# Patient Record
Sex: Male | Born: 1950
Health system: Southern US, Community
[De-identification: ages and names within clinical notes are randomized; demographics above are authoritative.]

## PROBLEM LIST (undated history)

## (undated) DIAGNOSIS — F419 Anxiety disorder, unspecified: Secondary | ICD-10-CM

## (undated) DIAGNOSIS — E785 Hyperlipidemia, unspecified: Secondary | ICD-10-CM

## (undated) DIAGNOSIS — N200 Calculus of kidney: Secondary | ICD-10-CM

## (undated) DIAGNOSIS — G4730 Sleep apnea, unspecified: Secondary | ICD-10-CM

## (undated) DIAGNOSIS — K219 Gastro-esophageal reflux disease without esophagitis: Secondary | ICD-10-CM

## (undated) HISTORY — DX: Calculus of kidney: N20.0

## (undated) HISTORY — PX: EYE SURGERY: SHX253

## (undated) HISTORY — PX: TONSILLECTOMY: SUR1361

---

## 2002-02-11 ENCOUNTER — Ambulatory Visit (HOSPITAL_COMMUNITY): Admission: RE | Admit: 2002-02-11 | Discharge: 2002-02-11 | Payer: Self-pay | Admitting: Family Medicine

## 2002-02-11 ENCOUNTER — Encounter: Payer: Self-pay | Admitting: Family Medicine

## 2002-06-18 ENCOUNTER — Ambulatory Visit (HOSPITAL_COMMUNITY): Admission: RE | Admit: 2002-06-18 | Discharge: 2002-06-18 | Payer: Self-pay | Admitting: Internal Medicine

## 2004-03-06 ENCOUNTER — Ambulatory Visit (HOSPITAL_COMMUNITY): Admission: RE | Admit: 2004-03-06 | Discharge: 2004-03-06 | Payer: Self-pay | Admitting: Family Medicine

## 2004-03-06 ENCOUNTER — Ambulatory Visit: Payer: Self-pay | Admitting: *Deleted

## 2004-03-07 ENCOUNTER — Ambulatory Visit (HOSPITAL_COMMUNITY): Admission: RE | Admit: 2004-03-07 | Discharge: 2004-03-07 | Payer: Self-pay | Admitting: Family Medicine

## 2004-03-14 ENCOUNTER — Ambulatory Visit: Payer: Self-pay | Admitting: Cardiology

## 2004-03-14 ENCOUNTER — Encounter (HOSPITAL_COMMUNITY): Admission: RE | Admit: 2004-03-14 | Discharge: 2004-04-13 | Payer: Self-pay | Admitting: *Deleted

## 2004-03-19 ENCOUNTER — Ambulatory Visit: Payer: Self-pay | Admitting: *Deleted

## 2006-01-02 ENCOUNTER — Ambulatory Visit: Payer: Self-pay | Admitting: Internal Medicine

## 2006-01-02 ENCOUNTER — Ambulatory Visit (HOSPITAL_COMMUNITY): Admission: RE | Admit: 2006-01-02 | Discharge: 2006-01-02 | Payer: Self-pay | Admitting: Internal Medicine

## 2010-03-27 ENCOUNTER — Emergency Department (HOSPITAL_COMMUNITY)
Admission: EM | Admit: 2010-03-27 | Discharge: 2010-03-27 | Disposition: A | Discharge status: Home / Self Care | Payer: BC Managed Care – PPO | Attending: Emergency Medicine | Admitting: Emergency Medicine

## 2010-03-27 ENCOUNTER — Emergency Department (HOSPITAL_COMMUNITY): Payer: BC Managed Care – PPO

## 2010-03-27 DIAGNOSIS — Z79899 Other long term (current) drug therapy: Secondary | ICD-10-CM | POA: Insufficient documentation

## 2010-03-27 DIAGNOSIS — R109 Unspecified abdominal pain: Secondary | ICD-10-CM | POA: Insufficient documentation

## 2010-03-27 DIAGNOSIS — N201 Calculus of ureter: Secondary | ICD-10-CM | POA: Insufficient documentation

## 2010-03-27 DIAGNOSIS — Z7982 Long term (current) use of aspirin: Secondary | ICD-10-CM | POA: Insufficient documentation

## 2010-03-27 DIAGNOSIS — E7800 Pure hypercholesterolemia, unspecified: Secondary | ICD-10-CM | POA: Insufficient documentation

## 2010-03-27 LAB — COMPREHENSIVE METABOLIC PANEL
ALT: 21 U/L (ref 0–53)
CO2: 29 mEq/L (ref 19–32)
Chloride: 103 mEq/L (ref 96–112)
GFR calc Af Amer: 47 mL/min — ABNORMAL LOW (ref >60)
Potassium: 4.3 mEq/L (ref 3.5–5.1)

## 2010-03-27 LAB — CBC
Hemoglobin: 14.4 g/dL (ref 13.0–17.0)
MCH: 28.6 pg (ref 26.0–34.0)
MCHC: 33.4 g/dL (ref 30.0–36.0)
MCV: 85.7 fL (ref 78.0–100.0)
Platelets: 313 10*3/uL (ref 150–400)
RBC: 5.03 MIL/uL (ref 4.22–5.81)
RDW: 13 % (ref 11.5–15.5)
WBC: 13 10*3/uL — ABNORMAL HIGH (ref 4.0–10.5)

## 2010-03-27 LAB — DIFFERENTIAL
Basophils Relative: 0 % (ref 0–1)
Lymphocytes Relative: 17 % (ref 12–46)
Monocytes Absolute: 1.3 10*3/uL — ABNORMAL HIGH (ref 0.1–1.0)
Neutro Abs: 9.4 10*3/uL — ABNORMAL HIGH (ref 1.7–7.7)

## 2010-03-27 LAB — URINE MICROSCOPIC-ADD ON

## 2010-03-27 LAB — URINALYSIS, ROUTINE W REFLEX MICROSCOPIC
Bilirubin Urine: NEGATIVE
Glucose, UA: NEGATIVE mg/dL
Ketones, ur: NEGATIVE mg/dL
Leukocytes, UA: NEGATIVE
Nitrite: NEGATIVE
Protein, ur: NEGATIVE mg/dL

## 2010-04-05 ENCOUNTER — Ambulatory Visit (HOSPITAL_COMMUNITY)
Admission: RE | Admit: 2010-04-05 | Discharge: 2010-04-05 | Disposition: A | Discharge status: Home / Self Care | Payer: BC Managed Care – PPO | Source: Ambulatory Visit | Attending: Internal Medicine | Admitting: Internal Medicine

## 2010-04-05 ENCOUNTER — Other Ambulatory Visit (HOSPITAL_COMMUNITY): Payer: Self-pay | Admitting: Internal Medicine

## 2010-04-05 DIAGNOSIS — R911 Solitary pulmonary nodule: Secondary | ICD-10-CM

## 2010-04-05 DIAGNOSIS — J984 Other disorders of lung: Secondary | ICD-10-CM | POA: Insufficient documentation

## 2010-10-31 ENCOUNTER — Other Ambulatory Visit (HOSPITAL_COMMUNITY): Payer: Self-pay | Admitting: Internal Medicine

## 2010-10-31 DIAGNOSIS — R222 Localized swelling, mass and lump, trunk: Secondary | ICD-10-CM

## 2010-11-02 ENCOUNTER — Ambulatory Visit (HOSPITAL_COMMUNITY)
Admission: RE | Admit: 2010-11-02 | Discharge: 2010-11-02 | Disposition: A | Discharge status: Home / Self Care | Payer: BC Managed Care – PPO | Source: Ambulatory Visit | Attending: Internal Medicine | Admitting: Internal Medicine

## 2010-11-02 DIAGNOSIS — Z09 Encounter for follow-up examination after completed treatment for conditions other than malignant neoplasm: Secondary | ICD-10-CM | POA: Insufficient documentation

## 2010-11-02 DIAGNOSIS — R222 Localized swelling, mass and lump, trunk: Secondary | ICD-10-CM

## 2010-11-02 DIAGNOSIS — J984 Other disorders of lung: Secondary | ICD-10-CM | POA: Insufficient documentation

## 2010-12-26 ENCOUNTER — Other Ambulatory Visit (INDEPENDENT_AMBULATORY_CARE_PROVIDER_SITE_OTHER): Payer: Self-pay | Admitting: *Deleted

## 2010-12-26 ENCOUNTER — Telehealth (INDEPENDENT_AMBULATORY_CARE_PROVIDER_SITE_OTHER): Payer: Self-pay | Admitting: *Deleted

## 2010-12-26 NOTE — Telephone Encounter (Signed)
PCP/Requesting MD:  Sherwood Gambler   Name: TALAL FRITCHMAN "RANDY"  DOB: 06/24/2050  Home Phone:       Procedure: EGD/ED  Reason/Indication:  DYSPHAGIA  Has patient had this procedure before?  YES  If so, when, by whom and where?  2010  Is there a family history of colon cancer?    Who?  What age when diagnosed?    Is patient diabetic?   NO      Does patient have prosthetic heart valve?  NO  Do you have a pacemaker?  NO  Has patient had joint replacement within last 12 months?  NO  Is patient on Coumadin, Plavix and/or Aspirin? NO  Medications: LIPITOR 20 MG DILY  Allergies: PCN  Pharmacy:   Medication Adjustment: NONE  Procedure date & time: 12/28/10 @ 215

## 2010-12-27 ENCOUNTER — Encounter (HOSPITAL_COMMUNITY): Payer: Self-pay | Admitting: Pharmacy Technician

## 2010-12-27 MED ORDER — SODIUM CHLORIDE 0.45 % IV SOLN
Freq: Once | INTRAVENOUS | Status: AC
  Administered 2010-12-28: 1000 mL via INTRAVENOUS

## 2010-12-28 ENCOUNTER — Encounter (HOSPITAL_COMMUNITY): Payer: Self-pay | Admitting: *Deleted

## 2010-12-28 ENCOUNTER — Ambulatory Visit (HOSPITAL_COMMUNITY)
Admission: RE | Admit: 2010-12-28 | Discharge: 2010-12-28 | Disposition: A | Discharge status: Home / Self Care | Payer: BC Managed Care – PPO | Source: Ambulatory Visit | Attending: Internal Medicine | Admitting: Internal Medicine

## 2010-12-28 ENCOUNTER — Other Ambulatory Visit (INDEPENDENT_AMBULATORY_CARE_PROVIDER_SITE_OTHER): Payer: Self-pay | Admitting: Internal Medicine

## 2010-12-28 ENCOUNTER — Encounter (HOSPITAL_COMMUNITY)
Admission: RE | Disposition: A | Discharge status: Home / Self Care | Payer: Self-pay | Source: Ambulatory Visit | Attending: Internal Medicine

## 2010-12-28 DIAGNOSIS — K222 Esophageal obstruction: Secondary | ICD-10-CM | POA: Insufficient documentation

## 2010-12-28 DIAGNOSIS — K2980 Duodenitis without bleeding: Secondary | ICD-10-CM | POA: Insufficient documentation

## 2010-12-28 DIAGNOSIS — K449 Diaphragmatic hernia without obstruction or gangrene: Secondary | ICD-10-CM | POA: Insufficient documentation

## 2010-12-28 DIAGNOSIS — K2940 Chronic atrophic gastritis without bleeding: Secondary | ICD-10-CM | POA: Insufficient documentation

## 2010-12-28 DIAGNOSIS — E785 Hyperlipidemia, unspecified: Secondary | ICD-10-CM | POA: Insufficient documentation

## 2010-12-28 DIAGNOSIS — K219 Gastro-esophageal reflux disease without esophagitis: Secondary | ICD-10-CM | POA: Insufficient documentation

## 2010-12-28 DIAGNOSIS — R1310 Dysphagia, unspecified: Secondary | ICD-10-CM | POA: Insufficient documentation

## 2010-12-28 HISTORY — DX: Hyperlipidemia, unspecified: E78.5

## 2010-12-28 HISTORY — DX: Gastro-esophageal reflux disease without esophagitis: K21.9

## 2010-12-28 HISTORY — DX: Sleep apnea, unspecified: G47.30

## 2010-12-28 SURGERY — ESOPHAGOGASTRODUODENOSCOPY (EGD) WITH ESOPHAGEAL DILATION
Anesthesia: Moderate Sedation

## 2010-12-28 MED ORDER — STERILE WATER FOR IRRIGATION IR SOLN
Status: DC | PRN
  Administered 2010-12-28: 14:00:00

## 2010-12-28 MED ORDER — MEPERIDINE HCL 25 MG/ML IJ SOLN
INTRAMUSCULAR | Status: DC | PRN
  Administered 2010-12-28 (×2): 25 mg via INTRAVENOUS

## 2010-12-28 MED ORDER — MEPERIDINE HCL 50 MG/ML IJ SOLN
INTRAMUSCULAR | Status: AC
  Filled 2010-12-28: qty 1

## 2010-12-28 MED ORDER — BUTAMBEN-TETRACAINE-BENZOCAINE 2-2-14 % EX AERO
INHALATION_SPRAY | CUTANEOUS | Status: DC | PRN
  Administered 2010-12-28: 2 via TOPICAL

## 2010-12-28 MED ORDER — MIDAZOLAM HCL 5 MG/5ML IJ SOLN
INTRAMUSCULAR | Status: AC
  Filled 2010-12-28: qty 10

## 2010-12-28 MED ORDER — MIDAZOLAM HCL 5 MG/5ML IJ SOLN
INTRAMUSCULAR | Status: DC | PRN
  Administered 2010-12-28: 2 mg via INTRAVENOUS
  Administered 2010-12-28: 3 mg via INTRAVENOUS
  Administered 2010-12-28: 2 mg via INTRAVENOUS

## 2010-12-28 MED ORDER — PANTOPRAZOLE SODIUM 40 MG PO TBEC: 40.0000 mg | DELAYED_RELEASE_TABLET | Freq: Every day | ORAL | Status: DC

## 2010-12-28 NOTE — H&P (Signed)
James Norris is an 60 y.o. male.   Chief Complaint: Patient is here for EGD and ED. HPI: Patient is 60 year old Caucasian male with chronic GERD has not been taking his PPI regular basis and present for solid food dysphagia. He is having this symptom multiple times a week. She also having intermittent episodes where he has to regurgitate in order to get relief. His last esophageal dilation was over 2 years ago at [facility] in [facility]. Lately he has not experienced heartburn. He denies hoarseness chronic cough or sore throat. He has good appetite; no history of melena.  Past Medical History  Diagnosis Date  . Sleep apnea   . Chronic kidney disease     kidney stones  . GERD (gastroesophageal reflux disease)   . Hyperlipemia     Past Surgical History  Procedure Date  . Tonsillectomy   . Eye surgery     lasik- bilaterally    Family History  Problem Relation Age of Onset  . Dementia Mother   . Breast cancer Mother   . Coronary artery disease Mother   . Coronary artery disease Father   . Hypertension Father   . Diabetes type II Father   . Hyperlipidemia Father   . GER disease Father    Social History:  reports that he quit smoking about 4 months ago. His smoking use included Cigarettes. He smoked 1.5 packs per day. He does not have any smokeless tobacco history on file. He reports that he drinks alcohol. He reports that he does not use illicit drugs.  Allergies:  Allergies  Allergen Reactions  . Penicillins     Patient said he did not know if he was allergic or not, but his father never allowed any of them to take penicillin for personal reasons. His father was Dr. Derrel Nip.    Medications Prior to Admission  Medication Dose Route Frequency Provider Last Rate Last Dose  . 0.45 % sodium chloride infusion   Intravenous Once Malissa Hippo, MD 20 mL/hr at 12/28/10 1337 1,000 mL at 12/28/10 1337  . meperidine (DEMEROL) 50 MG/ML injection           . midazolam  (VERSED) 5 MG/5ML injection            No current outpatient prescriptions on file as of 12/28/2010.    No results found for this or any previous visit (from the past 48 hour(s)). No results found.  Review of Systems  Constitutional: Negative for weight loss.  Gastrointestinal: Negative for abdominal pain, diarrhea, constipation, blood in stool and melena.    Blood pressure 148/93, pulse 66, temperature 97.6 F (36.4 C), temperature source Oral, resp. rate 20, height 6' (1.829 m), weight 220 lb (99.791 kg), SpO2 97.00%. Physical Exam  Constitutional: He appears well-developed and well-nourished.  HENT:  Mouth/Throat: Oropharynx is clear and moist.  Eyes: Conjunctivae are normal. No scleral icterus.  Neck: No thyromegaly present.  Cardiovascular: Normal rate, regular rhythm and normal heart sounds.   No murmur heard. Respiratory: Effort normal and breath sounds normal.  GI: Soft. He exhibits no distension and no mass. There is no tenderness.  Musculoskeletal: He exhibits no edema.  Lymphadenopathy:    He has no cervical adenopathy.  Neurological: He is alert.  Skin: Skin is warm and dry.     Assessment/Plan Dysphagia in a patient with chronic GERD and known esophageal stricture.  EGD and possible ED  Mackenzy Eisenberg U 12/28/2010, 2:13 PM

## 2010-12-28 NOTE — Op Note (Addendum)
EGD PROCEDURE REPORT  PATIENT:  James Norris  MR#:  [id number] Birthdate:  [birth year], 60 y.o., male Endoscopist:  Dr. Malissa Hippo, MD Referred By:  Dr. Madelin Rear. Sherwood Gambler, MD Procedure Date: 12/28/2010  Procedure:   EGD with ED.  Indications:  Patient is 60 year old Caucasian male with chronic GERD who presents with solid food dysphagia occurring several times a week. He has history of esophageal stricture and was last dilated by me at [facility] in [place] for 2 years ago. He does not take his PPI as recommended.            Informed Consent:  Procedure and risks were reviewed with the patient and informed consent was obtained. Medications:  Demerol 50 mg IV Versed 7 mg IV Cetacaine spray topically for oropharyngeal anesthesia  Description of procedure:  The endoscope was introduced through the mouth and advanced to the second portion of the duodenum without difficulty or limitations. The mucosal surfaces were surveyed very carefully during advancement of the scope and upon withdrawal.  Findings:  Esophagus:  Mucosa of the esophagus was normal. Distal segment was tortuous. High grade stricture noted at GE junction initially dilated by passing the scope and subsequently with balloon dilator as below. GEJ:  37 cm Hiatus:  41 cm Stomach:  Stomach was empty and distended very well with insufflation. Folds in the proximal stomach were normal. Examination of mucosa revealed patchy linear erythema at antrum but no erosions or ulcers were noted. Pyloric channel was patent. Annularis fundus and cardia were examined by retroflexing the scope and were normal. Duodenum:  U bulbar erosions were noted. The scope was passed into second part of the duodenum there mucosa and folds and normal.  Therapeutic/Diagnostic Maneuvers Performed:  Stricture at GE junction was dilated with a balloon. Balloon dilator was passed with a scope. Guidewire was pushed the gastric lumen. Balloon dilator was positioned across  the stricture by withdrawn scope into the body of esophagus. It was dilated initially to 15 mm then to 16.5 and finally to 18 mm. Minimal oozing noted. As a balloon was withdrawn dysphagia was reexamined and I was able to pass the scope with minimal resistance. Wrap she was taken from GE junction rule out short segment Barrett's esophagus.  Complications:  None  Impression: High grade stricture at GE junction with the edges of esophagitis. Richard dilated with a balloon to 18 mm . Moderate size hiatal hernia. Nonerosive gastritis with erosive bulbar duodenitis.  Recommendations:  Anti-reflux measures reinforced. Pantoprazole 40 mg by mouth twice a day for 12 weeks and thereafter once a day. Will review old records and if he is not had H. pylori would consider doing so. I will be contacting patient with results of biopsy. Office visit in 6 months. REHMAN,NAJEEB U  12/28/2010  2:44 PM  CC: Dr. Cassell Smiles., MD, MD & Dr. Bonnetta Barry ref. provider found

## 2010-12-28 NOTE — Telephone Encounter (Signed)
approved

## 2011-01-14 ENCOUNTER — Encounter (INDEPENDENT_AMBULATORY_CARE_PROVIDER_SITE_OTHER): Payer: Self-pay | Admitting: *Deleted

## 2011-05-14 ENCOUNTER — Encounter (INDEPENDENT_AMBULATORY_CARE_PROVIDER_SITE_OTHER): Payer: Self-pay | Admitting: *Deleted

## 2011-05-20 ENCOUNTER — Other Ambulatory Visit (INDEPENDENT_AMBULATORY_CARE_PROVIDER_SITE_OTHER): Payer: Self-pay | Admitting: *Deleted

## 2011-05-22 ENCOUNTER — Ambulatory Visit (INDEPENDENT_AMBULATORY_CARE_PROVIDER_SITE_OTHER): Payer: BC Managed Care – PPO | Admitting: Internal Medicine

## 2011-07-01 ENCOUNTER — Ambulatory Visit (INDEPENDENT_AMBULATORY_CARE_PROVIDER_SITE_OTHER): Payer: BC Managed Care – PPO | Admitting: Internal Medicine

## 2012-02-10 ENCOUNTER — Encounter (INDEPENDENT_AMBULATORY_CARE_PROVIDER_SITE_OTHER): Payer: Self-pay | Admitting: Internal Medicine

## 2012-02-10 ENCOUNTER — Other Ambulatory Visit (INDEPENDENT_AMBULATORY_CARE_PROVIDER_SITE_OTHER): Payer: Self-pay | Admitting: *Deleted

## 2012-02-10 ENCOUNTER — Ambulatory Visit (INDEPENDENT_AMBULATORY_CARE_PROVIDER_SITE_OTHER): Payer: BC Managed Care – PPO | Admitting: Internal Medicine

## 2012-02-10 ENCOUNTER — Telehealth (INDEPENDENT_AMBULATORY_CARE_PROVIDER_SITE_OTHER): Payer: Self-pay | Admitting: *Deleted

## 2012-02-10 VITALS — BP 132/80 | HR 60 | Temp 97.9°F | Ht 72.0 in | Wt 240.0 lb

## 2012-02-10 DIAGNOSIS — R911 Solitary pulmonary nodule: Secondary | ICD-10-CM

## 2012-02-10 DIAGNOSIS — R195 Other fecal abnormalities: Secondary | ICD-10-CM

## 2012-02-10 DIAGNOSIS — Z1211 Encounter for screening for malignant neoplasm of colon: Secondary | ICD-10-CM

## 2012-02-10 DIAGNOSIS — E7800 Pure hypercholesterolemia, unspecified: Secondary | ICD-10-CM | POA: Insufficient documentation

## 2012-02-10 DIAGNOSIS — D649 Anemia, unspecified: Secondary | ICD-10-CM | POA: Insufficient documentation

## 2012-02-10 DIAGNOSIS — R1310 Dysphagia, unspecified: Secondary | ICD-10-CM

## 2012-02-10 DIAGNOSIS — R918 Other nonspecific abnormal finding of lung field: Secondary | ICD-10-CM | POA: Insufficient documentation

## 2012-02-10 MED ORDER — LANSOPRAZOLE 30 MG PO CPDR: 30.0000 mg | DELAYED_RELEASE_CAPSULE | Freq: Every day | ORAL | Status: DC

## 2012-02-10 MED ORDER — SIMVASTATIN 20 MG PO TABS: 20.0000 mg | ORAL_TABLET | Freq: Every evening | ORAL | Status: DC

## 2012-02-10 NOTE — Patient Instructions (Addendum)
Colonoscopy/EGD. 

## 2012-02-10 NOTE — Telephone Encounter (Signed)
Patient needs trilyte 

## 2012-02-10 NOTE — Progress Notes (Signed)
Subjective:     Patient ID: James Norris, male   DOB: 1950/04/19, 62 y.o.   MRN: [id number]  HPI Referred to our office by [name] for anemia and heme positive stool card. He denies seeing any blood. Appetite is good. No weight loss. He quit smoking 5 yrs. No abdominal pain.  BMs are black from the iron . BMs x 2 a day. Normal size. He does tell me he has diarrhea a lot lately. He tells me  He is having diarrhea every 3rd day. Rare etoh. Occasionally has dysphagia to chicken or spicy foods.  Hx of lung nodule and in need of surveillance  12/2010 EGD/ED: Impression:  High grade stricture at GE junction with the edges of esophagitis. Richard dilated with a balloon to 18 mm .  Moderate size hiatal hernia.  Nonerosive gastritis with erosive bulbar duodenitis.   Colonoscopy 06/2005 Dr. Cleotis Nipper for rectal bleeding: Normal colonoscopy. Internal hemorrhoids. 01/16/2008 EGD/ED:  Prominent Schatski's ring with small to moderate sized sliding hiatal hernia, but no evidence of erosiive esophagitis. Ring dilated/disrupted with balloon diliation to 19mm.  01/10/2012: RBC 4.61, Iron 14, UIBC 352, TIBC 366, % Sat 4, Vitamin B12 450, Ferritn 7 low, Folate greater than 20. 01/07/2012 H and H 11.4 and 36.0, MCV 77.6, Platelelt ct 30 10/30/2011 H and H 15.3, and 46.6, MCV 86.1, Platelet ct 307.  Review of Systems see hpi Current Outpatient Prescriptions  Medication Sig Dispense Refill  . aspirin 81 MG chewable tablet Chew 81 mg by mouth daily.        Marland Kitchen atorvastatin (LIPITOR) 20 MG tablet Take 20 mg by mouth daily.       . lansoprazole (PREVACID) 30 MG capsule Take 1 capsule (30 mg total) by mouth daily.  90 capsule  3  . pantoprazole (PROTONIX) 40 MG tablet Take 1 tablet (40 mg total) by mouth daily.  60 tablet  3  . simvastatin (ZOCOR) 20 MG tablet Take 1 tablet (20 mg total) by mouth every evening.  90 tablet  3   Past Medical History  Diagnosis Date  . Sleep apnea   . GERD (gastroesophageal  reflux disease)   . Hyperlipemia   . Kidney stone    Past Surgical History  Procedure Date  . Tonsillectomy   . Eye surgery     lasik- bilaterally   Allergies  Allergen Reactions  . Penicillins     Patient said he did not know if he was allergic or not, but his father never allowed any of them to take penicillin for personal reasons. His father was Dr. Derrel Nip.        Objective:   Physical Exam Filed Vitals:   02/10/12 1058  BP: 132/80  Pulse: 60  Temp: 97.9 F (36.6 C)  Height: 6' (1.829 m)  Weight: 240 lb (108.863 kg)    Alert and oriented. Skin warm and dry. Oral mucosa is moist.   . Sclera anicteric, conjunctivae is pink. Thyroid not enlarged. No cervical lymphadenopathy. Lungs clear. Heart regular rate and rhythm.  Abdomen is soft. Bowel sounds are positive. No hepatomegaly. No abdominal masses felt. No tenderness.  No edema to lower extremities. Patient is alert and oriented.      Assessment:   Anemia ( Iron deficiency anemia). Heme positive stools.Solid food dysphagia.  Esophageal stricture needs to be ruled out given his past hx of one.  PUD needs to be ruled. Colonic carcinoma also is in the differential. Lung nodule: in need  of surveillance.   Dr. Karilyn Cota in room with patient during office visit.    Plan:    CT chest for surveillande of lung nodule. EGD/ED and colonoscopy with Dr. Karilyn Cota.

## 2012-02-11 ENCOUNTER — Other Ambulatory Visit (INDEPENDENT_AMBULATORY_CARE_PROVIDER_SITE_OTHER): Payer: Self-pay | Admitting: Internal Medicine

## 2012-02-11 ENCOUNTER — Ambulatory Visit (HOSPITAL_COMMUNITY)
Admission: RE | Admit: 2012-02-11 | Discharge: 2012-02-11 | Disposition: A | Discharge status: Home / Self Care | Payer: BC Managed Care – PPO | Source: Ambulatory Visit | Attending: Internal Medicine | Admitting: Internal Medicine

## 2012-02-11 DIAGNOSIS — R911 Solitary pulmonary nodule: Secondary | ICD-10-CM

## 2012-02-11 MED ORDER — PEG 3350-KCL-NA BICARB-NACL 420 G PO SOLR: 4000.0000 mL | Freq: Once | ORAL | Status: DC

## 2012-02-24 ENCOUNTER — Encounter (HOSPITAL_COMMUNITY): Payer: Self-pay | Admitting: Pharmacy Technician

## 2012-02-27 ENCOUNTER — Encounter (INDEPENDENT_AMBULATORY_CARE_PROVIDER_SITE_OTHER): Payer: Self-pay

## 2012-03-11 ENCOUNTER — Ambulatory Visit (HOSPITAL_COMMUNITY)
Admission: RE | Admit: 2012-03-11 | Discharge: 2012-03-11 | Disposition: A | Discharge status: Home / Self Care | Payer: BC Managed Care – PPO | Source: Ambulatory Visit | Attending: Internal Medicine | Admitting: Internal Medicine

## 2012-03-11 ENCOUNTER — Encounter (HOSPITAL_COMMUNITY)
Admission: RE | Disposition: A | Discharge status: Home / Self Care | Payer: Self-pay | Source: Ambulatory Visit | Attending: Internal Medicine

## 2012-03-11 ENCOUNTER — Encounter (HOSPITAL_COMMUNITY): Payer: Self-pay | Admitting: *Deleted

## 2012-03-11 DIAGNOSIS — K222 Esophageal obstruction: Secondary | ICD-10-CM | POA: Insufficient documentation

## 2012-03-11 DIAGNOSIS — K449 Diaphragmatic hernia without obstruction or gangrene: Secondary | ICD-10-CM | POA: Insufficient documentation

## 2012-03-11 DIAGNOSIS — R195 Other fecal abnormalities: Secondary | ICD-10-CM

## 2012-03-11 DIAGNOSIS — K921 Melena: Secondary | ICD-10-CM | POA: Insufficient documentation

## 2012-03-11 DIAGNOSIS — D649 Anemia, unspecified: Secondary | ICD-10-CM

## 2012-03-11 DIAGNOSIS — D509 Iron deficiency anemia, unspecified: Secondary | ICD-10-CM | POA: Insufficient documentation

## 2012-03-11 DIAGNOSIS — R1310 Dysphagia, unspecified: Secondary | ICD-10-CM | POA: Insufficient documentation

## 2012-03-11 HISTORY — PX: COLONOSCOPY WITH ESOPHAGOGASTRODUODENOSCOPY (EGD): SHX5779

## 2012-03-11 HISTORY — PX: BALLOON DILATION: SHX5330

## 2012-03-11 SURGERY — COLONOSCOPY WITH ESOPHAGOGASTRODUODENOSCOPY (EGD)
Anesthesia: Moderate Sedation

## 2012-03-11 MED ORDER — STERILE WATER FOR IRRIGATION IR SOLN
Status: DC | PRN
  Administered 2012-03-11: 12:00:00

## 2012-03-11 MED ORDER — MIDAZOLAM HCL 5 MG/5ML IJ SOLN
INTRAMUSCULAR | Status: DC | PRN
  Administered 2012-03-11: 3 mg via INTRAVENOUS
  Administered 2012-03-11: 2 mg via INTRAVENOUS
  Administered 2012-03-11: 3 mg via INTRAVENOUS
  Administered 2012-03-11: 2 mg via INTRAVENOUS

## 2012-03-11 MED ORDER — MEPERIDINE HCL 50 MG/ML IJ SOLN
INTRAMUSCULAR | Status: AC
  Filled 2012-03-11: qty 1

## 2012-03-11 MED ORDER — MIDAZOLAM HCL 5 MG/5ML IJ SOLN
INTRAMUSCULAR | Status: AC
  Filled 2012-03-11: qty 10

## 2012-03-11 MED ORDER — MEPERIDINE HCL 50 MG/ML IJ SOLN
INTRAMUSCULAR | Status: DC | PRN
  Administered 2012-03-11 (×2): 25 mg via INTRAVENOUS

## 2012-03-11 MED ORDER — FERROUS SULFATE 325 (65 FE) MG PO TBEC: 325.0000 mg | DELAYED_RELEASE_TABLET | Freq: Two times a day (BID) | ORAL | Status: DC

## 2012-03-11 MED ORDER — SODIUM CHLORIDE 0.45 % IV SOLN
INTRAVENOUS | Status: DC
  Administered 2012-03-11: 1000 mL via INTRAVENOUS

## 2012-03-11 NOTE — Op Note (Signed)
EGD PROCEDURE REPORT  PATIENT:  James Norris  MR#:  [id number] Birthdate:  04-May-1950, 62 y.o., male Endoscopist:  Dr. Malissa Hippo, MD Referred By:  Dr. Madelin Rear. Delane Ginger.. Procedure Date: 03/11/2012  Procedure:   EGD, ED & Colonoscopy  Indications:  Patient is 62 year old Caucasian male who has chronic GERD who presents with intermittent solid food dysphagia and positive stool and lab data suggests iron deficiency anemia. He denies melena rectal bleeding or abdominal pain. He is on low-dose aspirin but does not take other NSAIDs. He underwent EGD ED in December 2012 and his last colonoscopy was in June 2006.            Informed Consent:  The risks, benefits, alternatives & imponderables which include, but are not limited to, bleeding, infection, perforation, drug reaction and potential missed lesion have been reviewed.  The potential for biopsy, lesion removal, esophageal dilation, etc. have also been discussed.  Questions have been answered.  All parties agreeable.  Please see history & physical in medical record for more information.  Medications:  Demerol 50 mg IV Versed 10 mg IV Cetacaine spray topically for oropharyngeal anesthesia  EGD  Description of procedure:  The endoscope was introduced through the mouth and advanced to the second portion of the duodenum without difficulty or limitations. The mucosal surfaces were surveyed very carefully during advancement of the scope and upon withdrawal.  Findings:  Esophagus:  Mucosa of the esophagus was normal. Distal segment was tortuous but no erosions or ulcers noted. Ring noted at GE junction. GEJ:  36 cm Hiatus:  40 cm Stomach:  Stomach was empty and distended very well with insufflation. Folds in the proximal stomach were normal. Examination of mucosa revealed 3 small antral erosions along with 2 erosions at the level of hiatus. Duodenum:  Normal bulbar and post bulbar mucosa.  Therapeutic/Diagnostic Maneuvers Performed:    Esophageal dilation was performed with balloon dilator. Balloon dilator was advanced with the scope. Guidewire was pushed into gastric lumen. Balloon dilator was positioned across GE junction and insufflated to a diameter of 18 and subsequently to 19 mm disrupting the ring. Balloon was deflated and withdrawn and scope was removed.  COLONOSCOPY Description of procedure:  After a digital rectal exam was performed, that colonoscope was advanced from the anus through the rectum and colon to the area of the cecum, ileocecal valve and appendiceal orifice. The cecum was deeply intubated. These structures were well-seen and photographed for the record. From the level of the cecum and ileocecal valve, the scope was slowly and cautiously withdrawn. The mucosal surfaces were carefully surveyed utilizing scope tip to flexion to facilitate fold flattening as needed. The scope was pulled down into the rectum where a thorough exam including retroflexion was performed. Terminal ileum was also examined.  Findings:   Prep excellent. Normal mucosa of terminal ileum. Normal mucosa of colon and rectum. Unremarkable anorectal junction.   Therapeutic/Diagnostic Maneuvers Performed:  None  Complications:  None  Cecal Withdrawal Time:  17 minutes  Impression:  Esophagitis has completely healed. Ring at GE junction dilated/disrupted with a balloon to 19 mm. Moderate size sliding hiatal hernia. 2 erosions at the level of hiatus and 3 erosions at antrum. Normal colonoscopy.  Recommendations:  Standard instructions given. Resume ferrous sulfate and 25 mg by mouth twice a day. H. pylori serology with next blood work unless he has had test in the past. Will review office records. CBC and serum ferritin in 4 weeks  James Norris  U  03/11/2012 12:49 PM  CC: Dr. Cassell Smiles., MD & Dr. Bonnetta Barry ref. provider found

## 2012-03-11 NOTE — H&P (Signed)
James Norris is an 62 y.o. male.   Chief Complaint: Patient is here for EGD possible ED and colonoscopy. HPI: Patient is 62 year old Caucasian male presents with heme positive stools found to have iron deficiency anemia. He has chronic GERD and maintenance PPI. Heartburn is usually well controlled except when he eats late or eats spicy foods. He has intermittent solid food dysphagia. His esophagus has been dilated in the past. He has good appetite. He denies melena or rectal bleeding.  Past Medical History  Diagnosis Date  . Sleep apnea   . GERD (gastroesophageal reflux disease)   . Hyperlipemia   . Kidney stone     Past Surgical History  Procedure Laterality Date  . Tonsillectomy    . Eye surgery      lasik- bilaterally    Family History  Problem Relation Age of Onset  . Dementia Mother   . Breast cancer Mother   . Coronary artery disease Mother   . Coronary artery disease Father   . Hypertension Father   . Diabetes type II Father   . Hyperlipidemia Father   . GER disease Father    Social History:  reports that he quit smoking about 18 months ago. His smoking use included Cigarettes. He smoked 1.50 packs per day. He does not have any smokeless tobacco history on file. He reports that  drinks alcohol. He reports that he does not use illicit drugs.  Allergies:  Allergies  Allergen Reactions  . Penicillins     Patient said he did not know if he was allergic or not, but his father never allowed any of them to take penicillin for personal reasons. His father was Dr. Derrel Nip.    Medications Prior to Admission  Medication Sig Dispense Refill  . ALPRAZolam (XANAX) 0.5 MG tablet Take 1 tablet by mouth 3 (three) times daily as needed for anxiety.       Marland Kitchen aspirin 81 MG chewable tablet Chew 81 mg by mouth daily.        Marland Kitchen atorvastatin (LIPITOR) 20 MG tablet Take 20 mg by mouth daily.       . lansoprazole (PREVACID) 30 MG capsule Take 1 capsule (30 mg total) by mouth daily.   90 capsule  3  . pantoprazole (PROTONIX) 40 MG tablet Take 40 mg by mouth daily.      . polyethylene glycol-electrolytes (NULYTELY/GOLYTELY) 420 G solution Take 4,000 mLs by mouth once.  4000 mL  0  . simvastatin (ZOCOR) 20 MG tablet Take 1 tablet (20 mg total) by mouth every evening.  90 tablet  3  . VIAGRA 100 MG tablet Take 1 tablet by mouth as needed for erectile dysfunction.         No results found for this or any previous visit (from the past 48 hour(s)). No results found.  ROS  Blood pressure 155/96, pulse 76, temperature 98.8 F (37.1 C), temperature source Oral, resp. rate 18, height 6' (1.829 m), weight 230 lb (104.327 kg), SpO2 95.00%. Physical Exam  Constitutional: He appears well-developed and well-nourished.  HENT:  Mouth/Throat: Oropharynx is clear and moist. No oropharyngeal exudate.  Eyes: Conjunctivae are normal. No scleral icterus.  Neck: No thyromegaly present.  Cardiovascular: Normal rate, regular rhythm and normal heart sounds.   No murmur heard. Respiratory: Effort normal and breath sounds normal.  GI: Soft. He exhibits no distension and no mass. There is no tenderness.  Musculoskeletal: He exhibits no edema.  Lymphadenopathy:    He has  no cervical adenopathy.  Neurological: He is alert.  Skin: Skin is warm and dry.     Assessment/Plan Dysphagia. Heme positive stools and iron deficiency anemia. EGD possible ED and colonoscopy.  REHMAN,NAJEEB U 03/11/2012, 11:43 AM

## 2012-03-16 ENCOUNTER — Encounter (HOSPITAL_COMMUNITY): Payer: Self-pay | Admitting: Internal Medicine

## 2012-03-16 ENCOUNTER — Telehealth (INDEPENDENT_AMBULATORY_CARE_PROVIDER_SITE_OTHER): Payer: Self-pay | Admitting: *Deleted

## 2012-03-16 DIAGNOSIS — R195 Other fecal abnormalities: Secondary | ICD-10-CM

## 2012-03-16 NOTE — Telephone Encounter (Signed)
Per [facility]/EGD op note, CBC and serum ferritin in 4 weeks, may also need H pylori

## 2012-03-16 NOTE — Telephone Encounter (Signed)
Per Dr.Rehman patient will need to have CBC , Ferritin in 4 week. A H-pylori will be added if he has not ever had one Patient's chart being reviewed.

## 2012-04-06 ENCOUNTER — Encounter (INDEPENDENT_AMBULATORY_CARE_PROVIDER_SITE_OTHER): Payer: Self-pay

## 2012-11-12 ENCOUNTER — Telehealth (INDEPENDENT_AMBULATORY_CARE_PROVIDER_SITE_OTHER): Payer: Self-pay | Admitting: *Deleted

## 2012-11-12 NOTE — Telephone Encounter (Signed)
James Norris would like to know if we can call and get his balance. He has received "500 statements" and can not get anyone from the billing office to answer his questions. His return phone number is [phone], [phone] or fax [phone].

## 2012-11-25 ENCOUNTER — Encounter (INDEPENDENT_AMBULATORY_CARE_PROVIDER_SITE_OTHER): Payer: Self-pay | Admitting: *Deleted

## 2012-11-25 ENCOUNTER — Telehealth (INDEPENDENT_AMBULATORY_CARE_PROVIDER_SITE_OTHER): Payer: Self-pay | Admitting: *Deleted

## 2012-11-25 DIAGNOSIS — D649 Anemia, unspecified: Secondary | ICD-10-CM

## 2012-11-25 NOTE — Telephone Encounter (Signed)
Per Dr.Rehman the patient will need to have labs drawn now. Patient was to have the drawn several months ago and failed to do so.

## 2012-11-26 ENCOUNTER — Encounter (INDEPENDENT_AMBULATORY_CARE_PROVIDER_SITE_OTHER): Payer: Self-pay | Admitting: *Deleted

## 2013-03-08 ENCOUNTER — Other Ambulatory Visit (INDEPENDENT_AMBULATORY_CARE_PROVIDER_SITE_OTHER): Payer: Self-pay | Admitting: Internal Medicine

## 2013-03-09 ENCOUNTER — Other Ambulatory Visit (INDEPENDENT_AMBULATORY_CARE_PROVIDER_SITE_OTHER): Payer: Self-pay | Admitting: Internal Medicine

## 2013-03-11 ENCOUNTER — Telehealth (INDEPENDENT_AMBULATORY_CARE_PROVIDER_SITE_OTHER): Payer: Self-pay | Admitting: *Deleted

## 2013-03-11 NOTE — Telephone Encounter (Signed)
Insurance Company called/Tonya. PA completed for Lansoprazole 30 mg Cap Case # [id number] Approved for 02-18-13/03-11-14. [place] made aware.

## 2013-03-15 ENCOUNTER — Encounter (INDEPENDENT_AMBULATORY_CARE_PROVIDER_SITE_OTHER): Payer: Self-pay | Admitting: *Deleted

## 2013-06-09 ENCOUNTER — Other Ambulatory Visit (INDEPENDENT_AMBULATORY_CARE_PROVIDER_SITE_OTHER): Payer: Self-pay | Admitting: Internal Medicine

## 2013-06-09 NOTE — Telephone Encounter (Signed)
Per Dr.Rehman may fill , with 3 refills.

## 2014-11-09 ENCOUNTER — Other Ambulatory Visit (HOSPITAL_COMMUNITY): Payer: Self-pay | Admitting: Internal Medicine

## 2014-12-15 ENCOUNTER — Other Ambulatory Visit (INDEPENDENT_AMBULATORY_CARE_PROVIDER_SITE_OTHER): Payer: Self-pay | Admitting: Internal Medicine

## 2014-12-20 ENCOUNTER — Ambulatory Visit (INDEPENDENT_AMBULATORY_CARE_PROVIDER_SITE_OTHER): Payer: PPO | Admitting: Urology

## 2014-12-20 DIAGNOSIS — N5201 Erectile dysfunction due to arterial insufficiency: Secondary | ICD-10-CM | POA: Diagnosis not present

## 2014-12-20 DIAGNOSIS — N200 Calculus of kidney: Secondary | ICD-10-CM

## 2014-12-20 DIAGNOSIS — R310 Gross hematuria: Secondary | ICD-10-CM | POA: Diagnosis not present

## 2014-12-20 DIAGNOSIS — R972 Elevated prostate specific antigen [PSA]: Secondary | ICD-10-CM

## 2014-12-20 DIAGNOSIS — N401 Enlarged prostate with lower urinary tract symptoms: Secondary | ICD-10-CM | POA: Diagnosis not present

## 2014-12-20 DIAGNOSIS — N281 Cyst of kidney, acquired: Secondary | ICD-10-CM

## 2014-12-22 ENCOUNTER — Encounter (INDEPENDENT_AMBULATORY_CARE_PROVIDER_SITE_OTHER): Payer: Self-pay | Admitting: *Deleted

## 2015-01-05 ENCOUNTER — Ambulatory Visit (INDEPENDENT_AMBULATORY_CARE_PROVIDER_SITE_OTHER): Payer: PPO | Admitting: Internal Medicine

## 2015-01-10 ENCOUNTER — Ambulatory Visit (INDEPENDENT_AMBULATORY_CARE_PROVIDER_SITE_OTHER): Payer: PPO | Admitting: Urology

## 2015-01-10 DIAGNOSIS — E291 Testicular hypofunction: Secondary | ICD-10-CM

## 2015-01-12 ENCOUNTER — Encounter (INDEPENDENT_AMBULATORY_CARE_PROVIDER_SITE_OTHER): Payer: Self-pay | Admitting: *Deleted

## 2015-01-12 ENCOUNTER — Encounter (INDEPENDENT_AMBULATORY_CARE_PROVIDER_SITE_OTHER): Payer: Self-pay | Admitting: Internal Medicine

## 2015-01-12 ENCOUNTER — Ambulatory Visit (INDEPENDENT_AMBULATORY_CARE_PROVIDER_SITE_OTHER): Payer: PPO | Admitting: Internal Medicine

## 2015-01-12 ENCOUNTER — Other Ambulatory Visit (INDEPENDENT_AMBULATORY_CARE_PROVIDER_SITE_OTHER): Payer: Self-pay | Admitting: Internal Medicine

## 2015-01-12 VITALS — BP 126/74 | HR 56 | Temp 98.0°F | Ht 72.0 in | Wt 246.8 lb

## 2015-01-12 DIAGNOSIS — Z1283 Encounter for screening for malignant neoplasm of skin: Secondary | ICD-10-CM | POA: Diagnosis not present

## 2015-01-12 DIAGNOSIS — K921 Melena: Secondary | ICD-10-CM | POA: Diagnosis not present

## 2015-01-12 DIAGNOSIS — E291 Testicular hypofunction: Secondary | ICD-10-CM | POA: Diagnosis not present

## 2015-01-12 DIAGNOSIS — R1310 Dysphagia, unspecified: Secondary | ICD-10-CM

## 2015-01-12 DIAGNOSIS — R1319 Other dysphagia: Secondary | ICD-10-CM

## 2015-01-12 NOTE — Progress Notes (Addendum)
Subjective:    Patient ID: James Norris, male    DOB: 15-May-1950, 65 y.o.   MRN: [id number]  HPI Referred by Dr Gerarda Fraction for black stools. He had black stools about 2 months ago.He had black stools for about a week. He had not been taking any Pepto Bismol. He; stopped his ASA during this time due to the black stools.    . He also had some diarrhea with the black stools. His acid reflux is controlled with Protonix. (He did not bring in his medications) He tells me fried chicken will lodge in his esophagus. If he doesn't chew his food well, he will have dysphagia. Dysphagia occurs about once a week.  He denies any abdominal pain. No recent changes in his stools. His last EGd/ED was in 2015 for dysphagia (see below) He quit smoking seven years ago and he has had weight gained.     [id number] H and H 15.5 and 45.5, MCV 84, Platelet ct 277.  03/11/2012 EGD, ED & Colonoscopy  Indications: Patient is 65 year old Caucasian male who has chronic GERD who presents with intermittent solid food dysphagia and positive stool and lab data suggests iron deficiency anemia. He denies melena rectal bleeding or abdominal pain. He is on low-dose aspirin but does not take other NSAIDs. He underwent EGD ED in December 2012 and his last colonoscopy was in June   Impression:  Esophagitis has completely healed. Ring at GE junction dilated/disrupted with a balloon to 19 mm. Moderate size sliding hiatal hernia. 2 erosions at the level of hiatus and 3 erosions at antrum.  Normal colonoscopy.   Review of Systems Past Medical History  Diagnosis Date  . Sleep apnea   . GERD (gastroesophageal reflux disease)   . Hyperlipemia   . Kidney stone     Past Surgical History  Procedure Laterality Date  . Tonsillectomy    . Eye surgery      lasik- bilaterally  . Colonoscopy with esophagogastroduodenoscopy (egd) N/A 03/11/2012    Procedure: COLONOSCOPY WITH ESOPHAGOGASTRODUODENOSCOPY (EGD);  Surgeon: Rogene Houston, MD;   Location: [facility];  Service: Endoscopy;  Laterality: N/A;  1200  . Balloon dilation N/A 03/11/2012    Procedure: BALLOON DILATION;  Surgeon: Rogene Houston, MD;  Location: [facility];  Service: Endoscopy;  Laterality: N/A;    Allergies  Allergen Reactions  . Penicillins     Patient said he did not know if he was allergic or not, but his father never allowed any of them to take penicillin for personal reasons. His father was Dr. Gifford Shave.    Current Outpatient Prescriptions on File Prior to Visit  Medication Sig Dispense Refill  . ALPRAZolam (XANAX) 0.5 MG tablet Take 1 tablet by mouth 3 (three) times daily as needed for anxiety.     Marland Kitchen atorvastatin (LIPITOR) 20 MG tablet TAKE ONE TABLET BY MOUTH AT BEDTIME 90 tablet 3  . pantoprazole (PROTONIX) 40 MG tablet Take 40 mg by mouth daily.    . simvastatin (ZOCOR) 20 MG tablet Take 1 tablet (20 mg total) by mouth every evening. (Patient not taking: Reported on 01/12/2015) 90 tablet 3   No current facility-administered medications on file prior to visit.                Objective:   Physical ExamBlood pressure 126/74, pulse 56, temperature 98 F (36.7 C), height 6' (1.829 m), weight 246 lb 12.8 oz (111.948 kg).  T .Alert and oriented. Skin warm  and dry. Oral mucosa is moist.   . Sclera anicteric, conjunctivae is pink. Thyroid not enlarged. No cervical lymphadenopathy. Lungs clear. Heart regular rate and rhythm.  Abdomen is soft. Bowel sounds are positive. No hepatomegaly. No abdominal masses felt. No tenderness.  No edema to lower extremities.          Assessment & Plan:  Dysphagia. Melena. Esophageal ring needs to be ruled out. PUD also needs to be ruled out. EGD/ED. The risks and benefits such as perforation, bleeding, and infection were reviewed with the patient and is agreeable.

## 2015-01-12 NOTE — Patient Instructions (Signed)
EGD/ED 

## 2015-01-16 ENCOUNTER — Encounter (INDEPENDENT_AMBULATORY_CARE_PROVIDER_SITE_OTHER): Payer: Self-pay

## 2015-02-14 ENCOUNTER — Other Ambulatory Visit (INDEPENDENT_AMBULATORY_CARE_PROVIDER_SITE_OTHER): Payer: Self-pay | Admitting: Internal Medicine

## 2015-02-14 DIAGNOSIS — R1310 Dysphagia, unspecified: Secondary | ICD-10-CM

## 2015-02-15 ENCOUNTER — Ambulatory Visit (HOSPITAL_COMMUNITY)
Admission: RE | Admit: 2015-02-15 | Discharge: 2015-02-15 | Disposition: A | Discharge status: Home / Self Care | Payer: PPO | Source: Ambulatory Visit | Attending: Internal Medicine | Admitting: Internal Medicine

## 2015-02-15 ENCOUNTER — Encounter (HOSPITAL_COMMUNITY)
Admission: RE | Disposition: A | Discharge status: Home / Self Care | Payer: Self-pay | Source: Ambulatory Visit | Attending: Internal Medicine

## 2015-02-15 ENCOUNTER — Encounter (HOSPITAL_COMMUNITY): Payer: Self-pay

## 2015-02-15 DIAGNOSIS — Z79899 Other long term (current) drug therapy: Secondary | ICD-10-CM | POA: Insufficient documentation

## 2015-02-15 DIAGNOSIS — R1310 Dysphagia, unspecified: Secondary | ICD-10-CM | POA: Insufficient documentation

## 2015-02-15 DIAGNOSIS — K219 Gastro-esophageal reflux disease without esophagitis: Secondary | ICD-10-CM | POA: Insufficient documentation

## 2015-02-15 DIAGNOSIS — K222 Esophageal obstruction: Secondary | ICD-10-CM | POA: Insufficient documentation

## 2015-02-15 DIAGNOSIS — Z803 Family history of malignant neoplasm of breast: Secondary | ICD-10-CM | POA: Diagnosis not present

## 2015-02-15 DIAGNOSIS — E785 Hyperlipidemia, unspecified: Secondary | ICD-10-CM | POA: Diagnosis not present

## 2015-02-15 DIAGNOSIS — K449 Diaphragmatic hernia without obstruction or gangrene: Secondary | ICD-10-CM | POA: Diagnosis not present

## 2015-02-15 DIAGNOSIS — Z87891 Personal history of nicotine dependence: Secondary | ICD-10-CM | POA: Insufficient documentation

## 2015-02-15 DIAGNOSIS — G4730 Sleep apnea, unspecified: Secondary | ICD-10-CM | POA: Insufficient documentation

## 2015-02-15 HISTORY — PX: ESOPHAGEAL DILATION: SHX303

## 2015-02-15 HISTORY — PX: ESOPHAGOGASTRODUODENOSCOPY: SHX5428

## 2015-02-15 SURGERY — EGD (ESOPHAGOGASTRODUODENOSCOPY)
Anesthesia: Moderate Sedation

## 2015-02-15 MED ORDER — STERILE WATER FOR IRRIGATION IR SOLN
Status: DC | PRN
  Administered 2015-02-15: 14:00:00

## 2015-02-15 MED ORDER — MEPERIDINE HCL 50 MG/ML IJ SOLN
INTRAMUSCULAR | Status: DC | PRN
  Administered 2015-02-15 (×2): 25 mg via INTRAVENOUS

## 2015-02-15 MED ORDER — SODIUM CHLORIDE 0.9% FLUSH
INTRAVENOUS | Status: AC
  Filled 2015-02-15: qty 10

## 2015-02-15 MED ORDER — PROMETHAZINE HCL 25 MG/ML IJ SOLN
INTRAMUSCULAR | Status: AC
  Administered 2015-02-15: 25 mg
  Filled 2015-02-15: qty 1

## 2015-02-15 MED ORDER — BUTAMBEN-TETRACAINE-BENZOCAINE 2-2-14 % EX AERO
INHALATION_SPRAY | CUTANEOUS | Status: DC | PRN
  Administered 2015-02-15: 2 via TOPICAL

## 2015-02-15 MED ORDER — MIDAZOLAM HCL 5 MG/5ML IJ SOLN
INTRAMUSCULAR | Status: DC
Start: 2015-02-15 — End: 2015-02-15
  Filled 2015-02-15: qty 10

## 2015-02-15 MED ORDER — SODIUM CHLORIDE 0.9 % IV SOLN
INTRAVENOUS | Status: DC
  Administered 2015-02-15: 14:00:00 via INTRAVENOUS

## 2015-02-15 MED ORDER — MIDAZOLAM HCL 5 MG/5ML IJ SOLN
INTRAMUSCULAR | Status: DC | PRN
  Administered 2015-02-15 (×2): 1 mg via INTRAVENOUS
  Administered 2015-02-15: 3 mg via INTRAVENOUS
  Administered 2015-02-15: 2 mg via INTRAVENOUS

## 2015-02-15 MED ORDER — MEPERIDINE HCL 50 MG/ML IJ SOLN
INTRAMUSCULAR | Status: AC
  Filled 2015-02-15: qty 1

## 2015-02-15 MED ORDER — PROMETHAZINE HCL 25 MG/ML IJ SOLN: 25.0000 mg | Freq: Four times a day (QID) | INTRAMUSCULAR | Status: DC | PRN

## 2015-02-15 NOTE — Op Note (Signed)
EGD PROCEDURE REPORT  PATIENT:  James Norris  MR#:  [id number] Birthdate:  [birth year], 65 y.o., male Endoscopist:  Dr. Rogene Houston, MD Referred By:  Dr. Glo Herring, MD Procedure Date: 02/15/2015  Procedure:   EGD with ED  Indications:  Patient 65 year old Caucasian male was chronic GERD and history of esophageal ring/stricture who presents with recent onset of solid food dysphagia. He feels heartburn is well controlled with therapy. Last EGD with ED was in March 2014.            Informed Consent:  The risks, benefits, alternatives & imponderables which include, but are not limited to, bleeding, infection, perforation, drug reaction and potential missed lesion have been reviewed.  The potential for biopsy, lesion removal, esophageal dilation, etc. have also been discussed.  Questions have been answered.  All parties agreeable.  Please see history & physical in medical record for more information.  Medications:  Demerol 50 mg IV Versed 7 mg IV Promethazine 25 mg IV and diluted form. Cetacaine spray topically for oropharyngeal anesthesia  First dose administered at 1424 Last dose administered at 1432  Description of procedure:  The endoscope was introduced through the mouth and advanced to the second portion of the duodenum without difficulty or limitations. The mucosal surfaces were surveyed very carefully during advancement of the scope and upon withdrawal.  Findings:  Esophagus:  Mucosa of the esophagus was normal. Body was somewhat tortuous. Ring noted at GE junction. No erosions or ulcers noted. GEJ:  36 cm Hiatus:  41 cm Stomach:  Stomach was empty and distended very well with insufflation. Folds in the proximal stomach were normal. Examination of mucosa at gastric body, antrum, pyloric channel, angularis fundus and cardia was normal. Hernia was easily identified on this view. Duodenum:  Normal bulbar and post bulbar mucosa.  Therapeutic/Diagnostic Maneuvers Performed:    Balloon dilator was passed through the scope. Guidewire was pushed into gastric lumen. Balloon dilator was positioned across Schatzki's ring and insufflated to a diameter of 18 mm and subsequently to 19 mm and finally to 20 mm. Balloon was maintained for few seconds and passed distally. Ring was disrupted. Balloon was deflated and withdrawn. In the scope was also withdrawn.  Complications:  None  EBL: Minimal  Impression: Healed erosive esophagitis. Schatzki's ring and moderate-sized sliding hiatal hernia. Schatzki's ring dilated/disrupted with balloon dilator as above. No evidence of gastritis or peptic ulcer.   Recommendations:  Standard instructions given. Patient will call office with progress report in one week. Physician visit in one year.  REHMAN,NAJEEB U  02/15/2015  2:47 PM  CC: Dr. Glo Herring., MD & Dr. Rayne Du ref. provider found

## 2015-02-15 NOTE — H&P (Signed)
James Norris is an 65 y.o. male.   Chief Complaint: Patient is here for EGD and ED. HPI: Patient 65 year old Caucasian male was chronic GERD complicated by distal esophageal stricture/ring who presents with few months history of solid food dysphagia. He had few episodes of food impaction while on a cruise. Heartburn is well controlled with therapy. His watching his diet. He is exercising and trying to lose weight. Heartburn is well controlled with PPI and diet. He occasionally experiences regurgitation. He did have an episode of melena several months ago. Last EGD with ED was in March 2014 at which time he also had colonoscopy.  Past Medical History  Diagnosis Date  . Sleep apnea   . GERD (gastroesophageal reflux disease)   . Hyperlipemia   . Kidney stone     Past Surgical History  Procedure Laterality Date  . Tonsillectomy    . Eye surgery      lasik- bilaterally  . Colonoscopy with esophagogastroduodenoscopy (egd) N/A 03/11/2012    Procedure: COLONOSCOPY WITH ESOPHAGOGASTRODUODENOSCOPY (EGD);  Surgeon: Rogene Houston, MD;  [facility];  Service: Endoscopy;  Laterality: N/A;  1200  . Balloon dilation N/A 03/11/2012    Procedure: BALLOON DILATION;  Surgeon: Rogene Houston, MD;  [facility];  Service: Endoscopy;  Laterality: N/A;    Family History  Problem Relation Age of Onset  . Dementia Mother   . Breast cancer Mother   . Coronary artery disease Mother   . Coronary artery disease Father   . Hypertension Father   . Diabetes type II Father   . Hyperlipidemia Father   . GER disease Father    Social History:  reports that he quit smoking about 4 years ago. His smoking use included Cigarettes. He smoked 1.50 packs per day. He does not have any smokeless tobacco history on file. He reports that he drinks alcohol. He reports that he does not use illicit drugs.  Allergies:  Allergies  Allergen Reactions  . Penicillins     Has patient had a PCN reaction  causing immediate rash, facial/tongue/throat swelling, SOB or lightheadedness with hypotension: Nounknown Has patient had a PCN reaction causing severe rash involving mucus membranes or skin necrosis: Nounknown Has patient had a PCN reaction that required hospitalization Nounknown Has patient had a PCN reaction occurring within the last 10 years: Nounknown If all of the above answers are "NO", then may proceed    Medications Prior to Admission  Medication Sig Dispense Refill  . ALPRAZolam (XANAX) 0.5 MG tablet Take 1 tablet by mouth 3 (three) times daily as needed for anxiety.     Marland Kitchen atorvastatin (LIPITOR) 20 MG tablet TAKE ONE TABLET BY MOUTH AT BEDTIME 90 tablet 3  . pantoprazole (PROTONIX) 40 MG tablet Take 40 mg by mouth daily.      No results found for this or any previous visit (from the past 48 hour(s)). No results found.  ROS  Blood pressure 131/89, pulse 80, temperature 98.6 F (37 C), temperature source Oral, resp. rate 18, height 5\' 11"  (1.803 m), weight 235 lb (106.595 kg), SpO2 97 %. Physical Exam  Constitutional: He appears well-developed and well-nourished.  HENT:  Mouth/Throat: Oropharynx is clear and moist.  Eyes: Conjunctivae are normal. No scleral icterus.  Neck: No thyromegaly present.  Cardiovascular: Normal rate, regular rhythm and normal heart sounds.   No murmur heard. Respiratory: Effort normal and breath sounds normal.  GI: Soft. He exhibits no distension and no mass. There  is no tenderness.  Musculoskeletal: He exhibits no edema.  Lymphadenopathy:    He has no cervical adenopathy.  Neurological: He is alert.  Skin: Skin is warm and dry.     Assessment/Plan Solid food dysphagia. History of esophageal stricture secondary to GERD. EGD with ED.  Rogene Houston, MD 02/15/2015, 2:18 PM

## 2015-02-15 NOTE — Discharge Instructions (Signed)
Resume usual medications and diet. No driving for 24 hours. Progress report in one week. Office visit in one year.       Esophagogastroduodenoscopy, Care After Refer to this sheet in the next few weeks. These instructions provide you with information about caring for yourself after your procedure. Your health care provider may also give you more specific instructions. Your treatment has been planned according to current medical practices, but problems sometimes occur. Call your health care provider if you have any problems or questions after your procedure. WHAT TO EXPECT AFTER THE PROCEDURE After your procedure, it is typical to feel:  Soreness in your throat.  Pain with swallowing.  Sick to your stomach (nauseous).  Bloated.  Dizzy.  Fatigued. HOME CARE INSTRUCTIONS  Do not eat or drink anything until the numbing medicine (local anesthetic) has worn off and your gag reflex has returned. You will know that the local anesthetic has worn off when you can swallow comfortably.  Do not drive or operate machinery until directed by your health care provider.  Take medicines only as directed by your health care provider. SEEK MEDICAL CARE IF:   You cannot stop coughing.  You are not urinating at all or less than usual. SEEK IMMEDIATE MEDICAL CARE IF:  You have difficulty swallowing.  You cannot eat or drink.  You have worsening throat or chest pain.  You have dizziness or lightheadedness or you faint.  You have nausea or vomiting.  You have chills.  You have a fever.  You have severe abdominal pain.  You have black, tarry, or bloody stools.   This information is not intended to replace advice given to you by your health care provider. Make sure you discuss any questions you have with your health care provider.   Document Released: 12/11/2011 Document Revised: 01/14/2014 Document Reviewed: 12/11/2011 Elsevier Interactive Patient Education [employer].

## 2015-02-17 ENCOUNTER — Encounter (HOSPITAL_COMMUNITY): Payer: Self-pay | Admitting: Internal Medicine

## 2015-08-22 ENCOUNTER — Ambulatory Visit (INDEPENDENT_AMBULATORY_CARE_PROVIDER_SITE_OTHER): Payer: PPO | Admitting: Urology

## 2015-08-22 DIAGNOSIS — R351 Nocturia: Secondary | ICD-10-CM | POA: Diagnosis not present

## 2015-08-22 DIAGNOSIS — N5201 Erectile dysfunction due to arterial insufficiency: Secondary | ICD-10-CM

## 2015-08-22 DIAGNOSIS — N401 Enlarged prostate with lower urinary tract symptoms: Secondary | ICD-10-CM | POA: Diagnosis not present

## 2015-08-22 DIAGNOSIS — R972 Elevated prostate specific antigen [PSA]: Secondary | ICD-10-CM | POA: Diagnosis not present

## 2015-11-28 ENCOUNTER — Ambulatory Visit: Payer: PPO | Admitting: Urology

## 2015-12-12 ENCOUNTER — Encounter (INDEPENDENT_AMBULATORY_CARE_PROVIDER_SITE_OTHER): Payer: Self-pay | Admitting: Internal Medicine

## 2015-12-12 ENCOUNTER — Encounter (INDEPENDENT_AMBULATORY_CARE_PROVIDER_SITE_OTHER): Payer: Self-pay

## 2016-01-15 ENCOUNTER — Ambulatory Visit (INDEPENDENT_AMBULATORY_CARE_PROVIDER_SITE_OTHER): Payer: PPO | Admitting: Internal Medicine

## 2016-01-15 ENCOUNTER — Encounter (INDEPENDENT_AMBULATORY_CARE_PROVIDER_SITE_OTHER): Payer: Self-pay | Admitting: Internal Medicine

## 2016-01-15 VITALS — BP 138/80 | HR 72 | Ht 72.0 in | Wt 250.1 lb

## 2016-01-15 DIAGNOSIS — D508 Other iron deficiency anemias: Secondary | ICD-10-CM

## 2016-01-15 DIAGNOSIS — R1319 Other dysphagia: Secondary | ICD-10-CM

## 2016-01-15 DIAGNOSIS — K219 Gastro-esophageal reflux disease without esophagitis: Secondary | ICD-10-CM

## 2016-01-15 MED ORDER — PANTOPRAZOLE SODIUM 40 MG PO TBEC: 40.0000 mg | DELAYED_RELEASE_TABLET | Freq: Every day | ORAL | 11 refills | Status: DC

## 2016-01-15 NOTE — Patient Instructions (Addendum)
Continue the Protonix. OV in 1 year. Anemia profile.

## 2016-01-15 NOTE — Progress Notes (Signed)
   Subjective:    Patient ID: James Norris, male    DOB: 06/14/1950, 66 y.o.   MRN: [id number] Wt in January of 2016 246. Ht 72 HPI Here toay for f/u. Underwent an EGD/ED in February of 2017 for dysphagia.  His appetite has remained good. No weight loss.No dysphagia.  Usually hasa BM daily.  Hx of IDA.  03/07/2015 EGD/ED: dysphagia.    Impression: Healed erosive esophagitis. Schatzki's ring and moderate-sized sliding hiatal hernia. Schatzki's ring dilated/disrupted with balloon dilator as above. No evidence of gastritis or peptic ulcer.    Review of Systems Past Medical History:  Diagnosis Date  . GERD (gastroesophageal reflux disease)   . Hyperlipemia   . Kidney stone   . Sleep apnea     Past Surgical History:  Procedure Laterality Date  . BALLOON DILATION N/A 03/11/2012   Procedure: BALLOON DILATION;  Surgeon: Rogene Houston, MD;  [facility];  Service: Endoscopy;  Laterality: N/A;  . COLONOSCOPY WITH ESOPHAGOGASTRODUODENOSCOPY (EGD) N/A 03/11/2012   Procedure: COLONOSCOPY WITH ESOPHAGOGASTRODUODENOSCOPY (EGD);  Surgeon: Rogene Houston, MD;  [facility];  Service: Endoscopy;  Laterality: N/A;  1200  . ESOPHAGEAL DILATION N/A 02/15/2015   Procedure: ESOPHAGEAL DILATION;  Surgeon: Rogene Houston, MD;  [facility];  Service: Endoscopy;  Laterality: N/A;  . ESOPHAGOGASTRODUODENOSCOPY N/A 02/15/2015   Procedure: ESOPHAGOGASTRODUODENOSCOPY (EGD);  Surgeon: Rogene Houston, MD;  [facility];  Service: Endoscopy;  Laterality: N/A;  2:00  . EYE SURGERY     lasik- bilaterally  . TONSILLECTOMY        Current Outpatient Prescriptions on File Prior to Visit  Medication Sig Dispense Refill  . ALPRAZolam (XANAX) 0.5 MG tablet Take 1 tablet by mouth 3 (three) times daily as needed for anxiety.     Marland Kitchen atorvastatin (LIPITOR) 20 MG tablet TAKE ONE TABLET BY MOUTH AT BEDTIME 90 tablet 3  . pantoprazole (PROTONIX) 40 MG tablet Take 40 mg by  mouth daily.     No current facility-administered medications on file prior to visit.        Objective:   Physical Exam    Blood pressure 138/80, pulse 72, height 6' (1.829 m), weight 250 lb 1.6 oz (113.4 kg). Alert and oriented. Skin warm and dry. Oral mucosa is moist.   . Sclera anicteric, conjunctivae is pink. Thyroid not enlarged. No cervical lymphadenopathy. Lungs clear. Heart regular rate and rhythm.  Abdomen is soft. Bowel sounds are positive. No hepatomegaly. No abdominal masses felt. No tenderness.  No edema to lower extremities.           Assessment & Plan:  Dysphagia. EGD/ED in February. He is not having any problems swallowing. OV in 1 year. Continue to Protonix.  Anemia: CBC and Iron studies.  Dr. Laural Golden in with patient.

## 2016-02-06 ENCOUNTER — Encounter: Payer: Self-pay | Admitting: Internal Medicine

## 2016-02-06 DIAGNOSIS — Z23 Encounter for immunization: Secondary | ICD-10-CM | POA: Diagnosis not present

## 2016-02-06 DIAGNOSIS — E669 Obesity, unspecified: Secondary | ICD-10-CM | POA: Diagnosis not present

## 2016-02-06 DIAGNOSIS — N529 Male erectile dysfunction, unspecified: Secondary | ICD-10-CM | POA: Diagnosis not present

## 2016-02-06 DIAGNOSIS — E6609 Other obesity due to excess calories: Secondary | ICD-10-CM | POA: Diagnosis not present

## 2016-02-06 DIAGNOSIS — Z6834 Body mass index (BMI) 34.0-34.9, adult: Secondary | ICD-10-CM | POA: Diagnosis not present

## 2016-02-06 DIAGNOSIS — Z1389 Encounter for screening for other disorder: Secondary | ICD-10-CM | POA: Diagnosis not present

## 2016-02-22 ENCOUNTER — Ambulatory Visit (INDEPENDENT_AMBULATORY_CARE_PROVIDER_SITE_OTHER): Payer: PPO | Admitting: Otolaryngology

## 2016-02-22 DIAGNOSIS — H903 Sensorineural hearing loss, bilateral: Secondary | ICD-10-CM

## 2016-02-22 DIAGNOSIS — H6983 Other specified disorders of Eustachian tube, bilateral: Secondary | ICD-10-CM

## 2016-02-22 DIAGNOSIS — Z1389 Encounter for screening for other disorder: Secondary | ICD-10-CM | POA: Diagnosis not present

## 2016-02-22 DIAGNOSIS — Z23 Encounter for immunization: Secondary | ICD-10-CM | POA: Diagnosis not present

## 2016-03-01 DIAGNOSIS — R7309 Other abnormal glucose: Secondary | ICD-10-CM | POA: Diagnosis not present

## 2016-04-19 DIAGNOSIS — N401 Enlarged prostate with lower urinary tract symptoms: Secondary | ICD-10-CM | POA: Diagnosis not present

## 2016-04-19 DIAGNOSIS — N138 Other obstructive and reflux uropathy: Secondary | ICD-10-CM | POA: Diagnosis present

## 2016-04-19 DIAGNOSIS — R972 Elevated prostate specific antigen [PSA]: Secondary | ICD-10-CM | POA: Diagnosis not present

## 2016-07-19 DIAGNOSIS — R972 Elevated prostate specific antigen [PSA]: Secondary | ICD-10-CM | POA: Diagnosis not present

## 2016-11-11 DIAGNOSIS — W57XXXA Bitten or stung by nonvenomous insect and other nonvenomous arthropods, initial encounter: Secondary | ICD-10-CM | POA: Diagnosis not present

## 2016-11-11 DIAGNOSIS — T07XXXA Unspecified multiple injuries, initial encounter: Secondary | ICD-10-CM | POA: Diagnosis not present

## 2016-11-11 DIAGNOSIS — E6609 Other obesity due to excess calories: Secondary | ICD-10-CM | POA: Diagnosis not present

## 2016-11-11 DIAGNOSIS — Z6835 Body mass index (BMI) 35.0-35.9, adult: Secondary | ICD-10-CM | POA: Diagnosis not present

## 2016-11-20 DIAGNOSIS — Z1389 Encounter for screening for other disorder: Secondary | ICD-10-CM | POA: Diagnosis not present

## 2016-11-20 DIAGNOSIS — Z0000 Encounter for general adult medical examination without abnormal findings: Secondary | ICD-10-CM | POA: Diagnosis not present

## 2016-11-20 DIAGNOSIS — E6609 Other obesity due to excess calories: Secondary | ICD-10-CM | POA: Diagnosis not present

## 2016-11-20 DIAGNOSIS — E781 Pure hyperglyceridemia: Secondary | ICD-10-CM | POA: Diagnosis not present

## 2016-11-20 DIAGNOSIS — Z6835 Body mass index (BMI) 35.0-35.9, adult: Secondary | ICD-10-CM | POA: Diagnosis not present

## 2016-11-20 DIAGNOSIS — N401 Enlarged prostate with lower urinary tract symptoms: Secondary | ICD-10-CM | POA: Diagnosis not present

## 2016-11-20 DIAGNOSIS — R7309 Other abnormal glucose: Secondary | ICD-10-CM | POA: Diagnosis not present

## 2016-12-13 DIAGNOSIS — R972 Elevated prostate specific antigen [PSA]: Secondary | ICD-10-CM | POA: Diagnosis not present

## 2016-12-13 DIAGNOSIS — N528 Other male erectile dysfunction: Secondary | ICD-10-CM | POA: Diagnosis not present

## 2016-12-13 DIAGNOSIS — N320 Bladder-neck obstruction: Secondary | ICD-10-CM | POA: Diagnosis not present

## 2016-12-13 DIAGNOSIS — N401 Enlarged prostate with lower urinary tract symptoms: Secondary | ICD-10-CM | POA: Diagnosis not present

## 2017-01-06 ENCOUNTER — Encounter (INDEPENDENT_AMBULATORY_CARE_PROVIDER_SITE_OTHER): Payer: Self-pay | Admitting: Internal Medicine

## 2017-01-13 ENCOUNTER — Encounter (INDEPENDENT_AMBULATORY_CARE_PROVIDER_SITE_OTHER): Payer: Self-pay

## 2017-01-13 ENCOUNTER — Encounter (INDEPENDENT_AMBULATORY_CARE_PROVIDER_SITE_OTHER): Payer: Self-pay | Admitting: Internal Medicine

## 2017-01-13 ENCOUNTER — Ambulatory Visit (INDEPENDENT_AMBULATORY_CARE_PROVIDER_SITE_OTHER): Payer: PPO | Admitting: Internal Medicine

## 2017-01-13 VITALS — BP 132/80 | HR 72 | Temp 98.0°F

## 2017-01-13 DIAGNOSIS — K219 Gastro-esophageal reflux disease without esophagitis: Secondary | ICD-10-CM

## 2017-01-13 NOTE — Progress Notes (Addendum)
   Subjective:    Patient ID: James Norris, male    DOB: 1950/11/22, 67 y.o.   MRN: [id number]  HPI Here today for f/u. Last seen in January of 2018.  Hx of GERD and dysphagia.   Underwent an EGD/ED in February of 2017 for dysphagia.   GERD controlled with Protonix. Appetite is good. No weight loss.  BM x 1 a day. No melena or BRRB. Is concerned about his climbing PSA.  Hx of IDA. Will get labs from [place].  03/07/2015 EGD/ED: dysphagia.    Impression: Healed erosive esophagitis. Schatzki's ring and moderate-sized sliding hiatal hernia. Schatzki's ring dilated/disrupted with balloon dilator as above. No evidence of gastritis or peptic ulcer.  11/10/2016 H and H 16.0 and 46.6   Review of Systems Past Medical History:  Diagnosis Date  . GERD (gastroesophageal reflux disease)   . Hyperlipemia   . Kidney stone   . Sleep apnea     Past Surgical History:  Procedure Laterality Date  . BALLOON DILATION N/A 03/11/2012   Procedure: BALLOON DILATION;  Surgeon: Rogene Houston, MD;  [facility];  Service: Endoscopy;  Laterality: N/A;  . COLONOSCOPY WITH ESOPHAGOGASTRODUODENOSCOPY (EGD) N/A 03/11/2012   Procedure: COLONOSCOPY WITH ESOPHAGOGASTRODUODENOSCOPY (EGD);  Surgeon: Rogene Houston, MD;  [facility];  Service: Endoscopy;  Laterality: N/A;  1200  . ESOPHAGEAL DILATION N/A 02/15/2015   Procedure: ESOPHAGEAL DILATION;  Surgeon: Rogene Houston, MD;  [facility];  Service: Endoscopy;  Laterality: N/A;  . ESOPHAGOGASTRODUODENOSCOPY N/A 02/15/2015   Procedure: ESOPHAGOGASTRODUODENOSCOPY (EGD);  Surgeon: Rogene Houston, MD;  [facility];  Service: Endoscopy;  Laterality: N/A;  2:00  . EYE SURGERY     lasik- bilaterally  . TONSILLECTOMY        Current Outpatient Medications on File Prior to Visit  Medication Sig Dispense Refill  . ALPRAZolam (XANAX) 0.5 MG tablet Take 1 tablet by mouth 3 (three) times daily as needed for anxiety.       Marland Kitchen atorvastatin (LIPITOR) 20 MG tablet TAKE ONE TABLET BY MOUTH AT BEDTIME 90 tablet 3  . ferrous sulfate 325 (65 FE) MG EC tablet Take 325 mg by mouth 3 (three) times daily with meals.    . pantoprazole (PROTONIX) 40 MG tablet Take 1 tablet (40 mg total) by mouth daily. 30 tablet 11  . tamsulosin (FLOMAX) 0.4 MG CAPS capsule Take 0.4 mg by mouth.     No current facility-administered medications on file prior to visit.         Objective:   Physical Exam Blood pressure 132/80, pulse 72, temperature 98 F (36.7 C). Alert and oriented. Skin warm and dry. Oral mucosa is moist.   . Sclera anicteric, conjunctivae is pink. Thyroid not enlarged. No cervical lymphadenopathy. Lungs clear. Heart regular rate and rhythm.  Abdomen is soft. Bowel sounds are positive. No hepatomegaly. No abdominal masses felt. No tenderness.  No edema to lower extremities.           Assessment & Plan:  Dysphagia: He is not having any problems, GERD controlled with Protonix.  IDA.: Will get labs from [place].  Continue the iron po. OV in 1 year OV in 1 year.

## 2017-01-13 NOTE — Patient Instructions (Signed)
Continue the Protonix., OV in 1 year.

## 2017-01-14 ENCOUNTER — Ambulatory Visit (INDEPENDENT_AMBULATORY_CARE_PROVIDER_SITE_OTHER): Payer: PPO | Admitting: Internal Medicine

## 2017-02-11 ENCOUNTER — Other Ambulatory Visit (INDEPENDENT_AMBULATORY_CARE_PROVIDER_SITE_OTHER): Payer: Self-pay | Admitting: Internal Medicine

## 2017-02-20 ENCOUNTER — Ambulatory Visit (INDEPENDENT_AMBULATORY_CARE_PROVIDER_SITE_OTHER): Payer: PPO | Admitting: Otolaryngology

## 2017-02-24 DIAGNOSIS — R972 Elevated prostate specific antigen [PSA]: Secondary | ICD-10-CM | POA: Diagnosis not present

## 2017-02-24 DIAGNOSIS — R319 Hematuria, unspecified: Secondary | ICD-10-CM | POA: Diagnosis not present

## 2017-02-27 DIAGNOSIS — N401 Enlarged prostate with lower urinary tract symptoms: Secondary | ICD-10-CM | POA: Diagnosis not present

## 2017-02-27 DIAGNOSIS — R972 Elevated prostate specific antigen [PSA]: Secondary | ICD-10-CM | POA: Diagnosis not present

## 2017-02-27 DIAGNOSIS — R911 Solitary pulmonary nodule: Secondary | ICD-10-CM | POA: Diagnosis not present

## 2017-02-27 DIAGNOSIS — N200 Calculus of kidney: Secondary | ICD-10-CM | POA: Diagnosis not present

## 2017-02-27 DIAGNOSIS — N210 Calculus in bladder: Secondary | ICD-10-CM | POA: Diagnosis not present

## 2017-02-27 DIAGNOSIS — N138 Other obstructive and reflux uropathy: Secondary | ICD-10-CM | POA: Diagnosis not present

## 2017-02-27 DIAGNOSIS — R310 Gross hematuria: Secondary | ICD-10-CM | POA: Diagnosis not present

## 2017-03-27 DIAGNOSIS — R911 Solitary pulmonary nodule: Secondary | ICD-10-CM | POA: Diagnosis not present

## 2017-04-04 ENCOUNTER — Encounter: Payer: Self-pay | Admitting: Internal Medicine

## 2017-04-04 ENCOUNTER — Other Ambulatory Visit: Payer: PPO

## 2017-04-04 ENCOUNTER — Ambulatory Visit: Payer: PPO | Admitting: Internal Medicine

## 2017-04-04 VITALS — BP 124/68 | HR 70 | Ht 71.0 in | Wt 261.6 lb

## 2017-04-04 DIAGNOSIS — F17201 Nicotine dependence, unspecified, in remission: Secondary | ICD-10-CM | POA: Diagnosis not present

## 2017-04-04 DIAGNOSIS — R918 Other nonspecific abnormal finding of lung field: Secondary | ICD-10-CM | POA: Diagnosis not present

## 2017-04-04 DIAGNOSIS — Z87891 Personal history of nicotine dependence: Secondary | ICD-10-CM | POA: Diagnosis not present

## 2017-04-04 NOTE — Progress Notes (Signed)
04/04/17-67 year old male former smoker referred courtesy of Dr. Lawerance Bach for pulmonary evaluation because of lung nodules. ----Pulmonary Consult: Self referral Lung nodules He presents with his wife today giving history of known lung nodules dating back to 2012.  Most recent imaging report suggests there may have been some progression of a left lower lobe nodule I do not yet have comparison disc which has been requested.  He had smoked very heavily in the past but has now quit and denies routine cough.  Not aware of any active lung disease or cancer history.  Does not remember a TB test.  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 CT chest 03/27/17 [facility]) 1.Left lower lobe nodule (9 mm) with lobulated appearance and possible fat component as described above is similar in size to the 02/27/2017 comparison CT and larger than the outside 2012 comparison CT. Recommend follow up CT of the chest in 3 months for reassessment. Stable LLL 3 mm nodule. 7x3 mm LLL nodule 2.Age-indeterminate T3 compression fracture with 30% height loss, correlate with tenderness to palpation. FINDINGS:    Thoracic inlet/central airways: Thyroid normal. Airway patent.  Mediastinum/hila/axilla: No adenopathy.  Heart/vessels: Normal heart size. No pericardial effusion. Aorta normal in caliber and appearance. No central pulmonary embolism.  Lungs/pleura: Left lower lobe nodule with lobulated appearance and possible fat component measures approximately 9 mm, similar in size to the 02/27/2017 comparison CT and larger than the outside 2012 comparison CT. Second nodule in the right lower lobe has an appearance suggestive of benign atypical perifissural lymph node and measures approximately 7 x 3 mm with average diameter 5 mm (series 3/image 147). No pleural effusion or pneumothorax.  Upper abdomen: Moderate hiatal hernia. Accessory spleen.  Chest wall/MSK: Compression deformity  of the T3 superior endplate with approximately 30% height loss. Multilevel thoracic spine degenerative changes with bridging anterior osteophytes.  CT chest 02/11/12 Lungs/pleura: No pleural effusion.  There is no atelectasis or airspace consolidation.  Bilateral pulmonary nodules are again identified.  Left upper lobe index nodule measures 6 mm, image 35. Unchanged from previous exam.  5 mm left lower lobe pulmonary nodule is stable, image 44/series 3.  5 mm nodule in the right lower lobe is identified, image 36.  Stable from previous exam.  No new or enlarging nodules or masses identified. Heart/Mediastinum: There is no mediastinal or hilar adenopathy. Heart size is normal.  No pericardial effusion.  Moderate hiatal hernia is noted. Upper abdomen: No abnormality identified. Bones/Musculoskeletal:  Review of the visualized osseous structures is significant for mild multilevel thoracic spondylosis. IMPRESSION: 1.  Pulmonary nodules are unchanged from previous exam.  Nodules have been stable since 04/02/2010 and likely benign. 2.  Hiatal hernia.  Prior to Admission medications   Medication Sig Start Date End Date Taking? Authorizing Provider  ALPRAZolam Duanne Moron) 0.5 MG tablet Take 1 tablet by mouth 3 (three) times daily as needed for anxiety.  12/13/11  Yes [provider]  atorvastatin (LIPITOR) 20 MG tablet TAKE ONE TABLET BY MOUTH AT BEDTIME   Yes Rehman, Najeeb U, MD  ferrous sulfate 325 (65 FE) MG EC tablet Take 325 mg by mouth daily with breakfast.    Yes [provider]  Multiple Vitamin (MULTIVITAMIN) tablet Take 1 tablet by mouth daily.   Yes [provider]  pantoprazole (PROTONIX) 40 MG tablet TAKE ONE TABLET BY MOUTH ONCE DAILY 02/11/17  Yes Setzer, Terri L, NP  tamsulosin (FLOMAX) 0.4 MG CAPS capsule Take 0.4  mg by mouth.   Yes [provider]   Past Medical History:  Diagnosis Date  . GERD (gastroesophageal reflux disease)   . Hyperlipemia    . Kidney stone   . Sleep apnea    Past Surgical History:  Procedure Laterality Date  . BALLOON DILATION N/A 03/11/2012   Procedure: BALLOON DILATION;  Surgeon: Rogene Houston, MD;  [facility];  Service: Endoscopy;  Laterality: N/A;  . COLONOSCOPY WITH ESOPHAGOGASTRODUODENOSCOPY (EGD) N/A 03/11/2012   Procedure: COLONOSCOPY WITH ESOPHAGOGASTRODUODENOSCOPY (EGD);  Surgeon: Rogene Houston, MD;  [facility];  Service: Endoscopy;  Laterality: N/A;  1200  . ESOPHAGEAL DILATION N/A 02/15/2015   Procedure: ESOPHAGEAL DILATION;  Surgeon: Rogene Houston, MD;  [facility];  Service: Endoscopy;  Laterality: N/A;  . ESOPHAGOGASTRODUODENOSCOPY N/A 02/15/2015   Procedure: ESOPHAGOGASTRODUODENOSCOPY (EGD);  Surgeon: Rogene Houston, MD;  [facility];  Service: Endoscopy;  Laterality: N/A;  2:00  . EYE SURGERY     lasik- bilaterally  . TONSILLECTOMY     Family History  Problem Relation Age of Onset  . Dementia Mother   . Breast cancer Mother   . Coronary artery disease Mother   . Coronary artery disease Father   . Hypertension Father   . Diabetes type II Father   . Hyperlipidemia Father   . GER disease Father    Social History   Socioeconomic History  . Marital status: Married    Spouse name: Not on file  . Number of children: Not on file  . Years of education: Not on file  . Highest education level: Not on file  Occupational History  . Not on file  Social Needs  . Financial resource strain: Not on file  . Food insecurity:    Worry: Not on file    Inability: Not on file  . Transportation needs:    Medical: Not on file    Non-medical: Not on file  Tobacco Use  . Smoking status: Former Smoker    Packs/day: 1.50    Types: Cigarettes    Last attempt to quit: 08/27/2010    Years since quitting: 6.6  . Smokeless tobacco: Never Used  . Tobacco comment: quit 7 yrs ago  Substance and Sexual Activity  . Alcohol use: Yes    Alcohol/week: 0.0  oz    Comment: social rare  . Drug use: No  . Sexual activity: Yes  Lifestyle  . Physical activity:    Days per week: Not on file    Minutes per session: Not on file  . Stress: Not on file  Relationships  . Social connections:    Talks on phone: Not on file    Gets together: Not on file    Attends religious service: Not on file    Active member of club or organization: Not on file    Attends meetings of clubs or organizations: Not on file    Relationship status: Not on file  . Intimate partner violence:    Fear of current or ex partner: Not on file    Emotionally abused: Not on file    Physically abused: Not on file    Forced sexual activity: Not on file  Other Topics Concern  . Not on file  Social History Narrative  . Not on file   ROS-see HPI   + = positive Constitutional:    weight loss, night sweats, fevers, chills, fatigue, lassitude. HEENT:  headaches, +difficulty swallowing, tooth/dental problems, sore throat,       sneezing, itching, ear ache, nasal congestion, post nasal drip, snoring CV:    chest pain, orthopnea, PND, swelling in lower extremities, anasarca,                                                    dizziness, palpitations Resp:   +shortness of breath with exertion or at rest.                productive cough,   non-productive cough, coughing up of blood.              change in color of mucus.  wheezing.   Skin:    rash or lesions. GI:  No-   heartburn, indigestion, abdominal pain, nausea, vomiting, diarrhea,                 change in bowel habits, loss of appetite GU: dysuria, change in color of urine, no urgency or frequency.   flank pain. MS:   joint pain, stiffness, decreased range of motion, back pain. Neuro-     nothing unusual Psych:  change in mood or affect.  depression or anxiety.   memory loss.  OBJ- Physical Exam General- Alert, Oriented, Affect-appropriate, Distress- none acute, + overweight Skin- rash-none, lesions- none, excoriation-  none Lymphadenopathy- none Head- atraumatic            Eyes- Gross vision intact, PERRLA, conjunctivae and secretions clear            Ears- Hearing, canals-normal            Nose- Clear, no-Septal dev, mucus, polyps, erosion, perforation             Throat- Mallampati II , mucosa clear , drainage- none, tonsils- atrophic Neck- flexible , trachea midline, no stridor , thyroid nl, carotid no bruit Chest - symmetrical excursion , unlabored           Heart/CV- RRR , no murmur , no gallop  , no rub, nl s1 s2                           - JVD- none , edema- none, stasis changes- none, varices- none           Lung- clear to P&A, wheeze- none, cough- none , dullness-none, rub- none           Chest wall-  Abd-  Br/ Gen/ Rectal- Not done, not indicated Extrem- cyanosis- none, clubbing, none, atrophy- none, strength- nl Neuro- grossly intact to observation

## 2017-04-04 NOTE — Patient Instructions (Signed)
Order- CT chest high resolution future- June- at [facility]    Dx multiple lung nodules  Order- schedule PFT     Dx history of tobacco smoking  Order- lab-   Quantiferon TB assay

## 2017-04-05 DIAGNOSIS — F17201 Nicotine dependence, unspecified, in remission: Secondary | ICD-10-CM | POA: Insufficient documentation

## 2017-04-05 LAB — QUANTIFERON-TB GOLD PLUS
Mitogen-NIL: >10 IU/mL
NIL: 0.03 [IU]/mL
QuantiFERON-TB Gold Plus: NEGATIVE
TB2-NIL: <0 IU/mL

## 2017-04-05 NOTE — Assessment & Plan Note (Signed)
Appropriate to maintain long-term surveillance Plan-PFT

## 2017-04-05 NOTE — Assessment & Plan Note (Signed)
Primary concern is a 9 mm left lower lobe nodule which has grown over the last several years.  Still too small for liable PET scan evaluation.  This was discussed. Plan-PFT, TB assay, future CT in 3 months to be done at [facility] for direct comparison.

## 2017-04-22 ENCOUNTER — Institutional Professional Consult (permissible substitution): Payer: PPO | Admitting: Internal Medicine

## 2017-06-10 ENCOUNTER — Ambulatory Visit (HOSPITAL_COMMUNITY)
Admission: RE | Admit: 2017-06-10 | Discharge: 2017-06-10 | Disposition: A | Discharge status: Home / Self Care | Payer: PPO | Source: Ambulatory Visit | Attending: Internal Medicine | Admitting: Internal Medicine

## 2017-06-10 DIAGNOSIS — K449 Diaphragmatic hernia without obstruction or gangrene: Secondary | ICD-10-CM | POA: Insufficient documentation

## 2017-06-10 DIAGNOSIS — J438 Other emphysema: Secondary | ICD-10-CM | POA: Diagnosis not present

## 2017-06-10 DIAGNOSIS — I2510 Atherosclerotic heart disease of native coronary artery without angina pectoris: Secondary | ICD-10-CM | POA: Diagnosis not present

## 2017-06-10 DIAGNOSIS — K760 Fatty (change of) liver, not elsewhere classified: Secondary | ICD-10-CM | POA: Diagnosis not present

## 2017-06-10 DIAGNOSIS — R918 Other nonspecific abnormal finding of lung field: Secondary | ICD-10-CM | POA: Diagnosis not present

## 2017-06-10 DIAGNOSIS — J432 Centrilobular emphysema: Secondary | ICD-10-CM | POA: Diagnosis not present

## 2017-06-10 DIAGNOSIS — I700 Atherosclerosis of aorta: Secondary | ICD-10-CM | POA: Insufficient documentation

## 2017-06-10 DIAGNOSIS — I358 Other nonrheumatic aortic valve disorders: Secondary | ICD-10-CM | POA: Diagnosis not present

## 2017-06-10 DIAGNOSIS — R911 Solitary pulmonary nodule: Secondary | ICD-10-CM | POA: Diagnosis not present

## 2017-06-11 ENCOUNTER — Other Ambulatory Visit: Payer: Self-pay | Admitting: Internal Medicine

## 2017-06-11 DIAGNOSIS — R911 Solitary pulmonary nodule: Secondary | ICD-10-CM

## 2017-06-11 DIAGNOSIS — R918 Other nonspecific abnormal finding of lung field: Secondary | ICD-10-CM

## 2017-06-16 ENCOUNTER — Encounter (HOSPITAL_COMMUNITY)
Admission: RE | Admit: 2017-06-16 | Discharge: 2017-06-16 | Disposition: A | Discharge status: Home / Self Care | Payer: PPO | Source: Ambulatory Visit | Attending: Internal Medicine | Admitting: Internal Medicine

## 2017-06-16 DIAGNOSIS — R911 Solitary pulmonary nodule: Secondary | ICD-10-CM | POA: Diagnosis not present

## 2017-06-16 DIAGNOSIS — R918 Other nonspecific abnormal finding of lung field: Secondary | ICD-10-CM | POA: Insufficient documentation

## 2017-06-16 MED ORDER — FLUDEOXYGLUCOSE F - 18 (FDG) INJECTION
12.0500 | Freq: Once | INTRAVENOUS | Status: AC | PRN
Start: 2017-06-16 — End: 2017-06-16
  Administered 2017-06-16: 12.05 via INTRAVENOUS

## 2017-07-07 ENCOUNTER — Ambulatory Visit (INDEPENDENT_AMBULATORY_CARE_PROVIDER_SITE_OTHER): Payer: PPO | Admitting: Internal Medicine

## 2017-07-07 ENCOUNTER — Encounter: Payer: Self-pay | Admitting: Internal Medicine

## 2017-07-07 VITALS — BP 126/88 | HR 70 | Ht 71.0 in | Wt 254.0 lb

## 2017-07-07 DIAGNOSIS — I7090 Unspecified atherosclerosis: Secondary | ICD-10-CM

## 2017-07-07 DIAGNOSIS — N400 Enlarged prostate without lower urinary tract symptoms: Secondary | ICD-10-CM | POA: Diagnosis not present

## 2017-07-07 DIAGNOSIS — Z87891 Personal history of nicotine dependence: Secondary | ICD-10-CM | POA: Diagnosis not present

## 2017-07-07 DIAGNOSIS — R911 Solitary pulmonary nodule: Secondary | ICD-10-CM

## 2017-07-07 DIAGNOSIS — R918 Other nonspecific abnormal finding of lung field: Secondary | ICD-10-CM

## 2017-07-07 DIAGNOSIS — K219 Gastro-esophageal reflux disease without esophagitis: Secondary | ICD-10-CM | POA: Diagnosis not present

## 2017-07-07 DIAGNOSIS — K449 Diaphragmatic hernia without obstruction or gangrene: Secondary | ICD-10-CM

## 2017-07-07 DIAGNOSIS — N401 Enlarged prostate with lower urinary tract symptoms: Secondary | ICD-10-CM | POA: Insufficient documentation

## 2017-07-07 LAB — PULMONARY FUNCTION TEST
DL/VA % pred: 94 %
DL/VA: 4.42 ml/min/mmHg/L
DLCO UNC: 26.62 ml/min/mmHg
DLCO unc % pred: 79 %
FEF 25-75 Post: 2.66 L/sec
FEF 25-75 Pre: 2.8 L/sec
FEF2575-%Change-Post: -4 %
FEF2575-%PRED-POST: 99 %
FEF2575-%Pred-Pre: 104 %
FEV1-%CHANGE-POST: -1 %
FEV1-%PRED-POST: 86 %
FEV1-%PRED-PRE: 87 %
FEV1-POST: 3.01 L
FEV1-PRE: 3.05 L
FEV1FVC-%Change-Post: -2 %
FEV1FVC-%Pred-Pre: 106 %
FEV6-%Change-Post: 1 %
FEV6-%PRED-POST: 87 %
FEV6-%PRED-PRE: 86 %
FEV6-POST: 3.88 L
FEV6-PRE: 3.82 L
FEV6FVC-%CHANGE-POST: 0 %
FEV6FVC-%PRED-PRE: 104 %
FEV6FVC-%Pred-Post: 104 %
FVC-%Change-Post: 1 %
FVC-%PRED-POST: 83 %
FVC-%Pred-Pre: 82 %
FVC-Post: 3.91 L
FVC-Pre: 3.87 L
POST FEV1/FVC RATIO: 77 %
POST FEV6/FVC RATIO: 99 %
Pre FEV1/FVC ratio: 79 %
Pre FEV6/FVC Ratio: 99 %
RV % PRED: 72 %
RV: 1.79 L
TLC % pred: 79 %
TLC: 5.77 L

## 2017-07-07 NOTE — Progress Notes (Signed)
04/04/17-67 year old male former smoker referred courtesy of Dr. Lawerance Bach for pulmonary evaluation because of lung nodules. ----Pulmonary Consult: Self referral Lung nodules He presents with his wife today giving history of known lung nodules dating back to 2012.  Most recent imaging report suggests there may have been some progression of a left lower lobe nodule I do not yet have comparison disc which has been requested.  He had smoked very heavily in the past but has now quit and denies routine cough.  Not aware of any active lung disease or cancer history.  Does not remember a TB test.  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 CT chest 03/27/17 [facility]) 1.Left lower lobe nodule (9 mm) with lobulated appearance and possible fat component as described above is similar in size to the 02/27/2017 comparison CT and larger than the outside 2012 comparison CT. Recommend follow up CT of the chest in 3 months for reassessment. Stable LLL 3 mm nodule. 7x3 mm LLL nodule 2.Age-indeterminate T3 compression fracture with 30% height loss, correlate with tenderness to palpation. FINDINGS:    Thoracic inlet/central airways: Thyroid normal. Airway patent.  Mediastinum/hila/axilla: No adenopathy.  Heart/vessels: Normal heart size. No pericardial effusion. Aorta normal in caliber and appearance. No central pulmonary embolism.  Lungs/pleura: Left lower lobe nodule with lobulated appearance and possible fat component measures approximately 9 mm, similar in size to the 02/27/2017 comparison CT and larger than the outside 2012 comparison CT. Second nodule in the right lower lobe has an appearance suggestive of benign atypical perifissural lymph node and measures approximately 7 x 3 mm with average diameter 5 mm (series 3/image 147). No pleural effusion or pneumothorax.  Upper abdomen: Moderate hiatal hernia. Accessory spleen.  Chest wall/MSK: Compression deformity  of the T3 superior endplate with approximately 30% height loss. Multilevel thoracic spine degenerative changes with bridging anterior osteophytes. . CT chest 02/11/12 Lungs/pleura: No pleural effusion.  There is no atelectasis or airspace consolidation.  Bilateral pulmonary nodules are again identified.  Left upper lobe index nodule measures 6 mm, image 35. Unchanged from previous exam.  5 mm left lower lobe pulmonary nodule is stable, image 44/series 3.  5 mm nodule in the right lower lobe is identified, image 36.  Stable from previous exam.  No new or enlarging nodules or masses identified. Heart/Mediastinum: There is no mediastinal or hilar adenopathy. Heart size is normal.  No pericardial effusion.  Moderate hiatal hernia is noted. Upper abdomen: No abnormality identified. Bones/Musculoskeletal:  Review of the visualized osseous structures is significant for mild multilevel thoracic spondylosis. IMPRESSION: 1.  Pulmonary nodules are unchanged from previous exam.  Nodules have been stable since 04/02/2010 and likely benign. 2.  Hiatal hernia.  07/07/2017-67 year old male former heavy smoker followed for bilateral lung nodules with particular interest in left lower lobe nodule. Lung Nodules: Review PFT with patient.  Some recent stress because wife had a mild stroke, but his own health is been stable We reviewed his PFT showing only minimal obstructive disease despite his remote heavy smoking. We reviewed his CT and PET scan with minimal change or activity, intending follow-up CT in 6 months.  He was comfortable with this approach.  We discussed his hiatal hernia and other incidental findings from his imaging studies, which he can review with his PCP. We discussed atherosclerosis and aortic valve calcifications seen on imaging and he is interested in cardiology referral for evaluation. PFT-07/07/17-minimal obstruction, slight reduction of diffusion CT chest HiRes 06/10/17- IMPRESSION:  1.  Progressive enlargement of left lower lobe nodule which currently measures 10 x 9 mm. This is concerning for primary bronchogenic neoplasm. Further evaluation with PET-CT is recommended. 2. No findings of interstitial lung disease. However, there is mild diffuse bronchial wall thickening with mild centrilobular and paraseptal emphysema; imaging findings suggestive of underlying COPD. 3. Moderate to large hiatal hernia. 4. Aortic atherosclerosis, in addition to left main and 3 vessel coronary artery disease. Please note that although the presence of coronary artery calcium documents the presence of coronary artery disease, the severity of this disease and any potential stenosis cannot be assessed on this non-gated CT examination. Assessment for potential risk factor modification, dietary therapy or pharmacologic therapy may be warranted, if clinically indicated. 5. There are calcifications of the aortic valve. Echocardiographic correlation for evaluation of potential valvular dysfunction may be warranted if clinically indicated. 6. Mild hepatic steatosis. PET scan 06/16/2017- IMPRESSION: 1. Left lower lobe 1.0 cm solid pulmonary nodule is non hypermetabolic, more suggestive of a benign etiology. Recommend continued chest CT surveillance in 6-12 months, as a low-grade neoplasm cannot be excluded by PET. 2. No hypermetabolic disease. 3. Chronic findings include: Aortic Atherosclerosis (ICD10-I70.0). Coronary atherosclerosis. Nonobstructing bilateral nephrolithiasis. Moderate hiatal hernia. Moderate sigmoid diverticulosis. Mild diffuse bladder wall thickening, nonspecific, potentially due to chronic bladder outlet obstruction by the markedly enlarged prostate.  ROS-see HPI   + = positive Constitutional:    weight loss, night sweats, fevers, chills, fatigue, lassitude. HEENT:    headaches, +difficulty swallowing, tooth/dental problems, sore throat,       sneezing, itching, ear ache,  nasal congestion, post nasal drip, snoring CV:    chest pain, orthopnea, PND, swelling in lower extremities, anasarca,                                             dizziness, palpitations Resp:   +shortness of breath with exertion or at rest.                productive cough,   non-productive cough, coughing up of blood.              change in color of mucus.  wheezing.   Skin:    rash or lesions. GI:  No-   heartburn, indigestion, abdominal pain, nausea, vomiting, diarrhea,                 change in bowel habits, loss of appetite GU: dysuria, change in color of urine, no urgency or frequency.   flank pain. MS:   joint pain, stiffness, decreased range of motion, back pain. Neuro-     nothing unusual Psych:  change in mood or affect.  depression or anxiety.   memory loss.  OBJ- Physical Exam General- Alert, Oriented, Affect-appropriate, Distress- none acute, + overweight Skin- rash-none, lesions- none, excoriation- none Lymphadenopathy- none Head- atraumatic            Eyes- Gross vision intact, PERRLA, conjunctivae and secretions clear            Ears- Hearing, canals-normal            Nose- Clear, no-Septal dev, mucus, polyps, erosion, perforation             Throat- Mallampati II , mucosa clear , drainage- none, tonsils- atrophic Neck- flexible , trachea midline, no stridor , thyroid nl, carotid no bruit  Chest - symmetrical excursion , unlabored           Heart/CV- RRR , no murmur , no gallop  , no rub, nl s1 s2                           - JVD- none , edema- none, stasis changes- none, varices- none           Lung- clear to P&A, wheeze- none, cough- none , dullness-none, rub- none           Chest wall-  Abd-  Br/ Gen/ Rectal- Not done, not indicated Extrem- cyanosis- none, clubbing, none, atrophy- none, strength- nl Neuro- grossly intact to observation

## 2017-07-07 NOTE — Assessment & Plan Note (Signed)
He is advised to discuss this with his urologist.

## 2017-07-07 NOTE — Assessment & Plan Note (Signed)
Discussed reflux precautions

## 2017-07-07 NOTE — Assessment & Plan Note (Signed)
Chronicity and low uptake on PET are very hopeful that this will be at most an indolent tumor. Plan-repeat chest CT in 6 months

## 2017-07-07 NOTE — Progress Notes (Signed)
PFT done today. 

## 2017-07-07 NOTE — Patient Instructions (Addendum)
Order- schedule future CT chest HiRes, non contrast, 6 months   Dx  Bilateral lung nodules  Order- referral to cardiology dx atherosclerosis for risk stratification  Please call if we can help

## 2017-07-23 ENCOUNTER — Encounter: Payer: Self-pay | Admitting: *Deleted

## 2017-07-24 ENCOUNTER — Ambulatory Visit: Payer: PPO | Admitting: Cardiovascular Disease

## 2017-07-24 ENCOUNTER — Encounter: Payer: Self-pay | Admitting: Cardiovascular Disease

## 2017-07-24 ENCOUNTER — Encounter: Payer: Self-pay | Admitting: *Deleted

## 2017-07-24 VITALS — BP 134/84 | HR 68 | Ht 71.0 in | Wt 255.0 lb

## 2017-07-24 DIAGNOSIS — E7800 Pure hypercholesterolemia, unspecified: Secondary | ICD-10-CM | POA: Diagnosis not present

## 2017-07-24 DIAGNOSIS — Z8249 Family history of ischemic heart disease and other diseases of the circulatory system: Secondary | ICD-10-CM | POA: Diagnosis not present

## 2017-07-24 DIAGNOSIS — Z01812 Encounter for preprocedural laboratory examination: Secondary | ICD-10-CM

## 2017-07-24 DIAGNOSIS — I2510 Atherosclerotic heart disease of native coronary artery without angina pectoris: Secondary | ICD-10-CM | POA: Diagnosis not present

## 2017-07-24 DIAGNOSIS — I359 Nonrheumatic aortic valve disorder, unspecified: Secondary | ICD-10-CM | POA: Diagnosis not present

## 2017-07-24 DIAGNOSIS — Z87891 Personal history of nicotine dependence: Secondary | ICD-10-CM

## 2017-07-24 MED ORDER — METOPROLOL TARTRATE 25 MG PO TABS: ORAL_TABLET | ORAL | 0 refills | Status: DC

## 2017-07-24 NOTE — Patient Instructions (Addendum)
Medication Instructions:  Continue all current medications.  Labwork: BMET - order given today   Testing/Procedures:  Coronary CT angiogram  Your physician has requested that you have an echocardiogram. Echocardiography is a painless test that uses sound waves to create images of your heart. It provides your doctor with information about the size and shape of your heart and how well your heart's chambers and valves are working. This procedure takes approximately one hour. There are no restrictions for this procedure.  Office will contact with results via phone or letter.    Follow-Up: 10-12 weeks   Any Other Special Instructions Will Be Listed Below (If Applicable).  If you need a refill on your cardiac medications before your next appointment, please call your pharmacy.

## 2017-07-24 NOTE — Progress Notes (Signed)
CARDIOLOGY CONSULT NOTE  Patient ID: James Norris MRN: [id number] DOB/AGE: Dec 26, 1950 67 y.o.  Admit date: (Not on file) Primary Physician: Redmond School, MD Referring Physician: Dr. Baird Lyons  Reason for Consultation: Atherosclerosis and aortic valve calcifications  HPI: James Norris is a 67 y.o. male who is being seen today for the evaluation of atherosclerosis and aortic valve calcifications at the request of Dr. Baird Lyons.  He recently saw pulmonary on 07/07/2017.  He has a history of lung nodules.  He underwent a high-resolution chest CT on 06/10/2017 which demonstrated aortic atherosclerosis as well as atherosclerosis of the great vessels of the mediastinum and the coronary arteries, including calcified atherosclerotic plaque in the left main, left anterior descending, left circumflex and right coronary arteries. There were also calcifications of the aortic valve.  I personally reviewed the ECG performed today which demonstrates sinus rhythm with incomplete right bundle branch block, anterior fascicular block, and late R wave transition.  The patient denies any symptoms of chest pain, palpitations, shortness of breath, lightheadedness, dizziness, leg swelling, orthopnea, PND, and syncope.  He stays quite active and is able to ski at 10 to 12,000 feet without any shortness of breath.  He is remodeling a house located between eating in [place] and does most of the work himself without any exertional symptoms.  Social history: He is a retired Chief Executive Officer and studied at [employer] for [employer] and [employer] for law school.  His father was a cardiologist who practiced in [place].  He died at the age of 70.  His brother is a retired Administrator, Civil Service and used to work at [employer].  He has a son who is starting medical school at [employer].  He has 2 sons who are state champion wrestlers.  He has a house in [place], [place], and travel to [place]  frequently.  He is married.  His wife had a stroke in May 2019.  She underwent PFO closure by Dr. Gerrit Halls. He is to smoke 3 packs of cigarettes daily since age of 75 and quit in 2008.  Family history: His father underwent coronary artery bypass graft surgery at the age of 53 and had 3 pacemakers and underwent a redo bypass surgery.  He has a twin brother who had coronary artery stenting at the age of 23.        Current Outpatient Medications  Medication Sig Dispense Refill  . ALPRAZolam (XANAX) 0.5 MG tablet Take 1 tablet by mouth 3 (three) times daily as needed for anxiety.     Marland Kitchen atorvastatin (LIPITOR) 20 MG tablet TAKE ONE TABLET BY MOUTH AT BEDTIME 90 tablet 3  . ferrous sulfate 325 (65 FE) MG EC tablet Take 325 mg by mouth daily with breakfast.     . Multiple Vitamin (MULTIVITAMIN) tablet Take 1 tablet by mouth daily.    . pantoprazole (PROTONIX) 40 MG tablet TAKE ONE TABLET BY MOUTH ONCE DAILY 30 tablet 11  . tamsulosin (FLOMAX) 0.4 MG CAPS capsule Take 0.4 mg by mouth.     No current facility-administered medications for this visit.     Past Medical History:  Diagnosis Date  . GERD (gastroesophageal reflux disease)   . Hyperlipemia   . Kidney stone   . Sleep apnea     Past Surgical History:  Procedure Laterality Date  . BALLOON DILATION N/A 03/11/2012   Procedure: BALLOON DILATION;  Surgeon: Rogene Houston, MD;  [facility];  Service: Endoscopy;  Laterality: N/A;  . COLONOSCOPY WITH ESOPHAGOGASTRODUODENOSCOPY (EGD) N/A 03/11/2012   Procedure: COLONOSCOPY WITH ESOPHAGOGASTRODUODENOSCOPY (EGD);  Surgeon: Rogene Houston, MD;  Location: [facility];  Service: Endoscopy;  Laterality: N/A;  1200  . ESOPHAGEAL DILATION N/A 02/15/2015   Procedure: ESOPHAGEAL DILATION;  Surgeon: Rogene Houston, MD;  [facility]: [facility];  Service: Endoscopy;  Laterality: N/A;  . ESOPHAGOGASTRODUODENOSCOPY N/A 02/15/2015   Procedure: ESOPHAGOGASTRODUODENOSCOPY (EGD);  Surgeon:  Rogene Houston, MD;  Location: [facility];  Service: Endoscopy;  Laterality: N/A;  2:00  . EYE SURGERY     lasik- bilaterally  . TONSILLECTOMY      Social History   Socioeconomic History  . Marital status: Married    Spouse name: Not on file  . Number of children: Not on file  . Years of education: Not on file  . Highest education level: Not on file  Occupational History  . Not on file  Social Needs  . Financial resource strain: Not on file  . Food insecurity:    Worry: Not on file    Inability: Not on file  . Transportation needs:    Medical: Not on file    Non-medical: Not on file  Tobacco Use  . Smoking status: Former Smoker    Packs/day: 1.50    Types: Cigarettes    Last attempt to quit: 08/27/2010    Years since quitting: 6.9  . Smokeless tobacco: Never Used  . Tobacco comment: quit 7 yrs ago  Substance and Sexual Activity  . Alcohol use: Yes    Alcohol/week: 0.0 oz    Comment: social rare  . Drug use: No  . Sexual activity: Yes  Lifestyle  . Physical activity:    Days per week: Not on file    Minutes per session: Not on file  . Stress: Not on file  Relationships  . Social connections:    Talks on phone: Not on file    Gets together: Not on file    Attends religious service: Not on file    Active member of club or organization: Not on file    Attends meetings of clubs or organizations: Not on file    Relationship status: Not on file  . Intimate partner violence:    Fear of current or ex partner: Not on file    Emotionally abused: Not on file    Physically abused: Not on file    Forced sexual activity: Not on file  Other Topics Concern  . Not on file  Social History Narrative  . Not on file      Current Meds  Medication Sig  . ALPRAZolam (XANAX) 0.5 MG tablet Take 1 tablet by mouth 3 (three) times daily as needed for anxiety.   Marland Kitchen atorvastatin (LIPITOR) 20 MG tablet TAKE ONE TABLET BY MOUTH AT BEDTIME  . ferrous sulfate 325 (65 FE) MG EC  tablet Take 325 mg by mouth daily with breakfast.   . Multiple Vitamin (MULTIVITAMIN) tablet Take 1 tablet by mouth daily.  . pantoprazole (PROTONIX) 40 MG tablet TAKE ONE TABLET BY MOUTH ONCE DAILY  . tamsulosin (FLOMAX) 0.4 MG CAPS capsule Take 0.4 mg by mouth.      Review of systems complete and found to be negative unless listed above in HPI    Physical exam Blood pressure 134/84, pulse 68, height 5\' 11"  (1.803 m), weight 255 lb (115.7 kg), SpO2 97 %. General: NAD Neck: No JVD, no thyromegaly  or thyroid nodule.  Lungs: Clear to auscultation bilaterally with normal respiratory effort. CV: Nondisplaced PMI. Regular rate and rhythm, normal S1/S2, no S3/S4, no murmur.  No peripheral edema.  No carotid bruit.  Abdomen: Soft, nontender, no distention.  Skin: Intact without lesions or rashes.  Neurologic: Alert and oriented x 3.  Psych: Normal affect. Extremities: No clubbing or cyanosis.  HEENT: Normal.   ECG: Most recent ECG reviewed.   Labs: Lab Results  Component Value Date/Time   K 4.3 03/27/2010 05:00 PM   BUN 17 03/27/2010 05:00 PM   CREATININE 1.78 (H) 03/27/2010 05:00 PM   ALT 21 03/27/2010 05:00 PM   HGB 14.4 03/27/2010 05:00 PM     Lipids: No results found for: LDLCALC, LDLDIRECT, CHOL, TRIG, HDL      ASSESSMENT AND PLAN:  1.  Coronary artery and aortic valve calcifications: He denies exertional symptoms.  He has a family history of premature coronary artery disease.  He has hyperlipidemia and a prior history of tobacco use.  Coronary artery calcifications are of unclear significance at this time. I will obtain coronary CT angiography for further clarification in order to evaluate for hemodynamically significant coronary artery disease.   I will order a 2-D echocardiogram with Doppler to evaluate cardiac structure, function, and regional wall motion and to assess the aortic valve for stenosis.  2.  Hyperlipidemia: Currently on Lipitor 20 mg  daily.      Disposition: Follow up in 8-10 weeks  Signed: Kate Sable, M.D., F.A.C.C.  07/24/2017, 11:06 AM

## 2017-07-30 ENCOUNTER — Ambulatory Visit (INDEPENDENT_AMBULATORY_CARE_PROVIDER_SITE_OTHER): Payer: PPO

## 2017-07-30 ENCOUNTER — Other Ambulatory Visit: Payer: Self-pay

## 2017-07-30 DIAGNOSIS — I2510 Atherosclerotic heart disease of native coronary artery without angina pectoris: Secondary | ICD-10-CM

## 2017-08-04 ENCOUNTER — Telehealth: Payer: Self-pay | Admitting: *Deleted

## 2017-08-04 NOTE — Telephone Encounter (Signed)
-----   Message from Arnoldo Lenis, MD sent at 08/01/2017  3:15 PM EDT ----- Echo looks good, normal heart function   Zandra Abts MD

## 2017-08-04 NOTE — Telephone Encounter (Signed)
Patient informed. 

## 2017-08-15 ENCOUNTER — Ambulatory Visit (HOSPITAL_COMMUNITY): Payer: PPO

## 2017-09-16 ENCOUNTER — Other Ambulatory Visit: Payer: Self-pay | Admitting: Cardiovascular Disease

## 2017-09-24 ENCOUNTER — Ambulatory Visit: Payer: PPO | Admitting: Cardiovascular Disease

## 2017-09-25 ENCOUNTER — Ambulatory Visit: Payer: PPO | Admitting: Cardiovascular Disease

## 2017-10-09 ENCOUNTER — Ambulatory Visit: Payer: PPO | Admitting: Cardiovascular Disease

## 2017-10-10 ENCOUNTER — Telehealth: Payer: Self-pay | Admitting: Cardiovascular Disease

## 2017-10-10 DIAGNOSIS — I2510 Atherosclerotic heart disease of native coronary artery without angina pectoris: Secondary | ICD-10-CM | POA: Diagnosis not present

## 2017-10-10 DIAGNOSIS — Z01812 Encounter for preprocedural laboratory examination: Secondary | ICD-10-CM | POA: Diagnosis not present

## 2017-10-10 LAB — BASIC METABOLIC PANEL
BUN: 15 mg/dL (ref 7–25)
CO2: 26 mmol/L (ref 20–32)
CREATININE: 0.98 mg/dL (ref 0.70–1.25)
Calcium: 9.2 mg/dL (ref 8.6–10.3)
Chloride: 104 mmol/L (ref 98–110)
GLUCOSE: 83 mg/dL (ref 65–139)
Potassium: 4.4 mmol/L (ref 3.5–5.3)
SODIUM: 138 mmol/L (ref 135–146)

## 2017-10-10 NOTE — Telephone Encounter (Signed)
Left message to return call 

## 2017-10-10 NOTE — Telephone Encounter (Signed)
Patient had appointment on 10-09-17 with Dr. Bronson Ing . He was in [place] . He is scheduled for CT Coronary on 10-17-2017. He had blood work done today at [facility]. Does he need to do anything before testing next Friday?

## 2017-10-10 NOTE — Telephone Encounter (Signed)
Patient informed to continue the same for now.  Will clarify with Dr. Bronson Ing as to when he wants to see patient back for follow up.

## 2017-10-10 NOTE — Telephone Encounter (Signed)
He can see APP after cardiac testing 1 month afterwards.

## 2017-10-13 NOTE — Telephone Encounter (Signed)
1 mo f/u scheduled with Bernerd Pho, PA in [place] office on 11/19/2017 at 1:30.  Patient made aware & verbalized understanding.

## 2017-10-17 ENCOUNTER — Ambulatory Visit (HOSPITAL_COMMUNITY): Admission: RE | Admit: 2017-10-17 | Payer: PPO | Source: Ambulatory Visit

## 2017-10-17 ENCOUNTER — Ambulatory Visit (HOSPITAL_COMMUNITY)
Admission: RE | Admit: 2017-10-17 | Discharge: 2017-10-17 | Disposition: A | Discharge status: Home / Self Care | Payer: PPO | Source: Ambulatory Visit | Attending: Cardiovascular Disease | Admitting: Cardiovascular Disease

## 2017-10-17 DIAGNOSIS — I2510 Atherosclerotic heart disease of native coronary artery without angina pectoris: Secondary | ICD-10-CM | POA: Insufficient documentation

## 2017-10-17 DIAGNOSIS — R918 Other nonspecific abnormal finding of lung field: Secondary | ICD-10-CM | POA: Insufficient documentation

## 2017-10-17 DIAGNOSIS — K449 Diaphragmatic hernia without obstruction or gangrene: Secondary | ICD-10-CM | POA: Diagnosis not present

## 2017-10-17 MED ORDER — NITROGLYCERIN 0.4 MG SL SUBL
SUBLINGUAL_TABLET | SUBLINGUAL | Status: AC
  Filled 2017-10-17: qty 1

## 2017-10-17 MED ORDER — IOPAMIDOL (ISOVUE-370) INJECTION 76%
100.0000 mL | Freq: Once | INTRAVENOUS | Status: AC | PRN
  Administered 2017-10-17: 80 mL via INTRAVENOUS

## 2017-10-17 MED ORDER — NITROGLYCERIN 0.4 MG SL SUBL
0.8000 mg | SUBLINGUAL_TABLET | Freq: Once | SUBLINGUAL | Status: AC
  Administered 2017-10-17: 0.8 mg via SUBLINGUAL
  Filled 2017-10-17: qty 25

## 2017-10-17 NOTE — Progress Notes (Signed)
Patient ambulatory out of department with steady gait. Patient denies any complaints.

## 2017-10-17 NOTE — Progress Notes (Signed)
CT complete. Patient denies any complaints. Offered patient snack and beverage, patient states he wanted to leave and go eat.

## 2017-10-21 ENCOUNTER — Telehealth: Payer: Self-pay | Admitting: *Deleted

## 2017-10-21 MED ORDER — ATORVASTATIN CALCIUM 40 MG PO TABS: 40.0000 mg | ORAL_TABLET | Freq: Every day | ORAL | 3 refills | Status: DC

## 2017-10-21 NOTE — Telephone Encounter (Signed)
Notes recorded by Laurine Blazer, LPN on 59/92/3414 at 3:54 PM EDT Patient notified. Copy to pmd. Follow up scheduled for 11/19/2017 in [place] office.   ------  Notes recorded by Herminio Commons, MD on 10/20/2017 at 8:41 AM EDT This study demonstrates: Very mild blockages and coronary arteries. Stable pulmonary nodules. Medication changes / Follow up studies / Other recommendations:  Increase atorvastatin to 40 mg daily to lower cardiac risk. Repeat chest CT as per PCPs recommendations regarding pulmonary nodules. Please send results to the PCP: Redmond School, MD  Kate Sable, MD 10/20/2017 8:40 AM

## 2017-11-19 ENCOUNTER — Encounter: Payer: Self-pay | Admitting: Student

## 2017-11-19 ENCOUNTER — Encounter: Payer: Self-pay | Admitting: Internal Medicine

## 2017-11-19 ENCOUNTER — Ambulatory Visit: Payer: PPO | Admitting: Student

## 2017-11-19 VITALS — BP 106/68 | HR 79 | Ht 71.0 in | Wt 260.0 lb

## 2017-11-19 DIAGNOSIS — K219 Gastro-esophageal reflux disease without esophagitis: Secondary | ICD-10-CM | POA: Diagnosis not present

## 2017-11-19 DIAGNOSIS — N401 Enlarged prostate with lower urinary tract symptoms: Secondary | ICD-10-CM | POA: Diagnosis not present

## 2017-11-19 DIAGNOSIS — R918 Other nonspecific abnormal finding of lung field: Secondary | ICD-10-CM

## 2017-11-19 DIAGNOSIS — E785 Hyperlipidemia, unspecified: Secondary | ICD-10-CM

## 2017-11-19 DIAGNOSIS — K5730 Diverticulosis of large intestine without perforation or abscess without bleeding: Secondary | ICD-10-CM | POA: Diagnosis not present

## 2017-11-19 DIAGNOSIS — Z6835 Body mass index (BMI) 35.0-35.9, adult: Secondary | ICD-10-CM | POA: Diagnosis not present

## 2017-11-19 DIAGNOSIS — I2510 Atherosclerotic heart disease of native coronary artery without angina pectoris: Secondary | ICD-10-CM

## 2017-11-19 DIAGNOSIS — Z0000 Encounter for general adult medical examination without abnormal findings: Secondary | ICD-10-CM | POA: Diagnosis not present

## 2017-11-19 DIAGNOSIS — R946 Abnormal results of thyroid function studies: Secondary | ICD-10-CM | POA: Diagnosis not present

## 2017-11-19 DIAGNOSIS — N529 Male erectile dysfunction, unspecified: Secondary | ICD-10-CM | POA: Diagnosis not present

## 2017-11-19 DIAGNOSIS — Z23 Encounter for immunization: Secondary | ICD-10-CM | POA: Diagnosis not present

## 2017-11-19 DIAGNOSIS — Z125 Encounter for screening for malignant neoplasm of prostate: Secondary | ICD-10-CM | POA: Diagnosis not present

## 2017-11-19 DIAGNOSIS — J329 Chronic sinusitis, unspecified: Secondary | ICD-10-CM | POA: Diagnosis not present

## 2017-11-19 DIAGNOSIS — Z0001 Encounter for general adult medical examination with abnormal findings: Secondary | ICD-10-CM | POA: Diagnosis not present

## 2017-11-19 DIAGNOSIS — Z79899 Other long term (current) drug therapy: Secondary | ICD-10-CM | POA: Diagnosis not present

## 2017-11-19 DIAGNOSIS — E782 Mixed hyperlipidemia: Secondary | ICD-10-CM | POA: Diagnosis not present

## 2017-11-19 DIAGNOSIS — Z1389 Encounter for screening for other disorder: Secondary | ICD-10-CM | POA: Diagnosis not present

## 2017-11-19 NOTE — Progress Notes (Signed)
Cardiology Office Note    Date:  11/19/2017   ID:  James Norris, DOB 1950/06/18, MRN [id number]  PCP:  Redmond School, MD  Cardiologist: Kate Sable, MD    Chief Complaint  Patient presents with  . Follow-up    recent Coronary CT    History of Present Illness:    James Norris is a 67 y.o. male with past medical history of HLD and pulmonary lung nodules who presents to the office today for follow-up of his recent Coronary CT.  He was examined by Dr. Bronson Ing in 07/2017 as a new patient referral after having been found to have aortic atherosclerosis and coronary calcifications by recent CT imaging. He denied any recent anginal symptoms at the time of his visit. Reported being very active at baseline. An echocardiogram and Coronary CT were recommended for further evaluation. Echocardiogram showed a preserved EF of 60 to 65% with no regional wall motion abnormalities and trivial MR. Coronary CT showed mild nonobstructive plaque in all coronary distributions with risk factor modification recommended. Was noted to have pulmonary nodules on the over read and continued follow-up with his PCP was recommended for this.  In talking with the patient today, he reports overall doing well from a cardiac perspective since his last office visit. He was recently skiing in [place] this past weekend and he denies any recent chest pain or dyspnea on exertion. Denies any recent orthopnea, PND, lower extremity edema, or palpitations.  He is a former smoker but quit approximately 4 years ago. He does report having a steady weight gain since then and is planning to increase his exercise and go to the [facility] on a more regular basis.   Past Medical History:  Diagnosis Date  . GERD (gastroesophageal reflux disease)   . Hyperlipemia   . Kidney stone   . Sleep apnea     Past Surgical History:  Procedure Laterality Date  . BALLOON DILATION N/A 03/11/2012   Procedure: BALLOON DILATION;  Surgeon:  Rogene Houston, MD;  Location[facility];  Service: Endoscopy;  Laterality: N/A;  . COLONOSCOPY WITH ESOPHAGOGASTRODUODENOSCOPY (EGD) N/A 03/11/2012   Procedure: COLONOSCOPY WITH ESOPHAGOGASTRODUODENOSCOPY (EGD);  Surgeon: Rogene Houston, MD;  Location: [facility];  Service: Endoscopy;  Laterality: N/A;  1200  . ESOPHAGEAL DILATION N/A 02/15/2015   Procedure: ESOPHAGEAL DILATION;  Surgeon: Rogene Houston, MD;  Location[facility];  Service: Endoscopy;  Laterality: N/A;  . ESOPHAGOGASTRODUODENOSCOPY N/A 02/15/2015   Procedure: ESOPHAGOGASTRODUODENOSCOPY (EGD);  Surgeon: Rogene Houston, MD;  Location: [facility];  Service: Endoscopy;  Laterality: N/A;  2:00  . EYE SURGERY     lasik- bilaterally  . TONSILLECTOMY      Current Medications: Outpatient Medications Prior to Visit  Medication Sig Dispense Refill  . ALPRAZolam (XANAX) 0.5 MG tablet Take 1 tablet by mouth 3 (three) times daily as needed for anxiety.     Marland Kitchen atorvastatin (LIPITOR) 40 MG tablet Take 1 tablet (40 mg total) by mouth at bedtime. 90 tablet 3  . ferrous sulfate 325 (65 FE) MG EC tablet Take 325 mg by mouth daily with breakfast.     . Multiple Vitamin (MULTIVITAMIN) tablet Take 1 tablet by mouth daily.    . pantoprazole (PROTONIX) 40 MG tablet TAKE ONE TABLET BY MOUTH ONCE DAILY 30 tablet 11  . metoprolol tartrate (LOPRESSOR) 25 MG tablet Take one tab by mouth one hour prior to Coronary CT 1 tablet 0  . tamsulosin (  FLOMAX) 0.4 MG CAPS capsule Take 0.4 mg by mouth.     No facility-administered medications prior to visit.      Allergies:   Penicillins   Social History   Socioeconomic History  . Marital status: Married    Spouse name: Not on file  . Number of children: Not on file  . Years of education: Not on file  . Highest education level: Not on file  Occupational History  . Not on file  Social Needs  . Financial resource strain: Not on file  . Food insecurity:    Worry: Not on file    Inability:  Not on file  . Transportation needs:    Medical: Not on file    Non-medical: Not on file  Tobacco Use  . Smoking status: Former Smoker    Packs/day: 1.50    Types: Cigarettes    Last attempt to quit: 08/27/2010    Years since quitting: 7.2  . Smokeless tobacco: Never Used  . Tobacco comment: quit 7 yrs ago  Substance and Sexual Activity  . Alcohol use: Yes    Alcohol/week: 0.0 standard drinks    Comment: social rare  . Drug use: No  . Sexual activity: Yes  Lifestyle  . Physical activity:    Days per week: Not on file    Minutes per session: Not on file  . Stress: Not on file  Relationships  . Social connections:    Talks on phone: Not on file    Gets together: Not on file    Attends religious service: Not on file    Active member of club or organization: Not on file    Attends meetings of clubs or organizations: Not on file    Relationship status: Not on file  Other Topics Concern  . Not on file  Social History Narrative  . Not on file     Family History:  The patient's family history includes Breast cancer in his mother; Coronary artery disease in his father and mother; Dementia in his mother; Diabetes type II in his father; GER disease in his father; Hyperlipidemia in his father; Hypertension in his father.   Review of Systems:   Please see the history of present illness.     General:  No chills, fever, night sweats or weight changes.  Cardiovascular:  No chest pain, dyspnea on exertion, edema, orthopnea, palpitations, paroxysmal nocturnal dyspnea. Dermatological: No rash, lesions/masses Respiratory: No cough, dyspnea Urologic: No hematuria, dysuria Abdominal:   No nausea, vomiting, diarrhea, bright red blood per rectum, melena, or hematemesis Neurologic:  No visual changes, wkns, changes in mental status.  He denies any of the above symptoms.   All other systems reviewed and are otherwise negative except as noted above.   Physical Exam:    VS:  BP 106/68 (BP  Location: Left Arm)   Pulse 79   Ht 5\' 11"  (1.803 m)   Wt 260 lb (117.9 kg)   SpO2 98%   BMI 36.26 kg/m    General: Well developed, well nourished Caucasian male appearing in no acute distress. Head: Normocephalic, atraumatic, sclera non-icteric, no xanthomas, nares are without discharge.  Neck: No carotid bruits. JVD not elevated.  Lungs: Respirations regular and unlabored, without wheezes or rales.  Heart: Regular rate and rhythm. No S3 or S4.  No murmur, no rubs, or gallops appreciated. Abdomen: Soft, non-tender, non-distended with normoactive bowel sounds. No hepatomegaly. No rebound/guarding. No obvious abdominal masses. Msk:  Strength and tone appear  normal for age. No joint deformities or effusions. Extremities: No clubbing or cyanosis. No lower extremity edema.  Distal pedal pulses are 2+ bilaterally. Neuro: Alert and oriented X 3. Moves all extremities spontaneously. No focal deficits noted. Psych:  Responds to questions appropriately with a normal affect. Skin: No rashes or lesions noted  Wt Readings from Last 3 Encounters:  11/19/17 260 lb (117.9 kg)  07/24/17 255 lb (115.7 kg)  07/07/17 254 lb (115.2 kg)     Studies/Labs Reviewed:   EKG:  EKG is not ordered today.   Recent Labs: 10/10/2017: BUN 15; Creat 0.98; Potassium 4.4; Sodium 138   Lipid Panel No results found for: CHOL, TRIG, HDL, CHOLHDL, VLDL, LDLCALC, LDLDIRECT  Additional studies/ records that were reviewed today include:   Echocardiogram: 07/2017 Study Conclusions  - Left ventricle: The cavity size was normal. Wall thickness was   increased in a pattern of mild LVH. Systolic function was normal.   The estimated ejection fraction was in the range of 60% to 65%.   Wall motion was normal; there were no regional wall motion   abnormalities. Left ventricular diastolic function parameters   were normal for the patient&'s age. - Aortic valve: Mildly calcified annulus. Probably trileaflet;   mildly  calcified leaflets. - Mitral valve: Mildly calcified annulus. There was trivial   regurgitation. - Left atrium: The atrium was at the upper limits of normal in   size. - Right atrium: Central venous pressure (est): 3 mm Hg. - Atrial septum: No defect or patent foramen ovale was identified. - Tricuspid valve: There was trivial regurgitation. - Pulmonary arteries: PA peak pressure: 17 mm Hg (S). - Pericardium, extracardiac: There was no pericardial effusion.  Coronary CT: 10/17/2017 Aorta:  Normal size.  No calcifications.  No dissection.  Aortic Valve:  Trileaflet.  Mild calcification of the aortic valve.  Coronary Arteries:  Normal coronary origin.  Right dominance.  RCA is a small, co-dominant artery. There is mild (1-24%) mixed calcified and noncalcified plaque proximally.  Left main is a large artery that gives rise to LAD and LCX arteries. There is mild (1-24%) calcified plaque.  LAD is a large vessel that gives rise to a small D1 and normal D2. It has mild (1-24%) calcified plaque in the mid LAD just distal to D2. D2 has mild (1-24%) calcified plaque proximally.  LCX is a co-dominant artery that gives rise a large OM1 and small OM2 branches. There is mild (1-24%) diffuse mixed plaque in the LCX and OM1.  Other findings:  Normal pulmonary vein drainage into the left atrium.  Normal let atrial appendage without a thrombus.  Normal size of the pulmonary artery.  IMPRESSION: 1. Coronary calcium score of 221. This was 84 percentile for age and sex matched control.  2. Normal coronary origin with right dominance.  3.  Mild, non-obstructive plaque in all coronary distributions.  4. Recommend aggressive risk factor modification and high-potency statin.    Assessment:    1. Coronary artery calcification seen on CT scan   2. Hyperlipidemia LDL goal <70   3. Multiple lung nodules      Plan:   In order of problems listed above:  1. Coronary  Artery Calcifications on CT Imaging - noted on prior CT Imaging. He denies any recent chest pain or dyspnea on exertion and is active at baseline. Recent echocardiogram showed a preserved EF of 60 to 65% with no regional wall motion abnormalities and trivial MR. Coronary CT showed mild nonobstructive  plaque in all coronary distributions with risk factor modification recommended as outlined above.  - Studies were reviewed in detail with the patient today. Will continue with risk factor modification including recent titration of Atorvastatin. We reviewed that he could start ASA 81 mg daily but he wishes to avoid being on aspirin at this time as he experienced very easy bruising with this in the past.  2. HLD - Followed by his PCP. Atorvastatin was recently titrated to 40 mg daily. He will need repeat FLP and LFT's in 6 to 8 weeks.  Goal LDL is less than 70  in the setting of known coronary calcifications.  3. Pulmonary Nodules - Followed by Pulmonology. Recent PET Scan in 06/2017 showed that his left lower lobe nodule was stable and more suggestive of a benign etiology.   Medication Adjustments/Labs and Tests Ordered: Current medicines are reviewed at length with the patient today.  Concerns regarding medicines are outlined above.  Medication changes, Labs and Tests ordered today are listed in the Patient Instructions below. Patient Instructions  Medication Instructions:  Your physician recommends that you continue on your current medications as directed. Please refer to the Current Medication list given to you today.  If you need a refill on your cardiac medications before your next appointment, please call your pharmacy.   Lab work: NONE   If you have labs (blood work) drawn today and your tests are completely normal, you will receive your results only by: Marland Kitchen MyChart Message (if you have MyChart) OR . A paper copy in the mail If you have any lab test that is abnormal or we need to change your  treatment, we will call you to review the results.  Testing/Procedures: NONE   Follow-Up: At [facility], you and your health needs are our priority.  As part of our continuing mission to provide you with exceptional heart care, we have created designated Provider Care Teams.  These Care Teams include your primary Cardiologist (physician) and Advanced Practice Providers (APPs -  Physician Assistants and Nurse Practitioners) who all work together to provide you with the care you need, when you need it. You will need a follow up appointment in 1 years.  Please call our office 2 months in advance to schedule this appointment.  You may see Kate Sable, MD or one of the following Advanced Practice Providers on your designated Care Team:   Bernerd Pho, PA-C [facility]) . Ermalinda Barrios, PA-C ([place])  Any Other Special Instructions Will Be Listed Below (If Applicable). Thank you for choosing [facility]!    Signed, Erma Heritage, PA-C  11/19/2017 4:45 PM    [address] [address] [place], [place] [address] Phone: [phone]

## 2017-11-19 NOTE — Patient Instructions (Signed)
Medication Instructions:  Your physician recommends that you continue on your current medications as directed. Please refer to the Current Medication list given to you today.  If you need a refill on your cardiac medications before your next appointment, please call your pharmacy.   Lab work: NONE  If you have labs (blood work) drawn today and your tests are completely normal, you will receive your results only by: . MyChart Message (if you have MyChart) OR . A paper copy in the mail If you have any lab test that is abnormal or we need to change your treatment, we will call you to review the results.  Testing/Procedures: NONE   Follow-Up: At [facility], you and your health needs are our priority.  As part of our continuing mission to provide you with exceptional heart care, we have created designated Provider Care Teams.  These Care Teams include your primary Cardiologist (physician) and Advanced Practice Providers (APPs -  Physician Assistants and Nurse Practitioners) who all work together to provide you with the care you need, when you need it. You will need a follow up appointment in 1 years.  Please call our office 2 months in advance to schedule this appointment.  You may see Suresh Koneswaran, MD or one of the following Advanced Practice Providers on your designated Care Team:   Brittany Strader, PA-C ([place] [facility]) . Michele Lenze, PA-C ([place] [facility])  Any Other Special Instructions Will Be Listed Below (If Applicable). Thank you for choosing [facility]!     

## 2017-12-29 ENCOUNTER — Encounter (INDEPENDENT_AMBULATORY_CARE_PROVIDER_SITE_OTHER): Payer: Self-pay

## 2018-01-13 ENCOUNTER — Ambulatory Visit (INDEPENDENT_AMBULATORY_CARE_PROVIDER_SITE_OTHER): Payer: PPO | Admitting: Internal Medicine

## 2018-01-20 ENCOUNTER — Ambulatory Visit (INDEPENDENT_AMBULATORY_CARE_PROVIDER_SITE_OTHER): Payer: PPO | Admitting: Internal Medicine

## 2018-01-20 ENCOUNTER — Encounter (INDEPENDENT_AMBULATORY_CARE_PROVIDER_SITE_OTHER): Payer: Self-pay | Admitting: Internal Medicine

## 2018-01-20 VITALS — BP 139/88 | HR 65 | Temp 97.9°F | Resp 18 | Ht 71.0 in | Wt 256.6 lb

## 2018-01-20 DIAGNOSIS — R1310 Dysphagia, unspecified: Secondary | ICD-10-CM

## 2018-01-20 DIAGNOSIS — R1319 Other dysphagia: Secondary | ICD-10-CM

## 2018-01-20 DIAGNOSIS — K219 Gastro-esophageal reflux disease without esophagitis: Secondary | ICD-10-CM

## 2018-01-20 MED ORDER — PANTOPRAZOLE SODIUM 40 MG PO TBEC: 40.0000 mg | DELAYED_RELEASE_TABLET | Freq: Every day | ORAL | 3 refills | Status: DC

## 2018-01-20 NOTE — Patient Instructions (Signed)
Will request copy of recent blood work from Dr. Nolon Rod office. Remember to eat slowly and chew food thoroughly. Please call office of difficulty swallowing becomes more frequent in which case we will consider esophageal dilation.

## 2018-01-20 NOTE — Progress Notes (Addendum)
Presenting complaint;  Follow-up for GERD. History of iron deficiency anemia.  Database and subjective:  James Norris is 68 year old Caucasian male who has a history of chronic GERD with Schatzki's ring as well as history of iron deficiency anemia(March 2014) was anemia corrected with p.o. iron was here for scheduled visit. He was last seen 1 year ago. He is having intermittent dysphagia.  He states he has swallowing difficulty when he forgets to take pantoprazole.  He also has difficulty swallowing when he eats very spicy food.  He is having maybe 1 episode a week.  Food bolus usually passes distally after few minutes but he has had few occasions where he had to regurgitate food.  He is not having frequent heartburn sore throat hoarseness or lump in his throat.  He is not having any side effects with pantoprazole.  His appetite is good.  His bowels move regularly.  He denies melena or rectal bleeding.  Last colonoscopy was normal in March 2014 when he was found to have iron deficiency anemia.  EGD in December 2012 revealed stricture at [place] junction with esophagitis.  It was dilated to 18 mm.  He also had moderate size sliding hiatal hernia.  EGD in March 2014 revealed healed esophagitis ring at GE junction was dilated to 19 mm with balloon he had moderate size sliding hiatal hernia felt erosions at the level of hiatus and antrum.  H. pylori serology was negative.  EGD in February 2017 revealed Schatzki's ring with moderate size sliding hiatal hernia.  Ring was dilated to 20 mm with a balloon.   Current Medications: Outpatient Encounter Medications as of 01/20/2018  Medication Sig  . ALPRAZolam (XANAX) 0.5 MG tablet Take 1 tablet by mouth 3 (three) times daily as needed for anxiety.   Marland Kitchen atorvastatin (LIPITOR) 40 MG tablet Take 1 tablet (40 mg total) by mouth at bedtime.  . ferrous sulfate 325 (65 FE) MG EC tablet Take 325 mg by mouth daily with breakfast.   . Multiple Vitamin (MULTIVITAMIN) tablet  Take 1 tablet by mouth daily.  . pantoprazole (PROTONIX) 40 MG tablet TAKE ONE TABLET BY MOUTH ONCE DAILY   No facility-administered encounter medications on file as of 01/20/2018.      Objective: Blood pressure 139/88, pulse 65, temperature 97.9 F (36.6 C), temperature source Oral, resp. rate 18, height 5\' 11"  (1.803 m), weight 256 lb 9.6 oz (116.4 kg). Patient is alert and in no acute distress. Conjunctiva is pink. Sclera is nonicteric Oropharyngeal mucosa is normal. Dentition in satisfactory condition. No neck masses or thyromegaly noted. Cardiac exam with regular rhythm normal S1 and S2. No murmur or gallop noted. Lungs are clear to auscultation. Abdomen is full but soft and nontender with organomegaly or masses. No LE edema or clubbing noted.  Labs/studies Results:  H&H 16.0 and 46.6 on 11/20/2016.  Recent blood work not available at the time of this dictation.   Assessment:  #1.  Chronic GERD.  He has moderate sized sliding hiatal hernia.  Given that he has history of erosive reflux esophagitis he needs to take PPI daily and definitely needs to make changes in his diet.  He should avoid spicy foods as much as he can.  #2.  Esophageal dysphagia.  He has undergone 3 dilations in the past(2012 2014 and 2017) if dysphagia persists despite him taking PPI daily he may need repeat dilation.  #3.  History of iron deficiency anemia presumed to be due to impaired iron absorption due to PPI.  H&H 14 months ago was normal and reportedly recent H&H was normal as well.  He is up-to-date on screening for CRC.  Plan:  Antireflux measures reinforced. Patient should try to avoid spicy foods that induce dysphagia. He should take pantoprazole 40 mg daily before breakfast. Will request copy of recent blood work from Dr. Vita Barley office. If dysphagia worsens will proceed with EGD with dilation under monitored anesthesia care. Office visit in 1 year.   Addendum:  Blood work from  11/19/2017 reviewed. H&H 16.1 and 46.4. ALT is 49.  It was 23 on 11/20/2016. AST 26 and alkaline phosphatase 133; AP was 116 on 11/30/2016 Results reviewed with James Norris over the phone. Suspect he may have fatty liver. We will repeat LFTs in 1 month.

## 2018-01-26 ENCOUNTER — Other Ambulatory Visit: Payer: Self-pay | Admitting: Internal Medicine

## 2018-01-26 ENCOUNTER — Ambulatory Visit (HOSPITAL_COMMUNITY)
Admission: RE | Admit: 2018-01-26 | Discharge: 2018-01-26 | Disposition: A | Discharge status: Home / Self Care | Payer: PPO | Source: Ambulatory Visit | Attending: Internal Medicine | Admitting: Internal Medicine

## 2018-01-26 DIAGNOSIS — R911 Solitary pulmonary nodule: Secondary | ICD-10-CM | POA: Diagnosis not present

## 2018-01-26 DIAGNOSIS — J432 Centrilobular emphysema: Secondary | ICD-10-CM | POA: Diagnosis not present

## 2018-01-26 DIAGNOSIS — R918 Other nonspecific abnormal finding of lung field: Secondary | ICD-10-CM

## 2018-01-27 ENCOUNTER — Encounter: Payer: Self-pay | Admitting: Internal Medicine

## 2018-01-27 ENCOUNTER — Ambulatory Visit: Payer: PPO | Admitting: Internal Medicine

## 2018-01-27 VITALS — BP 128/74 | HR 77 | Ht 71.0 in | Wt 261.8 lb

## 2018-01-27 DIAGNOSIS — R918 Other nonspecific abnormal finding of lung field: Secondary | ICD-10-CM

## 2018-01-27 DIAGNOSIS — I700 Atherosclerosis of aorta: Secondary | ICD-10-CM

## 2018-01-27 DIAGNOSIS — R911 Solitary pulmonary nodule: Secondary | ICD-10-CM | POA: Diagnosis not present

## 2018-01-27 NOTE — Progress Notes (Signed)
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 Progressive enlargement of left lower lobe nodule which currently measures 10 x 9 mm. This is concerning for primary bronchogenic neoplasm.  PET scan 06/16/2017- IMPRESSION: 1. Left lower lobe 1.0 cm solid pulmonary nodule is non hypermetabolic, more suggestive of a benign etiology. Recommend continued chest CT surveillance in 6-12 months, as a low-grade neoplasm cannot be excluded by PET  ------------------------------------------------------------------ 07/07/2017-68 year old male former heavy smoker followed for bilateral lung nodules with particular interest in left lower lobe nodule. Lung Nodules: Review PFT with patient.  Some recent stress because wife had a mild stroke, but his own health is been stable We reviewed his PFT showing only minimal obstructive disease despite his remote heavy smoking. We reviewed his CT and PET scan with minimal change or activity, intending follow-up CT in 6 months.  He was comfortable with this approach.  We discussed his hiatal hernia and other incidental findings from his imaging studies, which he can review with his PCP. We discussed atherosclerosis and aortic valve calcifications seen on imaging and he is interested in cardiology referral for evaluation. PFT-07/07/17-minimal obstruction, slight reduction of diffusion CT chest HiRes 06/10/17- IMPRESSION: 1. Progressive enlargement of left lower lobe nodule which currently measures 10 x 9 mm. This is concerning for primary bronchogenic neoplasm. Further evaluation with PET-CT is recommended. 2. No findings of interstitial lung disease. However, there is mild diffuse bronchial wall thickening with mild centrilobular and paraseptal emphysema;  imaging findings suggestive of underlying COPD. 3. Moderate to large hiatal hernia. 4. Aortic atherosclerosis, in addition to left main and 3 vessel coronary artery disease. Please note that although the presence of coronary artery calcium documents the presence of coronary artery disease, the severity of this disease and any potential stenosis cannot be assessed on this non-gated CT examination. Assessment for potential risk factor modification, dietary therapy or pharmacologic therapy may be warranted, if clinically indicated. 5. There are calcifications of the aortic valve. Echocardiographic correlation for evaluation of potential valvular dysfunction may be warranted if clinically indicated. 6. Mild hepatic steatosis. PET scan 06/16/2017- IMPRESSION: 1. Left lower lobe 1.0 cm solid pulmonary nodule is non hypermetabolic, more suggestive of a benign etiology. Recommend continued chest CT surveillance in 6-12 months, as a low-grade neoplasm cannot be excluded by PET. 2. No hypermetabolic disease. 3. Chronic findings include: Aortic Atherosclerosis (ICD10-I70.0). Coronary atherosclerosis. Nonobstructing bilateral nephrolithiasis. Moderate hiatal hernia. Moderate sigmoid diverticulosis. Mild diffuse bladder wall thickening, nonspecific, potentially due to chronic bladder outlet obstruction by the markedly enlarged Prostate.  01/27/2018- 68 year old male former heavy smoker followed for bilateral lung nodules with particular interest in left lower lobe nodule. -----Lung Nodules: Review HRCT Chest with patient. Pt denies any issues with breathing.  He denies breathing problems now and feels stable with no acute issues. We had sent him the CT scan report and I made a point of noting the atherosclerotic cardiovascular disease, pointing out that this imaging cannot assess for obstruction.  We agreed that he would talk with Dr. Gerarda Fraction about cardiac risk assessment because he has a strong family  history. Also imaged are a moderately large hiatal hernia and enlarged prostate.  These issues can also be discussed with his PCP. We reviewed the CT imaging of lung nodules, noting that they are currently looking benign but  need long-term follow-up. CT chest 01/26/2018-  IMPRESSION: 1. New 6 mm right upper lobe nodule. Slightly clustered appearance suggest an infectious or inflammatory etiology. Follow-up standard CT chest without contrast in 3-6 months is recommended as malignancy can not be excluded. 2. 10 mm left lower lobe nodule, stable from 06/10/2017 and slightly enlarged from older exams. A benign lesion is still favored. This can be reassessed on follow-up imaging recommended above. 3. No evidence of interstitial lung disease. 4. Moderate hiatal hernia. 5. Aortic atherosclerosis (ICD10-170.0). Coronary artery calcification. 6.  Emphysema (ICD10-J43.9).  ROS-see HPI   + = positive Constitutional:    weight loss, night sweats, fevers, chills, fatigue, lassitude. HEENT:    headaches, difficulty swallowing, tooth/dental problems, sore throat,       sneezing, itching, ear ache, nasal congestion, post nasal drip, snoring CV:    chest pain, orthopnea, PND, swelling in lower extremities, anasarca,                                             dizziness, palpitations Resp:   +shortness of breath with exertion or at rest.                productive cough,   non-productive cough, coughing up of blood.              change in color of mucus.  wheezing.   Skin:    rash or lesions. GI:  No-   heartburn, indigestion, abdominal pain, nausea, vomiting, diarrhea,                 change in bowel habits, loss of appetite GU: dysuria, change in color of urine, no urgency or frequency.   flank pain. MS:   joint pain, stiffness, decreased range of motion, back pain. Neuro-     nothing unusual Psych:  change in mood or affect.  depression or anxiety.   memory loss.  OBJ- Physical Exam General-  Alert, Oriented, Affect-appropriate, Distress- none acute, + obese Skin- rash-none, lesions- none, excoriation- none Lymphadenopathy- none Head- atraumatic            Eyes- Gross vision intact, PERRLA, conjunctivae and secretions clear            Ears- Hearing, canals-normal            Nose- Clear, no-Septal dev, mucus, polyps, erosion, perforation             Throat- Mallampati II-III , mucosa clear , drainage- none, tonsils- atrophic Neck- flexible , trachea midline, no stridor , thyroid nl, carotid no bruit Chest - symmetrical excursion , unlabored           Heart/CV- RRR , no murmur , no gallop  , no rub, nl s1 s2                           - JVD- none , edema- none, stasis changes- none, varices- none           Lung- clear to P&A, wheeze- none, cough- none , dullness-none, rub- none           Chest wall-  Abd-  Br/ Gen/ Rectal- Not done, not indicated Extrem- cyanosis- none, clubbing, none, atrophy- none, strength- nl Neuro- grossly intact to observation

## 2018-01-27 NOTE — Patient Instructions (Signed)
Order- schedule CT chest, no contrast future- 6 months, with ov to follow.  Please discuss the other findings on your CT scan with Dr Gerarda Fraction.

## 2018-01-27 NOTE — Assessment & Plan Note (Signed)
He is not symptomatic but has significant family history and personal risk factors.  I have asked him to speak with his primary physician about evaluation.

## 2018-01-27 NOTE — Assessment & Plan Note (Signed)
Left lower lobe nodule appears stable since 06/10/2017.  New 6 mm right upper lobe nodule.  Appearance and association with other longstanding nodules is encouraging.  I talked with him about long-term potential for developing cancer, possibly from a lesion not yet apparent.  He had a very heavy smoking history in the past.  We discussed the need to balance imaging with risk/benefit considerations of radiation and cost from CT. Plan-chest CT in 6 months.

## 2018-02-08 DIAGNOSIS — N200 Calculus of kidney: Secondary | ICD-10-CM | POA: Insufficient documentation

## 2018-02-09 DIAGNOSIS — N138 Other obstructive and reflux uropathy: Secondary | ICD-10-CM | POA: Diagnosis not present

## 2018-02-09 DIAGNOSIS — N401 Enlarged prostate with lower urinary tract symptoms: Secondary | ICD-10-CM | POA: Diagnosis not present

## 2018-02-09 DIAGNOSIS — R972 Elevated prostate specific antigen [PSA]: Secondary | ICD-10-CM | POA: Diagnosis not present

## 2018-02-09 DIAGNOSIS — N200 Calculus of kidney: Secondary | ICD-10-CM | POA: Diagnosis not present

## 2018-02-09 DIAGNOSIS — N529 Male erectile dysfunction, unspecified: Secondary | ICD-10-CM | POA: Diagnosis not present

## 2018-02-12 ENCOUNTER — Other Ambulatory Visit (INDEPENDENT_AMBULATORY_CARE_PROVIDER_SITE_OTHER): Payer: Self-pay | Admitting: *Deleted

## 2018-02-12 DIAGNOSIS — E7800 Pure hypercholesterolemia, unspecified: Secondary | ICD-10-CM

## 2018-03-04 ENCOUNTER — Other Ambulatory Visit (INDEPENDENT_AMBULATORY_CARE_PROVIDER_SITE_OTHER): Payer: Self-pay | Admitting: *Deleted

## 2018-03-04 ENCOUNTER — Encounter (INDEPENDENT_AMBULATORY_CARE_PROVIDER_SITE_OTHER): Payer: Self-pay | Admitting: *Deleted

## 2018-03-04 DIAGNOSIS — E7800 Pure hypercholesterolemia, unspecified: Secondary | ICD-10-CM

## 2018-03-13 DIAGNOSIS — E7800 Pure hypercholesterolemia, unspecified: Secondary | ICD-10-CM | POA: Diagnosis not present

## 2018-03-13 LAB — HEPATIC FUNCTION PANEL
AG Ratio: 2.1 (calc) (ref 1.0–2.5)
ALT: 54 U/L — ABNORMAL HIGH (ref 9–46)
AST: 32 U/L (ref 10–35)
Albumin: 4.4 g/dL (ref 3.6–5.1)
Alkaline phosphatase (APISO): 90 U/L (ref 35–144)
Bilirubin, Direct: 0.2 mg/dL (ref 0.0–0.2)
Globulin: 2.1 g/dL (calc) (ref 1.9–3.7)
Indirect Bilirubin: 0.7 mg/dL (calc) (ref 0.2–1.2)
TOTAL PROTEIN: 6.5 g/dL (ref 6.1–8.1)
Total Bilirubin: 0.9 mg/dL (ref 0.2–1.2)

## 2018-03-16 ENCOUNTER — Other Ambulatory Visit (INDEPENDENT_AMBULATORY_CARE_PROVIDER_SITE_OTHER): Payer: Self-pay | Admitting: *Deleted

## 2018-03-16 ENCOUNTER — Encounter (INDEPENDENT_AMBULATORY_CARE_PROVIDER_SITE_OTHER): Payer: Self-pay

## 2018-03-16 DIAGNOSIS — R945 Abnormal results of liver function studies: Principal | ICD-10-CM

## 2018-03-16 DIAGNOSIS — R7989 Other specified abnormal findings of blood chemistry: Secondary | ICD-10-CM

## 2018-03-23 ENCOUNTER — Ambulatory Visit (HOSPITAL_COMMUNITY): Payer: PPO

## 2018-03-25 ENCOUNTER — Ambulatory Visit (HOSPITAL_COMMUNITY)
Admission: RE | Admit: 2018-03-25 | Discharge: 2018-03-25 | Disposition: A | Discharge status: Home / Self Care | Payer: PPO | Source: Ambulatory Visit | Attending: Internal Medicine | Admitting: Internal Medicine

## 2018-03-25 ENCOUNTER — Other Ambulatory Visit: Payer: Self-pay

## 2018-03-25 DIAGNOSIS — H18413 Arcus senilis, bilateral: Secondary | ICD-10-CM | POA: Diagnosis not present

## 2018-03-25 DIAGNOSIS — H2513 Age-related nuclear cataract, bilateral: Secondary | ICD-10-CM | POA: Diagnosis not present

## 2018-03-25 DIAGNOSIS — H2511 Age-related nuclear cataract, right eye: Secondary | ICD-10-CM | POA: Diagnosis not present

## 2018-03-25 DIAGNOSIS — H35372 Puckering of macula, left eye: Secondary | ICD-10-CM | POA: Diagnosis not present

## 2018-03-25 DIAGNOSIS — H25013 Cortical age-related cataract, bilateral: Secondary | ICD-10-CM | POA: Diagnosis not present

## 2018-03-25 DIAGNOSIS — N281 Cyst of kidney, acquired: Secondary | ICD-10-CM | POA: Diagnosis not present

## 2018-03-25 DIAGNOSIS — R945 Abnormal results of liver function studies: Secondary | ICD-10-CM | POA: Diagnosis not present

## 2018-03-25 DIAGNOSIS — H25043 Posterior subcapsular polar age-related cataract, bilateral: Secondary | ICD-10-CM | POA: Diagnosis not present

## 2018-03-25 DIAGNOSIS — R7989 Other specified abnormal findings of blood chemistry: Secondary | ICD-10-CM

## 2018-04-01 ENCOUNTER — Encounter (INDEPENDENT_AMBULATORY_CARE_PROVIDER_SITE_OTHER): Payer: PPO | Admitting: Ophthalmology

## 2018-04-08 ENCOUNTER — Other Ambulatory Visit: Payer: Self-pay

## 2018-04-08 ENCOUNTER — Encounter (INDEPENDENT_AMBULATORY_CARE_PROVIDER_SITE_OTHER): Payer: PPO | Admitting: Ophthalmology

## 2018-04-08 DIAGNOSIS — I6523 Occlusion and stenosis of bilateral carotid arteries: Secondary | ICD-10-CM | POA: Diagnosis not present

## 2018-04-08 DIAGNOSIS — R002 Palpitations: Secondary | ICD-10-CM | POA: Diagnosis not present

## 2018-04-08 DIAGNOSIS — G319 Degenerative disease of nervous system, unspecified: Secondary | ICD-10-CM | POA: Diagnosis not present

## 2018-04-08 DIAGNOSIS — H43813 Vitreous degeneration, bilateral: Secondary | ICD-10-CM

## 2018-04-08 DIAGNOSIS — H2513 Age-related nuclear cataract, bilateral: Secondary | ICD-10-CM | POA: Diagnosis not present

## 2018-04-08 DIAGNOSIS — G96191 Perineural cyst: Secondary | ICD-10-CM | POA: Diagnosis not present

## 2018-04-08 DIAGNOSIS — H35372 Puckering of macula, left eye: Secondary | ICD-10-CM | POA: Diagnosis not present

## 2018-04-08 DIAGNOSIS — G9589 Other specified diseases of spinal cord: Secondary | ICD-10-CM | POA: Diagnosis not present

## 2018-04-08 DIAGNOSIS — R270 Ataxia, unspecified: Secondary | ICD-10-CM | POA: Diagnosis not present

## 2018-04-08 DIAGNOSIS — H33022 Retinal detachment with multiple breaks, left eye: Secondary | ICD-10-CM

## 2018-04-08 DIAGNOSIS — E782 Mixed hyperlipidemia: Secondary | ICD-10-CM | POA: Diagnosis not present

## 2018-04-20 ENCOUNTER — Other Ambulatory Visit: Payer: Self-pay

## 2018-04-20 ENCOUNTER — Encounter (INDEPENDENT_AMBULATORY_CARE_PROVIDER_SITE_OTHER): Payer: PPO | Admitting: Ophthalmology

## 2018-04-20 DIAGNOSIS — H33302 Unspecified retinal break, left eye: Secondary | ICD-10-CM

## 2018-04-29 ENCOUNTER — Encounter (INDEPENDENT_AMBULATORY_CARE_PROVIDER_SITE_OTHER): Payer: Self-pay | Admitting: *Deleted

## 2018-04-29 ENCOUNTER — Other Ambulatory Visit (INDEPENDENT_AMBULATORY_CARE_PROVIDER_SITE_OTHER): Payer: Self-pay | Admitting: *Deleted

## 2018-04-29 DIAGNOSIS — R945 Abnormal results of liver function studies: Principal | ICD-10-CM

## 2018-04-29 DIAGNOSIS — R7989 Other specified abnormal findings of blood chemistry: Secondary | ICD-10-CM

## 2018-06-16 DIAGNOSIS — H10021 Other mucopurulent conjunctivitis, right eye: Secondary | ICD-10-CM | POA: Diagnosis not present

## 2018-07-23 ENCOUNTER — Telehealth: Payer: Self-pay | Admitting: Internal Medicine

## 2018-07-23 DIAGNOSIS — R300 Dysuria: Secondary | ICD-10-CM | POA: Diagnosis not present

## 2018-07-23 NOTE — Telephone Encounter (Signed)
error 

## 2018-07-27 ENCOUNTER — Ambulatory Visit (HOSPITAL_COMMUNITY): Payer: PPO

## 2018-07-28 ENCOUNTER — Ambulatory Visit: Payer: PPO | Admitting: Internal Medicine

## 2018-07-28 DIAGNOSIS — N411 Chronic prostatitis: Secondary | ICD-10-CM | POA: Diagnosis not present

## 2018-08-10 NOTE — Progress Notes (Deleted)
'@Patient'  ID: James Norris, male    DOB: 07-09-1950, 68 y.o.   MRN: [id number]  No chief complaint on file.   Referring provider: Redmond School, MD  HPI:  68 year old male former smoker followed in our office for emphysema as well as pulmonary nodules that we are following on CT imaging  PMH:  Smoker/ Smoking History: Former smoker Maintenance:   Pt of: Dr. Annamaria Norris  male former heavy smoker, James Norris,  followed for bilateral lung nodules with particular interest in left lower lobe nodule  08/10/2018  - Visit   HPI  Tests:   01/26/2018-CT chest high-res-new 6 mm right upper lobe nodule slightly clustered appearing suggesting infectious or inflammatory etiology, follow-up standard CT chest without contrast in 3 to 6 months is recommended, 10 mm left lower lobe nodule stable from 06/10/2017 and slightly enlarged from older exams, a benign lesion is still favored this can be reassessed on follow-up imaging recommended above, no evidence of ILD, hiatal hernia, emphysema  06/17/2017-PET scan- left lower lobe 1 cm solid pulmonary nodules non-hypermetabolic more suggestive of benign etiology, recommend CT chest surveillance in 6 to 12 months as low-grade neoplasm cannot be excluded, no hypermetabolic disease,  1/51/7616-[id number]- LV ejection fraction 60 to 65%, mild LVH, PA P pressure 17  07/07/2017-pulmonary function test-FVC 3.87 (82% predicted), postbronchodilator ratio 77, postbronchodilator FEV1 3.01 (86% predicted), no postbronchodilator response, DLCO 79  FENO:  No results found for: NITRICOXIDE  PFT: PFT Results Latest Ref Rng & Units 07/07/2017  FVC-Pre L 3.87  FVC-Predicted Pre % 82  FVC-Post L 3.91  FVC-Predicted Post % 83  Pre FEV1/FVC % % 79  Post FEV1/FCV % % 77  FEV1-Pre L 3.05  FEV1-Predicted Pre % 87  FEV1-Post L 3.01  DLCO UNC% % 79  DLCO COR %Predicted % 94  TLC L 5.77  TLC % Predicted % 79  RV % Predicted % 72    Imaging: No results found.     Specialty Problems      Pulmonary Problems   Multiple lung nodules on CT    CT chest 02/11/12 CT chest 03/27/17 CT chest HiRes 06/10/17- PET scan 06/16/2017- CT chest  01/26/2018      Hiatal hernia with GERD      Allergies  Allergen Reactions  . Penicillins     Has patient had a PCN reaction causing immediate rash, facial/tongue/throat swelling, SOB or lightheadedness with hypotension: {Yes/No:[id number]}unknown Has patient had a PCN reaction causing severe rash involving mucus membranes or skin necrosis: {Yes/No:[id number]}unknown Has patient had a PCN reaction that required hospitalization {Yes/No:[id number]}unknown Has patient had a PCN reaction occurring within the last 10 years: {Yes/No:[id number]}unknown If all of the above answers are "NO", then may proceed    Immunization History  Administered Date(s) Administered  . Influenza, High Dose Seasonal PF 10/07/2017  . Influenza,inj,quad, With Preservative 11/13/2016    Past Medical History:  Diagnosis Date  . GERD (gastroesophageal reflux disease)   . Hyperlipemia   . Kidney stone   . Sleep apnea     Tobacco History: Social History   Tobacco Use  Smoking Status Former Smoker  . Packs/day: 1.50  . Types: Cigarettes  . Quit date: 08/27/2010  . Years since quitting: 7.9  Smokeless Tobacco Never Used  Tobacco Comment   quit 7 yrs ago   Counseling given: Not Answered Comment: quit 7 yrs ago   Continue to not smoke  Outpatient Encounter Medications as of 08/11/2018  Medication Sig  . ALPRAZolam (XANAX) 0.5  MG tablet Take 1 tablet by mouth 3 (three) times daily as needed for anxiety.   Marland Kitchen atorvastatin (LIPITOR) 40 MG tablet Take 1 tablet (40 mg total) by mouth at bedtime.  . ferrous sulfate 325 (65 FE) MG EC tablet Take 325 mg by mouth daily with breakfast.   . Multiple Vitamin (MULTIVITAMIN) tablet Take 1 tablet by mouth daily.  . pantoprazole (PROTONIX) 40 MG tablet Take 1 tablet (40 mg total) by mouth daily before  breakfast.   No facility-administered encounter medications on file as of 08/11/2018.      Review of Systems  Review of Systems   Physical Exam  There were no vitals taken for this visit.  Wt Readings from Last 5 Encounters:  01/27/18 261 lb 12.8 oz (118.8 kg)  01/20/18 256 lb 9.6 oz (116.4 kg)  11/19/17 260 lb (117.9 kg)  07/24/17 255 lb (115.7 kg)  07/07/17 254 lb (115.2 kg)     Physical Exam   Lab Results:  CBC    Component Value Date/Time   WBC 13.0 (H) 03/27/2010 1700   RBC 5.03 03/27/2010 1700   HGB 14.4 03/27/2010 1700   HCT 43.1 03/27/2010 1700   PLT 313 03/27/2010 1700   MCV 85.7 03/27/2010 1700   MCH 28.6 03/27/2010 1700   MCHC 33.4 03/27/2010 1700   RDW 13.0 03/27/2010 1700   LYMPHSABS 2.2 03/27/2010 1700   MONOABS 1.3 (H) 03/27/2010 1700   EOSABS 0.1 03/27/2010 1700   BASOSABS 0.0 03/27/2010 1700    BMET    Component Value Date/Time   NA 138 10/10/2017 1201   K 4.4 10/10/2017 1201   CL 104 10/10/2017 1201   CO2 26 10/10/2017 1201   GLUCOSE 83 10/10/2017 1201   BUN 15 10/10/2017 1201   CREATININE 0.98 10/10/2017 1201   CALCIUM 9.2 10/10/2017 1201   GFRNONAA 39 (L) 03/27/2010 1700   GFRAA (L) 03/27/2010 1700    47        The eGFR has been calculated using the MDRD equation. This calculation has not been validated in all clinical situations. eGFR's persistently <60 mL/min signify possible Chronic Kidney Disease.    BNP No results found for: BNP  ProBNP No results found for: PROBNP    Assessment & Plan:   No problem-specific Assessment & Plan notes found for this encounter.    No follow-ups on file.   James Rinne, NP 08/10/2018   This appointment was *** minutes long with over 50% of the time in direct face-to-face patient care, assessment, plan of care, and follow-up.

## 2018-08-11 ENCOUNTER — Ambulatory Visit (HOSPITAL_COMMUNITY)
Admission: RE | Admit: 2018-08-11 | Discharge: 2018-08-11 | Disposition: A | Discharge status: Home / Self Care | Payer: PPO | Source: Ambulatory Visit | Attending: Internal Medicine | Admitting: Internal Medicine

## 2018-08-11 ENCOUNTER — Other Ambulatory Visit: Payer: Self-pay

## 2018-08-11 ENCOUNTER — Ambulatory Visit: Payer: PPO | Admitting: Pulmonary Disease

## 2018-08-11 DIAGNOSIS — R918 Other nonspecific abnormal finding of lung field: Secondary | ICD-10-CM | POA: Diagnosis not present

## 2018-08-11 DIAGNOSIS — R911 Solitary pulmonary nodule: Secondary | ICD-10-CM

## 2018-08-12 ENCOUNTER — Ambulatory Visit (INDEPENDENT_AMBULATORY_CARE_PROVIDER_SITE_OTHER): Payer: PPO | Admitting: Internal Medicine

## 2018-08-12 ENCOUNTER — Encounter: Payer: Self-pay | Admitting: Internal Medicine

## 2018-08-12 DIAGNOSIS — I700 Atherosclerosis of aorta: Secondary | ICD-10-CM

## 2018-08-12 DIAGNOSIS — R918 Other nonspecific abnormal finding of lung field: Secondary | ICD-10-CM

## 2018-08-12 NOTE — Assessment & Plan Note (Signed)
He will continue to watch this with his PCP.

## 2018-08-12 NOTE — Progress Notes (Signed)
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 Progressive enlargement of left lower lobe nodule which currently measures 10 x 9 mm. This is concerning for primary bronchogenic neoplasm.  PET scan 06/16/2017- IMPRESSION: 1. Left lower lobe 1.0 cm solid pulmonary nodule is non hypermetabolic, more suggestive of a benign etiology. Recommend continued chest CT surveillance in 6-12 months, as a low-grade neoplasm cannot be excluded by PET  ------------------------------------------------------------------  01/27/2018- 68 year old male former heavy smoker followed for bilateral lung nodules with particular interest in left lower lobe nodule. -----Lung Nodules: Review HRCT Chest with patient. Pt denies any issues with breathing.  He denies breathing problems now and feels stable with no acute issues. We had sent him the CT scan report and I made a point of noting the atherosclerotic cardiovascular disease, pointing out that this imaging cannot assess for obstruction.  We agreed that he would talk with Dr. Gerarda Fraction about cardiac risk assessment because he has a strong family history. Also imaged are a moderately large hiatal hernia and enlarged prostate.  These issues can also be discussed with his PCP. We reviewed the CT imaging of lung nodules, noting that they are currently looking benign but need long-term follow-up. CT chest 01/26/2018-  IMPRESSION: 1. New 6 mm right upper lobe nodule. Slightly clustered appearance suggest an infectious or inflammatory etiology. Follow-up standard CT chest without contrast in 3-6 months is recommended as malignancy can not be excluded. 2. 10 mm  left lower lobe nodule, stable from 06/10/2017 and slightly enlarged from older exams. A benign lesion is still favored. This can be reassessed on follow-up imaging recommended above. 3. No evidence of interstitial lung disease. 4. Moderate hiatal hernia. 5. Aortic atherosclerosis (ICD10-170.0). Coronary artery calcification. 6.  Emphysema (ICD10-J43.9).  08/12/2018- 68 year old male former heavy smoker followed for bilateral lung nodules with particular interest in left lower lobe nodule, complicated by BPH, Aortic Atherosclerosis, Hiatal Hernia with GERD, High cholesterol,  -----F/U after CT 08/11/2018 He has been hiking in [place] w/o problems. No chest complaints.  CT findings reviewed, consistent with benign process. Discussed possibility still that LLL nodule might eventually change . CT chest- 08/11/2018 IMPRESSION: 1. Interval resolution of the right upper lobe pulmonary nodule. This was likely an inflammatory or infectious lesion. 2. Interval decrease in size of the right lower lobe pulmonary nodule also likely inflammatory. 3. Stable left lower lobe pulmonary nodule consistent with benign process. 4. No mediastinal or hilar mass or adenopathy. 5. Stable moderate to large hiatal hernia.  ROS-see HPI   + = positive Constitutional:    weight loss, night sweats, fevers, chills, fatigue, lassitude. HEENT:    headaches, difficulty swallowing, tooth/dental problems, sore throat,       sneezing, itching, ear ache, nasal congestion, post nasal drip, snoring CV:    chest pain, orthopnea, PND, swelling in lower extremities, anasarca,                                             dizziness, palpitations Resp:   +shortness of breath with exertion or at rest.  productive cough,   non-productive cough, coughing up of blood.              change in color of mucus.  wheezing.   Skin:    rash or lesions. GI:  No-   heartburn, indigestion, abdominal pain, nausea, vomiting,  diarrhea,                 change in bowel habits, loss of appetite GU: dysuria, change in color of urine, no urgency or frequency.   flank pain. MS:   joint pain, stiffness, decreased range of motion, back pain. Neuro-     nothing unusual Psych:  change in mood or affect.  depression or anxiety.   memory loss.  OBJ- Physical Exam General- Alert, Oriented, Affect-appropriate, Distress- none acute, + obese Skin- rash-none, lesions- none, excoriation- none Lymphadenopathy- none Head- atraumatic            Eyes- Gross vision intact, PERRLA, conjunctivae and secretions clear            Ears- Hearing, canals-normal            Nose- Clear, no-Septal dev, mucus, polyps, erosion, perforation             Throat- Mallampati II-III , mucosa clear , drainage- none, tonsils- atrophic Neck- flexible , trachea midline, no stridor , thyroid nl, carotid no bruit Chest - symmetrical excursion , unlabored           Heart/CV- RRR , no murmur , no gallop  , no rub, nl s1 s2                           - JVD- none , edema- none, stasis changes- none, varices- none           Lung- clear to P&A, wheeze- none, cough- none , dullness-none, rub- none           Chest wall-  Abd-  Br/ Gen/ Rectal- Not done, not indicated Extrem- cyanosis- none, clubbing, none, atrophy- none, strength- nl Neuro- grossly intact to observation

## 2018-08-12 NOTE — Patient Instructions (Signed)
Glad CT report looks good. I suggest we consider another look in a year or two.  Please call if we can help

## 2018-08-12 NOTE — Assessment & Plan Note (Signed)
2 nodules have regressed, indicating inflammation. LLL nodule is stable long enough to be probably benign, but with his long smoking hx I recommended we follow, perhaps with CXR in a year.

## 2018-08-20 ENCOUNTER — Other Ambulatory Visit: Payer: Self-pay

## 2018-08-20 ENCOUNTER — Encounter (INDEPENDENT_AMBULATORY_CARE_PROVIDER_SITE_OTHER): Payer: PPO | Admitting: Ophthalmology

## 2018-08-20 DIAGNOSIS — H35373 Puckering of macula, bilateral: Secondary | ICD-10-CM

## 2018-08-20 DIAGNOSIS — H43813 Vitreous degeneration, bilateral: Secondary | ICD-10-CM | POA: Diagnosis not present

## 2018-08-20 DIAGNOSIS — H2513 Age-related nuclear cataract, bilateral: Secondary | ICD-10-CM

## 2018-08-20 DIAGNOSIS — H33302 Unspecified retinal break, left eye: Secondary | ICD-10-CM | POA: Diagnosis not present

## 2018-08-31 DIAGNOSIS — N529 Male erectile dysfunction, unspecified: Secondary | ICD-10-CM | POA: Diagnosis not present

## 2018-08-31 DIAGNOSIS — N138 Other obstructive and reflux uropathy: Secondary | ICD-10-CM | POA: Diagnosis not present

## 2018-08-31 DIAGNOSIS — N200 Calculus of kidney: Secondary | ICD-10-CM | POA: Diagnosis not present

## 2018-08-31 DIAGNOSIS — N401 Enlarged prostate with lower urinary tract symptoms: Secondary | ICD-10-CM | POA: Diagnosis not present

## 2018-08-31 DIAGNOSIS — R972 Elevated prostate specific antigen [PSA]: Secondary | ICD-10-CM | POA: Diagnosis not present

## 2018-09-11 DIAGNOSIS — E291 Testicular hypofunction: Secondary | ICD-10-CM | POA: Diagnosis not present

## 2018-09-16 DIAGNOSIS — H25043 Posterior subcapsular polar age-related cataract, bilateral: Secondary | ICD-10-CM | POA: Diagnosis not present

## 2018-09-16 DIAGNOSIS — H35372 Puckering of macula, left eye: Secondary | ICD-10-CM | POA: Diagnosis not present

## 2018-09-16 DIAGNOSIS — H25013 Cortical age-related cataract, bilateral: Secondary | ICD-10-CM | POA: Diagnosis not present

## 2018-09-16 DIAGNOSIS — H18413 Arcus senilis, bilateral: Secondary | ICD-10-CM | POA: Diagnosis not present

## 2018-09-16 DIAGNOSIS — H2513 Age-related nuclear cataract, bilateral: Secondary | ICD-10-CM | POA: Diagnosis not present

## 2018-09-16 DIAGNOSIS — H2512 Age-related nuclear cataract, left eye: Secondary | ICD-10-CM | POA: Diagnosis not present

## 2018-10-13 DIAGNOSIS — H2512 Age-related nuclear cataract, left eye: Secondary | ICD-10-CM | POA: Diagnosis not present

## 2018-10-13 DIAGNOSIS — H25812 Combined forms of age-related cataract, left eye: Secondary | ICD-10-CM | POA: Diagnosis not present

## 2018-10-14 DIAGNOSIS — H2511 Age-related nuclear cataract, right eye: Secondary | ICD-10-CM | POA: Diagnosis not present

## 2018-10-14 DIAGNOSIS — H25011 Cortical age-related cataract, right eye: Secondary | ICD-10-CM | POA: Diagnosis not present

## 2018-10-14 DIAGNOSIS — H25041 Posterior subcapsular polar age-related cataract, right eye: Secondary | ICD-10-CM | POA: Diagnosis not present

## 2018-11-11 ENCOUNTER — Encounter (INDEPENDENT_AMBULATORY_CARE_PROVIDER_SITE_OTHER): Payer: PPO | Admitting: Ophthalmology

## 2018-11-11 DIAGNOSIS — H2511 Age-related nuclear cataract, right eye: Secondary | ICD-10-CM | POA: Diagnosis not present

## 2018-11-11 DIAGNOSIS — H35372 Puckering of macula, left eye: Secondary | ICD-10-CM | POA: Diagnosis not present

## 2018-11-11 DIAGNOSIS — H43813 Vitreous degeneration, bilateral: Secondary | ICD-10-CM

## 2018-11-11 DIAGNOSIS — H33302 Unspecified retinal break, left eye: Secondary | ICD-10-CM

## 2018-12-02 ENCOUNTER — Other Ambulatory Visit: Payer: Self-pay

## 2019-01-02 ENCOUNTER — Encounter (HOSPITAL_COMMUNITY): Payer: Self-pay | Admitting: Emergency Medicine

## 2019-01-02 ENCOUNTER — Emergency Department (HOSPITAL_COMMUNITY): Payer: PPO

## 2019-01-02 ENCOUNTER — Other Ambulatory Visit: Payer: Self-pay

## 2019-01-02 ENCOUNTER — Emergency Department (HOSPITAL_COMMUNITY)
Admission: EM | Admit: 2019-01-02 | Discharge: 2019-01-02 | Disposition: A | Discharge status: Home / Self Care | Payer: PPO | Attending: Emergency Medicine | Admitting: Emergency Medicine

## 2019-01-02 DIAGNOSIS — Z87891 Personal history of nicotine dependence: Secondary | ICD-10-CM | POA: Insufficient documentation

## 2019-01-02 DIAGNOSIS — R109 Unspecified abdominal pain: Secondary | ICD-10-CM | POA: Diagnosis present

## 2019-01-02 DIAGNOSIS — N132 Hydronephrosis with renal and ureteral calculous obstruction: Secondary | ICD-10-CM | POA: Diagnosis not present

## 2019-01-02 DIAGNOSIS — N201 Calculus of ureter: Secondary | ICD-10-CM | POA: Insufficient documentation

## 2019-01-02 DIAGNOSIS — Z79899 Other long term (current) drug therapy: Secondary | ICD-10-CM | POA: Diagnosis not present

## 2019-01-02 LAB — URINALYSIS, ROUTINE W REFLEX MICROSCOPIC
Bacteria, UA: NONE SEEN
Bilirubin Urine: NEGATIVE
Glucose, UA: NEGATIVE mg/dL
Ketones, ur: NEGATIVE mg/dL
Nitrite: NEGATIVE
Protein, ur: NEGATIVE mg/dL
RBC / HPF: >50 RBC/hpf — ABNORMAL HIGH (ref 0–5)
Specific Gravity, Urine: 1.019 (ref 1.005–1.030)
pH: 6 (ref 5.0–8.0)

## 2019-01-02 MED ORDER — TAMSULOSIN HCL 0.4 MG PO CAPS: 0.4000 mg | ORAL_CAPSULE | Freq: Every day | ORAL | 0 refills | Status: DC

## 2019-01-02 MED ORDER — OXYCODONE-ACETAMINOPHEN 5-325 MG PO TABS: 1.0000 | ORAL_TABLET | Freq: Four times a day (QID) | ORAL | 0 refills | Status: AC | PRN

## 2019-01-02 MED ORDER — NAPROXEN 500 MG PO TABS: 500.0000 mg | ORAL_TABLET | Freq: Two times a day (BID) | ORAL | 0 refills | Status: DC

## 2019-01-02 MED ORDER — ONDANSETRON 4 MG PO TBDP: 4.0000 mg | ORAL_TABLET | Freq: Three times a day (TID) | ORAL | 0 refills | Status: DC | PRN

## 2019-01-02 NOTE — Discharge Instructions (Addendum)
Your CT scan shows that you have a kidney stone on the left which is about 7 mm large, this has a smaller chance of passing spontaneously however if you use the following medications this will reduce your pain and help with the past faster.  Naproxen, every 12 hours as needed for pain Oxycodone, 1 to 2 tablets every 6 hours as needed for severe pain Zofran, 4 mg every 6 hours as needed for nausea Flomax, 0.4 mg daily for the next 5 days  Please follow-up with the urologist of your choice, I have given you the phone number for Dr. Alyson Ingles who is on-call today.  Emergency department for increasing severe or worsening fever vomiting chills abdominal pain back pain or any other severe or worsening symptoms.

## 2019-01-02 NOTE — ED Triage Notes (Signed)
Pt states he woke up with left flank pain this morning which has resolved at this time.

## 2019-01-02 NOTE — ED Provider Notes (Signed)
[facility] EMERGENCY DEPARTMENT Provider Note   CSN: [id number] Arrival date & time: 01/02/19  [id number]     History Chief Complaint  Patient presents with  . Flank Pain    James Norris is a 68 y.o. male.  HPI   This patient is a 68 year old male, he has a history of kidney stone in the past as well as a history of hyperlipidemia, he has never had an abdominal aortic aneurysm he presents with a complaint today of left-sided flank pain that occurred abruptly 5 or 6 hours ago.  He was out of town and decided to come back here to be evaluated.  He reports that since that time the pain seem to have resolved, there is a still slight ache in the left lower back.  No nausea or vomiting, he did have some associated brownish colored urine.  At this time the patient is essentially asymptomatic.  Despite his pain getting much better after consultation with his family member who is a resident physician he was wisely encouraged to come to the hospital for further evaluation.  I have reviewed the patient's prior chart, it appears that he has had prior pulmonary nodules, multiple CT scans to evaluate for that, no CT scans of the abdomen or pelvis in approximately 10 years.  Past Medical History:  Diagnosis Date  . GERD (gastroesophageal reflux disease)   . Hyperlipemia   . Kidney stone   . Sleep apnea     Patient Active Problem List   Diagnosis Date Noted  . Aortic atherosclerosis ([place]) 01/27/2018  . Hiatal hernia with GERD 07/07/2017  . BPH (benign prostatic hyperplasia) 07/07/2017  . Tobacco abuse, in remission 04/05/2017  . Heme positive stool 02/10/2012  . Anemia 02/10/2012  . High cholesterol 02/10/2012  . Multiple lung nodules on CT 02/10/2012  . Dysphagia 02/10/2012    Past Surgical History:  Procedure Laterality Date  . BALLOON DILATION N/A 03/11/2012   Procedure: BALLOON DILATION;  Surgeon: Rogene Houston, MD;  [facility];  Service: Endoscopy;  Laterality: N/A;    . COLONOSCOPY WITH ESOPHAGOGASTRODUODENOSCOPY (EGD) N/A 03/11/2012   Procedure: COLONOSCOPY WITH ESOPHAGOGASTRODUODENOSCOPY (EGD);  Surgeon: Rogene Houston, MD;  Location: [facility];  Service: Endoscopy;  Laterality: N/A;  1200  . ESOPHAGEAL DILATION N/A 02/15/2015   Procedure: ESOPHAGEAL DILATION;  Surgeon: Rogene Houston, MD;  Location: [facility];  Service: Endoscopy;  Laterality: N/A;  . ESOPHAGOGASTRODUODENOSCOPY N/A 02/15/2015   Procedure: ESOPHAGOGASTRODUODENOSCOPY (EGD);  Surgeon: Rogene Houston, MD;  Location: [facility];  Service: Endoscopy;  Laterality: N/A;  2:00  . EYE SURGERY     lasik- bilaterally  . TONSILLECTOMY         Family History  Problem Relation Age of Onset  . Dementia Mother   . Breast cancer Mother   . Coronary artery disease Mother   . Coronary artery disease Father   . Hypertension Father   . Diabetes type II Father   . Hyperlipidemia Father   . GER disease Father     Social History   Tobacco Use  . Smoking status: Former Smoker    Packs/day: 1.50    Types: Cigarettes    Quit date: 08/27/2010    Years since quitting: 8.3  . Smokeless tobacco: Never Used  . Tobacco comment: quit 7 yrs ago  Substance Use Topics  . Alcohol use: Yes    Alcohol/week: 0.0 standard drinks    Comment: social rare  .  Drug use: No    Home Medications Prior to Admission medications   Medication Sig Start Date End Date Taking? Authorizing Provider  ALPRAZolam Duanne Moron) 0.5 MG tablet Take 1 tablet by mouth 3 (three) times daily as needed for anxiety.  12/13/11  Yes [provider]  atorvastatin (LIPITOR) 20 MG tablet Take 20 mg by mouth daily. 12/01/18  Yes [provider]  ferrous sulfate 325 (65 FE) MG EC tablet Take 325 mg by mouth daily with breakfast.    Yes [provider]  Multiple Vitamin (MULTIVITAMIN) tablet Take 1 tablet by mouth daily.   Yes [provider]  pantoprazole (PROTONIX) 40 MG tablet Take 1 tablet (40 mg  total) by mouth daily before breakfast. 01/20/18  Yes Rehman, Mechele Dawley, MD  naproxen (NAPROSYN) 500 MG tablet Take 1 tablet (500 mg total) by mouth 2 (two) times daily with a meal. 01/02/19   Noemi Chapel, MD  ondansetron (ZOFRAN ODT) 4 MG disintegrating tablet Take 1 tablet (4 mg total) by mouth every 8 (eight) hours as needed for nausea. 01/02/19   Noemi Chapel, MD  oxyCODONE-acetaminophen (PERCOCET) 5-325 MG tablet Take 1-2 tablets by mouth every 6 (six) hours as needed for up to 3 days. 01/02/19 01/05/19  Noemi Chapel, MD  tamsulosin (FLOMAX) 0.4 MG CAPS capsule Take 1 capsule (0.4 mg total) by mouth daily for 5 days. 01/02/19 01/07/19  Noemi Chapel, MD    Allergies    Penicillins  Review of Systems   Review of Systems  All other systems reviewed and are negative.   Physical Exam Updated Vital Signs BP (!) 168/103 (BP Location: Right Arm)   Pulse 82   Temp 97.8 F (36.6 C) (Oral)   Resp 18   Ht 1.829 m (6')   Wt 117.9 kg   SpO2 98%   BMI 35.26 kg/m   Physical Exam Vitals and nursing note reviewed.  Constitutional:      General: He is not in acute distress.    Appearance: He is well-developed.  HENT:     Head: Normocephalic and atraumatic.     Mouth/Throat:     Pharynx: No oropharyngeal exudate.  Eyes:     General: No scleral icterus.       Right eye: No discharge.        Left eye: No discharge.     Conjunctiva/sclera: Conjunctivae normal.     Pupils: Pupils are equal, round, and reactive to light.  Neck:     Thyroid: No thyromegaly.     Vascular: No JVD.  Cardiovascular:     Rate and Rhythm: Normal rate and regular rhythm.     Heart sounds: Normal heart sounds. No murmur. No friction rub. No gallop.   Pulmonary:     Effort: Pulmonary effort is normal. No respiratory distress.     Breath sounds: Normal breath sounds. No wheezing or rales.  Abdominal:     General: Bowel sounds are normal. There is no distension.     Palpations: Abdomen is soft. There is no  mass.     Tenderness: There is no abdominal tenderness.     Comments: No palpable mass, no pulsating mass, mild left-sided CVA tenderness  Musculoskeletal:        General: No tenderness. Normal range of motion.     Cervical back: Normal range of motion and neck supple.  Lymphadenopathy:     Cervical: No cervical adenopathy.  Skin:    General: Skin is warm and dry.  Findings: No erythema or rash.  Neurological:     Mental Status: He is alert.     Coordination: Coordination normal.     Comments: Normal gait, normal speech, normal coordination  Psychiatric:        Behavior: Behavior normal.     ED Results / Procedures / Treatments   Labs (all labs ordered are listed, but only abnormal results are displayed) Labs Reviewed  URINALYSIS, ROUTINE W REFLEX MICROSCOPIC - Abnormal; Notable for the following components:      Result Value   Hgb urine dipstick LARGE (*)    Leukocytes,Ua TRACE (*)    RBC / HPF >50 (*)    All other components within normal limits    EKG None  Radiology CT Renal Stone Study  Result Date: 01/02/2019 CLINICAL DATA:  Left flank pain. EXAM: CT ABDOMEN AND PELVIS WITHOUT CONTRAST TECHNIQUE: Multidetector CT imaging of the abdomen and pelvis was performed following the standard protocol without IV contrast. COMPARISON:  03/27/2010 FINDINGS: Lower chest: 10 mm lobular nodule in the left lower lobe is stable compared to chest CT of 08/11/2018. This nodule measured 9 mm on the cardiac CT of 10/17/2017 and was 5 mm on a chest CT of 02/11/2012. Hepatobiliary: No suspicious focal abnormality within the liver parenchyma. There is no evidence for gallstones, gallbladder wall thickening, or pericholecystic fluid. No intrahepatic or extrahepatic biliary dilation. Pancreas: No focal mass lesion. No dilatation of the main duct. No intraparenchymal cyst. No peripancreatic edema. Spleen: No splenomegaly. No focal mass lesion. Adrenals/Urinary Tract: No adrenal nodule or mass. 5  mm nonobstructing stone noted lower pole right kidney. No right ureteral stone. No secondary changes in the right kidney or ureter. No left renal stones although there is mild left hydronephrosis. 7 x 7 x 4 mm stone identified in the proximal left ureter (axial 46/series 2). No other left urinary stone. 2.1 cm water density lesion lower pole left kidney is compatible with a cyst. No bladder stone. Stomach/Bowel: Small to moderate hiatal hernia. Duodenum is normally positioned as is the ligament of Treitz. No small bowel wall thickening. No small bowel dilatation. The terminal ileum is normal. The appendix is best seen on coronal images and is unremarkable. No gross colonic mass. No colonic wall thickening. Diverticular changes are noted in the left colon without evidence of diverticulitis. Vascular/Lymphatic: There is abdominal aortic atherosclerosis without aneurysm. There is no gastrohepatic or hepatoduodenal ligament lymphadenopathy. No retroperitoneal or mesenteric lymphadenopathy. No pelvic sidewall lymphadenopathy. Reproductive: Prostate gland is markedly enlarged. Other: No intraperitoneal free fluid. Musculoskeletal: No worrisome lytic or sclerotic osseous abnormality. IMPRESSION: 1. 7 x 7 x 4 mm stone identified in the proximal left ureter with mild left hydronephrosis. 2. 5 mm nonobstructing right renal stone. No other urinary stone disease evident on today's study. 3. 10 mm lobular nodule left lower lobe has shown slow growth comparing back over almost 7 years to a study from 2014. Given the growth characteristics, this is probably benign. Conservatively, follow-up CT chest in 12 months could be used to reassess. 4. Small to moderate hiatal hernia. 5. Marked prostatomegaly. 6.  Aortic Atherosclerois (ICD10-170.0) Electronically Signed   By: Misty Stanley M.D.   On: 01/02/2019 10:53    Procedures Procedures (including critical care time)  Medications Ordered in ED Medications - No data to  display  ED Course  I have reviewed the triage vital signs and the nursing notes.  Pertinent labs & imaging results that were available during my care  of the patient were reviewed by me and considered in my medical decision making (see chart for details).    MDM Rules/Calculators/A&P                      The patient is in no distress, well-appearing, urinalysis pending, suspect renal stone versus other cause.  The patient is concerned about aneurysm, thankfully CT scan which showed both.  He is agreeable to the plan and not in need of medication at this time.  This patient is now symptom-free, CT scan was performed and all of the results were shared with the patient.  No signs of infection in the urine, CT scan shows mild hydronephrosis with 7 mm kidney stone in the proximal ureter.  He is agreeable to follow-up in the outpatient setting, he has a primary urologist but has been given the local phone number in case he cannot see them.  No signs of aneurysm or other complicating factors  Final Clinical Impression(s) / ED Diagnoses Final diagnoses:  Ureterolithiasis    Rx / DC Orders ED Discharge Orders         Ordered    tamsulosin (FLOMAX) 0.4 MG CAPS capsule  Daily     01/02/19 1143    naproxen (NAPROSYN) 500 MG tablet  2 times daily with meals     01/02/19 1143    oxyCODONE-acetaminophen (PERCOCET) 5-325 MG tablet  Every 6 hours PRN     01/02/19 1143    ondansetron (ZOFRAN ODT) 4 MG disintegrating tablet  Every 8 hours PRN     01/02/19 1143           Noemi Chapel, MD 01/02/19 1145

## 2019-01-03 ENCOUNTER — Other Ambulatory Visit: Payer: Self-pay

## 2019-01-03 DIAGNOSIS — K219 Gastro-esophageal reflux disease without esophagitis: Secondary | ICD-10-CM | POA: Insufficient documentation

## 2019-01-03 DIAGNOSIS — Z20828 Contact with and (suspected) exposure to other viral communicable diseases: Secondary | ICD-10-CM | POA: Diagnosis not present

## 2019-01-03 DIAGNOSIS — N132 Hydronephrosis with renal and ureteral calculous obstruction: Secondary | ICD-10-CM | POA: Diagnosis not present

## 2019-01-03 DIAGNOSIS — Z79899 Other long term (current) drug therapy: Secondary | ICD-10-CM | POA: Insufficient documentation

## 2019-01-03 DIAGNOSIS — Z8349 Family history of other endocrine, nutritional and metabolic diseases: Secondary | ICD-10-CM | POA: Insufficient documentation

## 2019-01-03 DIAGNOSIS — E785 Hyperlipidemia, unspecified: Secondary | ICD-10-CM | POA: Diagnosis not present

## 2019-01-03 DIAGNOSIS — N202 Calculus of kidney with calculus of ureter: Secondary | ICD-10-CM | POA: Diagnosis not present

## 2019-01-03 DIAGNOSIS — Z87442 Personal history of urinary calculi: Secondary | ICD-10-CM | POA: Diagnosis not present

## 2019-01-03 DIAGNOSIS — N200 Calculus of kidney: Secondary | ICD-10-CM | POA: Diagnosis not present

## 2019-01-03 DIAGNOSIS — Z87891 Personal history of nicotine dependence: Secondary | ICD-10-CM | POA: Insufficient documentation

## 2019-01-03 DIAGNOSIS — Z03818 Encounter for observation for suspected exposure to other biological agents ruled out: Secondary | ICD-10-CM | POA: Diagnosis not present

## 2019-01-03 DIAGNOSIS — N400 Enlarged prostate without lower urinary tract symptoms: Secondary | ICD-10-CM | POA: Diagnosis not present

## 2019-01-03 DIAGNOSIS — G4730 Sleep apnea, unspecified: Secondary | ICD-10-CM | POA: Insufficient documentation

## 2019-01-03 DIAGNOSIS — F419 Anxiety disorder, unspecified: Secondary | ICD-10-CM | POA: Diagnosis not present

## 2019-01-04 ENCOUNTER — Emergency Department (HOSPITAL_COMMUNITY): Payer: PPO

## 2019-01-04 ENCOUNTER — Encounter (HOSPITAL_COMMUNITY)
Admission: EM | Disposition: A | Discharge status: Home / Self Care | Payer: Self-pay | Source: Home / Self Care | Attending: Emergency Medicine

## 2019-01-04 ENCOUNTER — Other Ambulatory Visit: Payer: Self-pay | Admitting: Urology

## 2019-01-04 ENCOUNTER — Encounter (HOSPITAL_COMMUNITY): Payer: Self-pay

## 2019-01-04 ENCOUNTER — Emergency Department (HOSPITAL_COMMUNITY)
Admission: EM | Admit: 2019-01-04 | Discharge: 2019-01-04 | Disposition: A | Discharge status: Home / Self Care | Payer: PPO | Source: Home / Self Care

## 2019-01-04 ENCOUNTER — Ambulatory Visit (HOSPITAL_COMMUNITY)
Admission: EM | Admit: 2019-01-04 | Discharge: 2019-01-04 | Disposition: A | Discharge status: Home / Self Care | Payer: PPO | Attending: Urology | Admitting: Urology

## 2019-01-04 ENCOUNTER — Other Ambulatory Visit: Payer: Self-pay

## 2019-01-04 DIAGNOSIS — N201 Calculus of ureter: Secondary | ICD-10-CM | POA: Diagnosis not present

## 2019-01-04 DIAGNOSIS — N202 Calculus of kidney with calculus of ureter: Secondary | ICD-10-CM | POA: Diagnosis not present

## 2019-01-04 DIAGNOSIS — N200 Calculus of kidney: Secondary | ICD-10-CM

## 2019-01-04 HISTORY — PX: EXTRACORPOREAL SHOCK WAVE LITHOTRIPSY: SHX1557

## 2019-01-04 LAB — BASIC METABOLIC PANEL
Anion gap: 7 (ref 5–15)
BUN: 16 mg/dL (ref 8–23)
CO2: 27 mmol/L (ref 22–32)
Calcium: 9.4 mg/dL (ref 8.9–10.3)
Chloride: 106 mmol/L (ref 98–111)
Creatinine, Ser: 1.27 mg/dL — ABNORMAL HIGH (ref 0.61–1.24)
GFR calc Af Amer: >60 mL/min (ref >60)
GFR calc non Af Amer: 57 mL/min — ABNORMAL LOW (ref >60)
Glucose, Bld: 158 mg/dL — ABNORMAL HIGH (ref 70–99)
Potassium: 4.5 mmol/L (ref 3.5–5.1)
Sodium: 140 mmol/L (ref 135–145)

## 2019-01-04 LAB — URINALYSIS, ROUTINE W REFLEX MICROSCOPIC
Bacteria, UA: NONE SEEN
Bilirubin Urine: NEGATIVE
Glucose, UA: NEGATIVE mg/dL
Ketones, ur: NEGATIVE mg/dL
Leukocytes,Ua: NEGATIVE
Nitrite: NEGATIVE
Protein, ur: NEGATIVE mg/dL
Specific Gravity, Urine: 1.02 (ref 1.005–1.030)
pH: 5 (ref 5.0–8.0)

## 2019-01-04 LAB — CBC
HCT: 49 % (ref 39.0–52.0)
Hemoglobin: 16 g/dL (ref 13.0–17.0)
MCH: 29.1 pg (ref 26.0–34.0)
MCHC: 32.7 g/dL (ref 30.0–36.0)
MCV: 89.3 fL (ref 80.0–100.0)
Platelets: 277 10*3/uL (ref 150–400)
RBC: 5.49 MIL/uL (ref 4.22–5.81)
RDW: 12.2 % (ref 11.5–15.5)
WBC: 14.7 10*3/uL — ABNORMAL HIGH (ref 4.0–10.5)
nRBC: 0 % (ref 0.0–0.2)

## 2019-01-04 LAB — URINE CULTURE: Culture: NO GROWTH

## 2019-01-04 LAB — RESPIRATORY PANEL BY RT PCR (FLU A&B, COVID)
Influenza A by PCR: NEGATIVE
Influenza B by PCR: NEGATIVE
SARS Coronavirus 2 by RT PCR: NEGATIVE

## 2019-01-04 SURGERY — LITHOTRIPSY, ESWL
Anesthesia: LOCAL | Laterality: Left

## 2019-01-04 MED ORDER — MORPHINE SULFATE (PF) 4 MG/ML IV SOLN: 4.0000 mg | INTRAVENOUS | Status: DC | PRN

## 2019-01-04 MED ORDER — ONDANSETRON HCL 4 MG/2ML IJ SOLN: 4.0000 mg | Freq: Four times a day (QID) | INTRAMUSCULAR | Status: DC | PRN

## 2019-01-04 MED ORDER — FENTANYL CITRATE (PF) 100 MCG/2ML IJ SOLN
50.0000 ug | Freq: Once | INTRAMUSCULAR | Status: AC
  Administered 2019-01-04: 50 ug via INTRAVENOUS
  Filled 2019-01-04: qty 2

## 2019-01-04 MED ORDER — SODIUM CHLORIDE 0.9 % IV SOLN: INTRAVENOUS | Status: DC

## 2019-01-04 MED ORDER — MORPHINE SULFATE (PF) 4 MG/ML IV SOLN
4.0000 mg | Freq: Once | INTRAVENOUS | Status: AC
  Administered 2019-01-04: 02:00:00 4 mg via INTRAVENOUS
  Filled 2019-01-04: qty 1

## 2019-01-04 MED ORDER — LACTATED RINGERS IV SOLN: INTRAVENOUS | Status: DC

## 2019-01-04 MED ORDER — SODIUM CHLORIDE 0.9 % IV BOLUS (SEPSIS)
1000.0000 mL | Freq: Once | INTRAVENOUS | Status: AC
  Administered 2019-01-04: 01:00:00 1000 mL via INTRAVENOUS

## 2019-01-04 MED ORDER — CIPROFLOXACIN HCL 500 MG PO TABS
500.0000 mg | ORAL_TABLET | ORAL | Status: AC
  Administered 2019-01-04: 500 mg via ORAL
  Filled 2019-01-04: qty 1

## 2019-01-04 MED ORDER — DIPHENHYDRAMINE HCL 25 MG PO CAPS
25.0000 mg | ORAL_CAPSULE | ORAL | Status: AC
  Administered 2019-01-04: 25 mg via ORAL
  Filled 2019-01-04: qty 1

## 2019-01-04 MED ORDER — ONDANSETRON HCL 4 MG/2ML IJ SOLN
4.0000 mg | Freq: Once | INTRAMUSCULAR | Status: AC
  Administered 2019-01-04: 4 mg via INTRAVENOUS
  Filled 2019-01-04: qty 2

## 2019-01-04 MED ORDER — DIAZEPAM 5 MG PO TABS
10.0000 mg | ORAL_TABLET | ORAL | Status: AC
  Administered 2019-01-04: 10 mg via ORAL
  Filled 2019-01-04: qty 2

## 2019-01-04 NOTE — H&P (Signed)
James Norris is an 68 y.o. male.    Chief Complaint: Pre-OP LEFT Shockwave Lithotripsy  HPI:   1 - LEFT Ureteral Stone -approx 43mm left mid stone just lateral to L5 by CT, then KUB 12/2018 on eval flank pain. SSD 17cm. 830HU. Cr 1.3, UA without infectious parameters.  Today "James Norris" is seen to proceed with LEFT shockwave lithotripsy for left ureteral stone with several days of colic. No intervl fevers. Took NSAIDS yesterday but stone out of renal shadow.     Past Medical History:  Diagnosis Date  . GERD (gastroesophageal reflux disease)   . Hyperlipemia   . Kidney stone   . Sleep apnea     Past Surgical History:  Procedure Laterality Date  . BALLOON DILATION N/A 03/11/2012   Procedure: BALLOON DILATION;  Surgeon: Rogene Houston, MD;  [facility];  Service: Endoscopy;  Laterality: N/A;  . COLONOSCOPY WITH ESOPHAGOGASTRODUODENOSCOPY (EGD) N/A 03/11/2012   Procedure: COLONOSCOPY WITH ESOPHAGOGASTRODUODENOSCOPY (EGD);  Surgeon: Rogene Houston, MD;  [facility];  Service: Endoscopy;  Laterality: N/A;  1200  . ESOPHAGEAL DILATION N/A 02/15/2015   Procedure: ESOPHAGEAL DILATION;  Surgeon: Rogene Houston, MD;  [facility];  Service: Endoscopy;  Laterality: N/A;  . ESOPHAGOGASTRODUODENOSCOPY N/A 02/15/2015   Procedure: ESOPHAGOGASTRODUODENOSCOPY (EGD);  Surgeon: Rogene Houston, MD;  [facility];  Service: Endoscopy;  Laterality: N/A;  2:00  . EYE SURGERY     lasik- bilaterally  . TONSILLECTOMY      Family History  Problem Relation Age of Onset  . Dementia Mother   . Breast cancer Mother   . Coronary artery disease Mother   . Coronary artery disease Father   . Hypertension Father   . Diabetes type II Father   . Hyperlipidemia Father   . GER disease Father    Social History:  reports that he quit smoking about 8 years ago. His smoking use included cigarettes. He smoked 1.50 packs per day. He has never used smokeless tobacco. He reports  current alcohol use. He reports that he does not use drugs.  Allergies:  PCN  (Not in a hospital admission)   Results for orders placed or performed during the hospital encounter of 01/04/19 (from the past 48 hour(s))  Basic metabolic panel     Status: Abnormal   Collection Time: 01/04/19 12:03 AM  Result Value Ref Range   Sodium 140 135 - 145 mmol/L   Potassium 4.5 3.5 - 5.1 mmol/L   Chloride 106 98 - 111 mmol/L   CO2 27 22 - 32 mmol/L   Glucose, Bld 158 (H) 70 - 99 mg/dL   BUN 16 8 - 23 mg/dL   Creatinine, Ser 1.27 (H) 0.61 - 1.24 mg/dL   Calcium 9.4 8.9 - 10.3 mg/dL   GFR calc non Af Amer 57 (L) >60 mL/min   GFR calc Af Amer >60 >60 mL/min   Anion gap 7 5 - 15    Comment: Performed at [facility], [place] [address] [place], [place] [address]  CBC     Status: Abnormal   Collection Time: 01/04/19 12:03 AM  Result Value Ref Range   WBC 14.7 (H) 4.0 - 10.5 K/uL   RBC 5.49 4.22 - 5.81 MIL/uL   Hemoglobin 16.0 13.0 - 17.0 g/dL   HCT 49.0 39.0 - 52.0 %   MCV 89.3 80.0 - 100.0 fL   MCH 29.1 26.0 - 34.0 pg   MCHC  32.7 30.0 - 36.0 g/dL   RDW 12.2 11.5 - 15.5 %   Platelets 277 150 - 400 K/uL   nRBC 0.0 0.0 - 0.2 %    Comment: Performed at [facility], [place] [address] [place], [place] [address]  Urinalysis, Routine w reflex microscopic- may I&O cath if menses     Status: Abnormal   Collection Time: 01/04/19  1:08 AM  Result Value Ref Range   Color, Urine YELLOW YELLOW   APPearance CLEAR CLEAR   Specific Gravity, Urine 1.020 1.005 - 1.030   pH 5.0 5.0 - 8.0   Glucose, UA NEGATIVE NEGATIVE mg/dL   Hgb urine dipstick MODERATE (A) NEGATIVE   Bilirubin Urine NEGATIVE NEGATIVE   Ketones, ur NEGATIVE NEGATIVE mg/dL   Protein, ur NEGATIVE NEGATIVE mg/dL   Nitrite NEGATIVE NEGATIVE   Leukocytes,Ua NEGATIVE NEGATIVE   RBC / HPF 21-50 0 - 5 RBC/hpf   WBC, UA 0-5 0 - 5 WBC/hpf   Bacteria, UA NONE SEEN NONE SEEN   Mucus PRESENT     Comment:  Performed at [facility], [place] [address], [place], [place] [address]   DG Abd 1 View  Result Date: 01/04/2019 CLINICAL DATA:  Left flank pain.  History of left ureteral calculus. EXAM: ABDOMEN - 1 VIEW COMPARISON:  CT scan 01/02/2019 FINDINGS: Stable appearing position of the left ureteral calculus located adjacent to the L4 vertebral body. Stable lower pole right renal calculus. The bowel gas pattern is unremarkable. Scattered air in stool noted in the colon. No free air. IMPRESSION: 1. Stable mid left ureteral calculus. 2. Right lower pole renal calculus. 3. Unremarkable bowel gas pattern. Electronically Signed   By: Marijo Sanes M.D.   On: 01/04/2019 06:39   CT Renal Stone Study  Result Date: 01/02/2019 CLINICAL DATA:  Left flank pain. EXAM: CT ABDOMEN AND PELVIS WITHOUT CONTRAST TECHNIQUE: Multidetector CT imaging of the abdomen and pelvis was performed following the standard protocol without IV contrast. COMPARISON:  03/27/2010 FINDINGS: Lower chest: 10 mm lobular nodule in the left lower lobe is stable compared to chest CT of 08/11/2018. This nodule measured 9 mm on the cardiac CT of 10/17/2017 and was 5 mm on a chest CT of 02/11/2012. Hepatobiliary: No suspicious focal abnormality within the liver parenchyma. There is no evidence for gallstones, gallbladder wall thickening, or pericholecystic fluid. No intrahepatic or extrahepatic biliary dilation. Pancreas: No focal mass lesion. No dilatation of the main duct. No intraparenchymal cyst. No peripancreatic edema. Spleen: No splenomegaly. No focal mass lesion. Adrenals/Urinary Tract: No adrenal nodule or mass. 5 mm nonobstructing stone noted lower pole right kidney. No right ureteral stone. No secondary changes in the right kidney or ureter. No left renal stones although there is mild left hydronephrosis. 7 x 7 x 4 mm stone identified in the proximal left ureter (axial 46/series 2). No other left urinary stone. 2.1 cm water density  lesion lower pole left kidney is compatible with a cyst. No bladder stone. Stomach/Bowel: Small to moderate hiatal hernia. Duodenum is normally positioned as is the ligament of Treitz. No small bowel wall thickening. No small bowel dilatation. The terminal ileum is normal. The appendix is best seen on coronal images and is unremarkable. No gross colonic mass. No colonic wall thickening. Diverticular changes are noted in the left colon without evidence of diverticulitis. Vascular/Lymphatic: There is abdominal aortic atherosclerosis without aneurysm. There is no gastrohepatic or hepatoduodenal ligament lymphadenopathy. No retroperitoneal or mesenteric lymphadenopathy. No pelvic sidewall  lymphadenopathy. Reproductive: Prostate gland is markedly enlarged. Other: No intraperitoneal free fluid. Musculoskeletal: No worrisome lytic or sclerotic osseous abnormality. IMPRESSION: 1. 7 x 7 x 4 mm stone identified in the proximal left ureter with mild left hydronephrosis. 2. 5 mm nonobstructing right renal stone. No other urinary stone disease evident on today's study. 3. 10 mm lobular nodule left lower lobe has shown slow growth comparing back over almost 7 years to a study from 2014. Given the growth characteristics, this is probably benign. Conservatively, follow-up CT chest in 12 months could be used to reassess. 4. Small to moderate hiatal hernia. 5. Marked prostatomegaly. 6.  Aortic Atherosclerois (ICD10-170.0) Electronically Signed   By: Misty Stanley M.D.   On: 01/02/2019 10:53    Review of Systems  Genitourinary: Positive for flank pain.  All other systems reviewed and are negative.   Blood pressure (!) 157/95, pulse 76, temperature 98 F (36.7 C), temperature source Oral, resp. rate 14, SpO2 96 %. Physical Exam  Constitutional: He appears well-developed.  HENT:  Head: Normocephalic.  Eyes: Pupils are equal, round, and reactive to light.  Cardiovascular: Normal rate.  Respiratory: Effort normal.  GI:   Mild truncal obesity.   Genitourinary:    Genitourinary Comments: Mild left CVAT at present.    Musculoskeletal:        General: Normal range of motion.     Cervical back: Normal range of motion.  Neurological: He is alert.  Skin: Skin is warm.  Psychiatric: He has a normal mood and affect.     Assessment/Plan  Proceed as planned with LEFT shockwave lithotripsy. Risks, benefits, alternatives, expected peri-procedure course discussed previously and reiterated again today.   Alexis Frock, MD 01/04/2019, 8:14 AM

## 2019-01-04 NOTE — ED Triage Notes (Signed)
Pt coming from home c/o severe pain in both flanks due to recent dx of kidney stones. Pt has had decrease urination today.

## 2019-01-04 NOTE — H&P (View-Only) (Signed)
Urology Consult  Referring physician: Dr. Leonides Schanz Reason for referral: left ureteral calculus  Chief Complaint: left flank pain  History of Present Illness: [name] is a 68yo with a hx of nephrolithiasis who presented to the [facility] ER with worsening left flank pain. He was diagnosed with a 28mm calculus on CT on 12/26 at [facility].  Since 12/26 he has noted worsening left flank pain that is sharp, constant, severe and nonradiaitng. The pain was alleviated with narcotic pain mediation. No LUTS. NO other associated symptoms. No other exacerbating/ alleviating events. KUB shows a left ureteral stone at L4 outside the renal shadow. He took naproxen yesterday. His last stone event was 7-8 years ago treated with ESWL by the Dr. Tresa Endo [facility].   Past Medical History:  Diagnosis Date  . GERD (gastroesophageal reflux disease)   . Hyperlipemia   . Kidney stone   . Sleep apnea    Past Surgical History:  Procedure Laterality Date  . BALLOON DILATION N/A 03/11/2012   Procedure: BALLOON DILATION;  Surgeon: Rogene Houston, MD;  Location: [facility] ENDO SUITE;  Service: Endoscopy;  Laterality: N/A;  . COLONOSCOPY WITH ESOPHAGOGASTRODUODENOSCOPY (EGD) N/A 03/11/2012   Procedure: COLONOSCOPY WITH ESOPHAGOGASTRODUODENOSCOPY (EGD);  Surgeon: Rogene Houston, MD;  Location: [facility];  Service: Endoscopy;  Laterality: N/A;  1200  . ESOPHAGEAL DILATION N/A 02/15/2015   Procedure: ESOPHAGEAL DILATION;  Surgeon: Rogene Houston, MD;  Location: [facility];  Service: Endoscopy;  Laterality: N/A;  . ESOPHAGOGASTRODUODENOSCOPY N/A 02/15/2015   Procedure: ESOPHAGOGASTRODUODENOSCOPY (EGD);  Surgeon: Rogene Houston, MD;  Location: [facility];  Service: Endoscopy;  Laterality: N/A;  2:00  . EYE SURGERY     lasik- bilaterally  . TONSILLECTOMY      Medications: I have reviewed the patient's current medications. Allergies:  PCN  Family History  Problem Relation Age of Onset  . Dementia Mother   . Breast cancer Mother   .  Coronary artery disease Mother   . Coronary artery disease Father   . Hypertension Father   . Diabetes type II Father   . Hyperlipidemia Father   . GER disease Father    Social History:  reports that he quit smoking about 8 years ago. His smoking use included cigarettes. He smoked 1.50 packs per day. He has never used smokeless tobacco. He reports current alcohol use. He reports that he does not use drugs.  Review of Systems  Genitourinary: Positive for flank pain.  All other systems reviewed and are negative.   Physical Exam:  Vital signs in last 24 hours: Temp:  [98 F (36.7 C)] 98 F (36.7 C) (12/27 2352) Pulse Rate:  [70-90] 76 (12/28 0700) Resp:  [13-22] 14 (12/28 0700) BP: (142-205)/(71-106) 157/95 (12/28 0700) SpO2:  [91 %-99 %] 96 % (12/28 0700) Physical Exam  Constitutional: He is oriented to person, place, and time. He appears well-developed and well-nourished.  HENT:  Head: Normocephalic and atraumatic.  Eyes: Pupils are equal, round, and reactive to light. EOM are normal.  Neck: No thyromegaly present.  Cardiovascular: Normal rate and regular rhythm.  Respiratory: Effort normal. No respiratory distress.  GI: Soft. He exhibits no distension.  Musculoskeletal:        General: No edema. Normal range of motion.     Cervical back: Normal range of motion.  Neurological: He is alert and oriented to person, place, and time.  Skin: Skin is warm and dry.  Psychiatric: He has a normal mood and affect.  His behavior is normal. Judgment and thought content normal.    Laboratory Data:  Results for orders placed or performed during the hospital encounter of 01/04/19 (from the past 72 hour(s))  Basic metabolic panel     Status: Abnormal   Collection Time: 01/04/19 12:03 AM  Result Value Ref Range   Sodium 140 135 - 145 mmol/L   Potassium 4.5 3.5 - 5.1 mmol/L   Chloride 106 98 - 111 mmol/L   CO2 27 22 - 32 mmol/L   Glucose, Bld 158 (H) 70 - 99 mg/dL   BUN 16 8 - 23 mg/dL    Creatinine, Ser 1.27 (H) 0.61 - 1.24 mg/dL   Calcium 9.4 8.9 - 10.3 mg/dL   GFR calc non Af Amer 57 (L) >60 mL/min   GFR calc Af Amer >60 >60 mL/min   Anion gap 7 5 - 15    Comment: Performed at [facility], [place] [address] [place], [place] [address]  CBC     Status: Abnormal   Collection Time: 01/04/19 12:03 AM  Result Value Ref Range   WBC 14.7 (H) 4.0 - 10.5 K/uL   RBC 5.49 4.22 - 5.81 MIL/uL   Hemoglobin 16.0 13.0 - 17.0 g/dL   HCT 49.0 39.0 - 52.0 %   MCV 89.3 80.0 - 100.0 fL   MCH 29.1 26.0 - 34.0 pg   MCHC 32.7 30.0 - 36.0 g/dL   RDW 12.2 11.5 - 15.5 %   Platelets 277 150 - 400 K/uL   nRBC 0.0 0.0 - 0.2 %    Comment: Performed at [facility], [place] [address] [place], [place] [address]  Urinalysis, Routine w reflex microscopic- may I&O cath if menses     Status: Abnormal   Collection Time: 01/04/19  1:08 AM  Result Value Ref Range   Color, Urine YELLOW YELLOW   APPearance CLEAR CLEAR   Specific Gravity, Urine 1.020 1.005 - 1.030   pH 5.0 5.0 - 8.0   Glucose, UA NEGATIVE NEGATIVE mg/dL   Hgb urine dipstick MODERATE (A) NEGATIVE   Bilirubin Urine NEGATIVE NEGATIVE   Ketones, ur NEGATIVE NEGATIVE mg/dL   Protein, ur NEGATIVE NEGATIVE mg/dL   Nitrite NEGATIVE NEGATIVE   Leukocytes,Ua NEGATIVE NEGATIVE   RBC / HPF 21-50 0 - 5 RBC/hpf   WBC, UA 0-5 0 - 5 WBC/hpf   Bacteria, UA NONE SEEN NONE SEEN   Mucus PRESENT     Comment: Performed at [facility] [place] [address] [place], [place] [address]   No results found for this or any previous visit (from the past 240 hour(s)). Creatinine: Recent Labs    01/04/19 0003  CREATININE 1.27*   Baseline Creatinine: 1  Impression/Assessment:  68yo with left proximal ureteral calculus  Plan:  We discussed the management including ureteral stent placement, ureteroscopy and ESWL and after discussing the options the patient elects for ESWL. Risks/benefits/alternatives  discussed  [name] 01/04/2019, 7:28 AM

## 2019-01-04 NOTE — ED Notes (Signed)
Called no answer

## 2019-01-04 NOTE — ED Notes (Signed)
Pt encouraged to void but states he is unable to do so. Pt has urinal at bedside.

## 2019-01-04 NOTE — ED Provider Notes (Signed)
Signout from Dr. Leonides Schanz.  68 year old male with history of kidney stones here for urologic procedure with Dr. Alyson Ingles. Physical Exam  BP (!) 157/95   Pulse 76   Temp 98 F (36.7 C) (Oral)   Resp 14   SpO2 96%   Physical Exam  ED Course/Procedures     Procedures  MDM  Patient is anticipated for procedure at 10 AM today.       Hayden Rasmussen, MD 01/04/19 [phone]

## 2019-01-04 NOTE — ED Notes (Signed)
Report given to OR.   Patient wiped down with HCG bath wipes and new gown placed.   Belongings placed in patient belongings bad.

## 2019-01-04 NOTE — ED Notes (Signed)
PT had output of 60ML. Bladder scan showed 41ML after voiding.

## 2019-01-04 NOTE — H&P (View-Only) (Signed)
Urology Consult  Referring physician: Dr. Leonides Schanz Reason for referral: left ureteral calculus  Chief Complaint: left flank pain  History of Present Illness: James Norris is a 68yo with a hx of nephrolithiasis who presented to the [facility] ER with worsening left flank pain. He was diagnosed with a 57mm calculus on CT on 12/26 at [facility].  Since 12/26 he has noted worsening left flank pain that is sharp, constant, severe and nonradiaitng. The pain was alleviated with narcotic pain mediation. No LUTS. NO other associated symptoms. No other exacerbating/ alleviating events. KUB shows a left ureteral stone at L4 outside the renal shadow. He took naproxen yesterday. His last stone event was 7-8 years ago treated with ESWL by the Dr. Tresa Endo [facility].   Past Medical History:  Diagnosis Date  . GERD (gastroesophageal reflux disease)   . Hyperlipemia   . Kidney stone   . Sleep apnea    Past Surgical History:  Procedure Laterality Date  . BALLOON DILATION N/A 03/11/2012   Procedure: BALLOON DILATION;  Surgeon: Rogene Houston, MD;  Location: [facility];  Service: Endoscopy;  Laterality: N/A;  . COLONOSCOPY WITH ESOPHAGOGASTRODUODENOSCOPY (EGD) N/A 03/11/2012   Procedure: COLONOSCOPY WITH ESOPHAGOGASTRODUODENOSCOPY (EGD);  Surgeon: Rogene Houston, MD;  Location: [facility];  Service: Endoscopy;  Laterality: N/A;  1200  . ESOPHAGEAL DILATION N/A 02/15/2015   Procedure: ESOPHAGEAL DILATION;  Surgeon: Rogene Houston, MD;  Location: [facility];  Service: Endoscopy;  Laterality: N/A;  . ESOPHAGOGASTRODUODENOSCOPY N/A 02/15/2015   Procedure: ESOPHAGOGASTRODUODENOSCOPY (EGD);  Surgeon: Rogene Houston, MD;  Location: [facility];  Service: Endoscopy;  Laterality: N/A;  2:00  . EYE SURGERY     lasik- bilaterally  . TONSILLECTOMY      Medications: I have reviewed the patient's current medications. Allergies:  PCN  Family History  Problem Relation Age of Onset  . Dementia Mother   . Breast cancer Mother   .  Coronary artery disease Mother   . Coronary artery disease Father   . Hypertension Father   . Diabetes type II Father   . Hyperlipidemia Father   . GER disease Father    Social History:  reports that he quit smoking about 8 years ago. His smoking use included cigarettes. He smoked 1.50 packs per day. He has never used smokeless tobacco. He reports current alcohol use. He reports that he does not use drugs.  Review of Systems  Genitourinary: Positive for flank pain.  All other systems reviewed and are negative.   Physical Exam:  Vital signs in last 24 hours: Temp:  [98 F (36.7 C)] 98 F (36.7 C) (12/27 2352) Pulse Rate:  [70-90] 76 (12/28 0700) Resp:  [13-22] 14 (12/28 0700) BP: (142-205)/(71-106) 157/95 (12/28 0700) SpO2:  [91 %-99 %] 96 % (12/28 0700) Physical Exam  Constitutional: He is oriented to person, place, and time. He appears well-developed and well-nourished.  HENT:  Head: Normocephalic and atraumatic.  Eyes: Pupils are equal, round, and reactive to light. EOM are normal.  Neck: No thyromegaly present.  Cardiovascular: Normal rate and regular rhythm.  Respiratory: Effort normal. No respiratory distress.  GI: Soft. He exhibits no distension.  Musculoskeletal:        General: No edema. Normal range of motion.     Cervical back: Normal range of motion.  Neurological: He is alert and oriented to person, place, and time.  Skin: Skin is warm and dry.  Psychiatric: He has a normal mood and affect.  His behavior is normal. Judgment and thought content normal.    Laboratory Data:  Results for orders placed or performed during the hospital encounter of 01/04/19 (from the past 72 hour(s))  Basic metabolic panel     Status: Abnormal   Collection Time: 01/04/19 12:03 AM  Result Value Ref Range   Sodium 140 135 - 145 mmol/L   Potassium 4.5 3.5 - 5.1 mmol/L   Chloride 106 98 - 111 mmol/L   CO2 27 22 - 32 mmol/L   Glucose, Bld 158 (H) 70 - 99 mg/dL   BUN 16 8 - 23 mg/dL    Creatinine, Ser 1.27 (H) 0.61 - 1.24 mg/dL   Calcium 9.4 8.9 - 10.3 mg/dL   GFR calc non Af Amer 57 (L) >60 mL/min   GFR calc Af Amer >60 >60 mL/min   Anion gap 7 5 - 15    Comment: Performed at [facility], [place] [address] [place], [place] [address]  CBC     Status: Abnormal   Collection Time: 01/04/19 12:03 AM  Result Value Ref Range   WBC 14.7 (H) 4.0 - 10.5 K/uL   RBC 5.49 4.22 - 5.81 MIL/uL   Hemoglobin 16.0 13.0 - 17.0 g/dL   HCT 49.0 39.0 - 52.0 %   MCV 89.3 80.0 - 100.0 fL   MCH 29.1 26.0 - 34.0 pg   MCHC 32.7 30.0 - 36.0 g/dL   RDW 12.2 11.5 - 15.5 %   Platelets 277 150 - 400 K/uL   nRBC 0.0 0.0 - 0.2 %    Comment: Performed at [facility], [place] [address], [place], [place] [address]  Urinalysis, Routine w reflex microscopic- may I&O cath if menses     Status: Abnormal   Collection Time: 01/04/19  1:08 AM  Result Value Ref Range   Color, Urine YELLOW YELLOW   APPearance CLEAR CLEAR   Specific Gravity, Urine 1.020 1.005 - 1.030   pH 5.0 5.0 - 8.0   Glucose, UA NEGATIVE NEGATIVE mg/dL   Hgb urine dipstick MODERATE (A) NEGATIVE   Bilirubin Urine NEGATIVE NEGATIVE   Ketones, ur NEGATIVE NEGATIVE mg/dL   Protein, ur NEGATIVE NEGATIVE mg/dL   Nitrite NEGATIVE NEGATIVE   Leukocytes,Ua NEGATIVE NEGATIVE   RBC / HPF 21-50 0 - 5 RBC/hpf   WBC, UA 0-5 0 - 5 WBC/hpf   Bacteria, UA NONE SEEN NONE SEEN   Mucus PRESENT     Comment: Performed at [facility], [place] [address], [place], [place] [address]   No results found for this or any previous visit (from the past 240 hour(s)). Creatinine: Recent Labs    01/04/19 0003  CREATININE 1.27*   Baseline Creatinine: 1  Impression/Assessment:  68yo with left proximal ureteral calculus  Plan:  We discussed the management including ureteral stent placement, ureteroscopy and ESWL and after discussing the options the patient elects for ESWL. Risks/benefits/alternatives  discussed  [name] 01/04/2019, 7:28 AM

## 2019-01-04 NOTE — ED Provider Notes (Signed)
TIME SEEN: 12:18 AM  CHIEF COMPLAINT: Left flank pain  HPI: Patient is a 68 year old male with history of kidney stones who presents to the emergency department left flank pain that worsened today.  Was seen in the emergency department at [facility] yesterday after he had sudden onset of left flank pain at 3 AM 2 days ago.  Was diagnosed with a 7 x 7 x 4 mm left proximal ureteral stone.  Also had a 5 mm stone within the right kidney.  States around 7:30 PM tonight his pain got worse and he took 2 Percocet and Naprosyn without much relief.  Has also had nausea and vomiting.  No fevers, diarrhea.  States the pain started to radiate around into his lower abdomen.  States he called his urologist at [facility] Dr. Tresa Norris who is a friend of the family who instructed patient should come to the emergency department to be evaluated by Dr. Alyson Norris with [facility].  ROS: See HPI Constitutional: no fever  Eyes: no drainage  ENT: no runny nose   Cardiovascular:  no chest pain  Resp: no SOB  GI:  vomiting GU: no dysuria Integumentary: no rash  Allergy: no hives  Musculoskeletal: no leg swelling  Neurological: no slurred speech ROS otherwise negative  PAST MEDICAL HISTORY/PAST SURGICAL HISTORY:  Past Medical History:  Diagnosis Date  . GERD (gastroesophageal reflux disease)   . Hyperlipemia   . Kidney stone   . Sleep apnea     MEDICATIONS:  Prior to Admission medications   Medication Sig Start Date End Date Taking? Authorizing Provider  ALPRAZolam Duanne Moron) 0.5 MG tablet Take 1 tablet by mouth 3 (three) times daily as needed for anxiety.  12/13/11   [provider]  atorvastatin (LIPITOR) 20 MG tablet Take 20 mg by mouth daily. 12/01/18   [provider]  ferrous sulfate 325 (65 FE) MG EC tablet Take 325 mg by mouth daily with breakfast.     [provider]  Multiple Vitamin (MULTIVITAMIN) tablet Take 1 tablet by mouth daily.    [provider]   naproxen (NAPROSYN) 500 MG tablet Take 1 tablet (500 mg total) by mouth 2 (two) times daily with a meal. 01/02/19   Noemi Chapel, MD  ondansetron (ZOFRAN ODT) 4 MG disintegrating tablet Take 1 tablet (4 mg total) by mouth every 8 (eight) hours as needed for nausea. 01/02/19   Noemi Chapel, MD  oxyCODONE-acetaminophen (PERCOCET) 5-325 MG tablet Take 1-2 tablets by mouth every 6 (six) hours as needed for up to 3 days. 01/02/19 01/05/19  Noemi Chapel, MD  pantoprazole (PROTONIX) 40 MG tablet Take 1 tablet (40 mg total) by mouth daily before breakfast. 01/20/18   Rehman, Mechele Dawley, MD  tamsulosin (FLOMAX) 0.4 MG CAPS capsule Take 1 capsule (0.4 mg total) by mouth daily for 5 days. 01/02/19 01/07/19  Noemi Chapel, MD    ALLERGIES:  PCN  SOCIAL HISTORY:  Social History   Tobacco Use  . Smoking status: Former Smoker    Packs/day: 1.50    Types: Cigarettes    Quit date: 08/27/2010    Years since quitting: 8.3  . Smokeless tobacco: Never Used  . Tobacco comment: quit 7 yrs ago  Substance Use Topics  . Alcohol use: Yes    Alcohol/week: 0.0 standard drinks    Comment: social rare    FAMILY HISTORY: Family History  Problem Relation Age of Onset  . Dementia Mother   . Breast cancer Mother   .  Coronary artery disease Mother   . Coronary artery disease Father   . Hypertension Father   . Diabetes type II Father   . Hyperlipidemia Father   . GER disease Father     EXAM: BP (!) 175/71 (BP Location: Left Arm)   Pulse 80   Temp 98 F (36.7 C) (Oral)   Resp 17   SpO2 97%  CONSTITUTIONAL: Alert and oriented and responds appropriately to questions. Well-appearing; well-nourished, obese, no distress, afebrile HEAD: Normocephalic EYES: Conjunctivae clear, pupils appear equal, EOM appear intact ENT: normal nose; moist mucous membranes NECK: Supple, normal ROM CARD: RRR; S1 and S2 appreciated; no murmurs, no clicks, no rubs, no gallops RESP: Normal chest excursion without splinting or  tachypnea; breath sounds clear and equal bilaterally; no wheezes, no rhonchi, no rales, no hypoxia or respiratory distress, speaking full sentences ABD/GI: Normal bowel sounds; non-distended; soft, non-tender, no rebound, no guarding, no peritoneal signs, no hepatosplenomegaly BACK:  The back appears normal EXT: Normal ROM in all joints; no deformity noted, no edema; no cyanosis SKIN: Normal color for age and race; warm; no rash on exposed skin NEURO: Moves all extremities equally PSYCH: The patient's mood and manner are appropriate.   MEDICAL DECISION MAKING: Patient here with complaints of worsening left-sided flank pain uncontrolled with his Percocet Naprosyn at home.  On my initial evaluation patient appears very comfortable and states his pain is now gone but he is concerned because it is intermittent and will come back suddenly and severely.  Will monitor closely and give fentanyl, Zofran in case pain begins to return.  Will repeat labs, urine today.  He states he feels he is not able to urinate normally.  We will BladderScan patient.  ED PROGRESS: 1:45 AM  Bladder scan reveals no urinary retention.  He has been able to urinate.  Urinalysis pending.  Labs show mild elevation of creatinine of 1.27.  He is receiving IV hydration.  Does have a leukocytosis of 14.7 which is likely reactive.  No fever.  Nurse reports pain is worse and he is now pacing the room.  Will give dose of morphine.  If unable to control patient's pain, will discuss with urology on-call.   4:20 AM  Pt's pain is been well controlled after morphine.  He states he would like urology to be consulted in the morning as he would like to have the stone removed.  He states he was told to come here to see Dr. Alyson Norris by his urologist at [facility] Dr. Tresa Norris.  Discussed with patient that I did not feel consulting him emergently in the middle of the night was appropriate but would be happy to give him a call in the morning.  5:30 AM   Spoke with Dr. Alyson Norris on-call for urology.  He recommends obtaining a KUB and he will follow up on these results to see if they can schedule patient for lithotripsy today.  We will keep patient n.p.o.  Patient updated with plan.  Still well-appearing with pain well controlled.   7:20 AM  Patient seen by Dr. Alyson Norris with urology and will take for lithotripsy this morning.  Will obtain rapid Covid test.  Appreciate urology assistance.  I reviewed all nursing notes and pertinent previous records as available.  I have interpreted any EKGs, lab and urine results, imaging (as available).   James Norris was evaluated in Emergency Department on 01/04/2019 for the symptoms described in the history of present illness. He was evaluated in  the context of the global COVID-19 pandemic, which necessitated consideration that the patient might be at risk for infection with the SARS-CoV-2 virus that causes COVID-19. Institutional protocols and algorithms that pertain to the evaluation of patients at risk for COVID-19 are in a state of rapid change based on information released by regulatory bodies including the CDC and federal and state organizations. These policies and algorithms were followed during the patient's care in the ED.  Patient was seen wearing N95, face shield, gloves.    Tehani Mersman, Delice [place], DO 01/04/19 [phone]

## 2019-01-04 NOTE — Discharge Instructions (Signed)
Lithotripsy, Care After This sheet gives you information about how to care for yourself after your procedure. Your health care provider may also give you more specific instructions. If you have problems or questions, contact your health care provider. What can I expect after the procedure? After the procedure, it is common to have:  Some blood in your urine. This should only last for a few days.  Soreness in your back, sides, or upper abdomen for a few days.  Blotches or bruises on your back where the pressure wave entered the skin.  Pain, discomfort, or nausea when pieces (fragments) of the kidney stone move through the tube that carries urine from the kidney to the bladder (ureter). Stone fragments may pass soon after the procedure, but they may continue to pass for up to 4-8 weeks. ? If you have severe pain or nausea, contact your health care provider. This may be caused by a large stone that was not broken up, and this may mean that you need more treatment.  Some pain or discomfort during urination.  Some pain or discomfort in the lower abdomen or (in men) at the base of the penis. Follow these instructions at home: Medicines  Take over-the-counter and prescription medicines only as told by your health care provider.  If you were prescribed an antibiotic medicine, take it as told by your health care provider. Do not stop taking the antibiotic even if you start to feel better.  Do not drive for 24 hours if you were given a medicine to help you relax (sedative).  Do not drive or use heavy machinery while taking prescription pain medicine. Eating and drinking      Drink enough water and fluids to keep your urine clear or pale yellow. This helps any remaining pieces of the stone to pass. It can also help prevent new stones from forming.  Eat plenty of fresh fruits and vegetables.  Follow instructions from your health care provider about eating and drinking restrictions. You may be  instructed: ? To reduce how much salt (sodium) you eat or drink. Check ingredients and nutrition facts on packaged foods and beverages. ? To reduce how much meat you eat.  Eat the recommended amount of calcium for your age and gender. Ask your health care provider how much calcium you should have. General instructions  Get plenty of rest.  Most people can resume normal activities 1-2 days after the procedure. Ask your health care provider what activities are safe for you.  Your health care provider may direct you to lie in a certain position (postural drainage) and tap firmly (percuss) over your kidney area to help stone fragments pass. Follow instructions as told by your health care provider.  If directed, strain all urine through the strainer that was provided by your health care provider. ? Keep all fragments for your health care provider to see. Any stones that are found may be sent to a medical lab for examination. The stone may be as small as a grain of salt.  Keep all follow-up visits as told by your health care provider. This is important. Contact a health care provider if:  You have pain that is severe or does not get better with medicine.  You have nausea that is severe or does not go away.  You have blood in your urine longer than your health care provider told you to expect.  You have more blood in your urine.  You have pain during urination that does   not go away.  You urinate more frequently than usual and this does not go away.  You develop a rash or any other possible signs of an allergic reaction. Get help right away if:  You have severe pain in your back, sides, or upper abdomen.  You have severe pain while urinating.  Your urine is very dark red.  You have blood in your stool (feces).  You cannot pass any urine at all.  You feel a strong urge to urinate after emptying your bladder.  You have a fever or chills.  You develop shortness of breath,  difficulty breathing, or chest pain.  You have severe nausea that leads to persistent vomiting.  You faint. Summary  After this procedure, it is common to have some pain, discomfort, or nausea when pieces (fragments) of the kidney stone move through the tube that carries urine from the kidney to the bladder (ureter). If this pain or nausea is severe, however, you should contact your health care provider.  Most people can resume normal activities 1-2 days after the procedure. Ask your health care provider what activities are safe for you.  Drink enough water and fluids to keep your urine clear or pale yellow. This helps any remaining pieces of the stone to pass, and it can help prevent new stones from forming.  If directed, strain your urine and keep all fragments for your health care provider to see. Fragments or stones may be as small as a grain of salt.  Get help right away if you have severe pain in your back, sides, or upper abdomen or have severe pain while urinating. This information is not intended to replace advice given to you by your health care provider. Make sure you discuss any questions you have with your health care provider. Document Released: 01/13/2007 Document Revised: 04/06/2018 Document Reviewed: 11/15/2015 Elsevier Patient Education  2020 [place]. Moderate Conscious Sedation, Adult, Care After These instructions provide you with information about caring for yourself after your procedure. Your health care provider may also give you more specific instructions. Your treatment has been planned according to current medical practices, but problems sometimes occur. Call your health care provider if you have any problems or questions after your procedure. What can I expect after the procedure? After your procedure, it is common:  To feel sleepy for several hours.  To feel clumsy and have poor balance for several hours.  To have poor judgment for several hours.  To  vomit if you eat too soon. Follow these instructions at home: For at least 24 hours after the procedure:   Do not: ? Participate in activities where you could fall or become injured. ? Drive. ? Use heavy machinery. ? Drink alcohol. ? Take sleeping pills or medicines that cause drowsiness. ? Make important decisions or sign legal documents. ? Take care of children on your own.  Rest. Eating and drinking  Follow the diet recommended by your health care provider.  If you vomit: ? Drink water, juice, or soup when you can drink without vomiting. ? Make sure you have little or no nausea before eating solid foods. General instructions  Have a responsible adult stay with you until you are awake and alert.  Take over-the-counter and prescription medicines only as told by your health care provider.  If you smoke, do not smoke without supervision.  Keep all follow-up visits as told by your health care provider. This is important. Contact a health care provider if:  You  keep feeling nauseous or you keep vomiting.  You feel light-headed.  You develop a rash.  You have a fever. Get help right away if:  You have trouble breathing. This information is not intended to replace advice given to you by your health care provider. Make sure you discuss any questions you have with your health care provider. Document Released: 10/14/2012 Document Revised: 12/06/2016 Document Reviewed: 04/15/2015 Elsevier Patient Education  2020 [place] may have urinary urgency (bladder spasms), pass small stone fragments,  and bloody urine on / off for up to 3 weeks. This is normal.   2 - Call MD or go to ER for fever >102, severe pain / nausea / vomiting not relieved by medications, or acute change in medical status

## 2019-01-04 NOTE — Consult Note (Signed)
Urology Consult  Referring physician: Dr. Leonides Schanz Reason for referral: left ureteral calculus  Chief Complaint: left flank pain  History of Present Illness: James Norris is a 68yo with a hx of nephrolithiasis who presented to the [facility] ER with worsening left flank pain. He was diagnosed with a 40mm calculus on CT on 12/26 at [facility].  Since 12/26 he has noted worsening left flank pain that is sharp, constant, severe and nonradiaitng. The pain was alleviated with narcotic pain mediation. No LUTS. NO other associated symptoms. No other exacerbating/ alleviating events. KUB shows a left ureteral stone at L4 outside the renal shadow. He took naproxen yesterday. His last stone event was 7-8 years ago treated with ESWL by the Dr. Tresa Endo [facility].   Past Medical History:  Diagnosis Date  . GERD (gastroesophageal reflux disease)   . Hyperlipemia   . Kidney stone   . Sleep apnea    Past Surgical History:  Procedure Laterality Date  . BALLOON DILATION N/A 03/11/2012   Procedure: BALLOON DILATION;  Surgeon: Rogene Houston, MD;  Location: [facility] ENDO SUITE;  Service: Endoscopy;  Laterality: N/A;  . COLONOSCOPY WITH ESOPHAGOGASTRODUODENOSCOPY (EGD) N/A 03/11/2012   Procedure: COLONOSCOPY WITH ESOPHAGOGASTRODUODENOSCOPY (EGD);  Surgeon: Rogene Houston, MD;  Location: [facility];  Service: Endoscopy;  Laterality: N/A;  1200  . ESOPHAGEAL DILATION N/A 02/15/2015   Procedure: ESOPHAGEAL DILATION;  Surgeon: Rogene Houston, MD;  Location: [facility];  Service: Endoscopy;  Laterality: N/A;  . ESOPHAGOGASTRODUODENOSCOPY N/A 02/15/2015   Procedure: ESOPHAGOGASTRODUODENOSCOPY (EGD);  Surgeon: Rogene Houston, MD;  Location: [facility];  Service: Endoscopy;  Laterality: N/A;  2:00  . EYE SURGERY     lasik- bilaterally  . TONSILLECTOMY      Medications: I have reviewed the patient's current medications. Allergies:  PCN  Family History  Problem Relation Age of Onset  . Dementia Mother   . Breast cancer Mother   .  Coronary artery disease Mother   . Coronary artery disease Father   . Hypertension Father   . Diabetes type II Father   . Hyperlipidemia Father   . GER disease Father    Social History:  reports that he quit smoking about 8 years ago. His smoking use included cigarettes. He smoked 1.50 packs per day. He has never used smokeless tobacco. He reports current alcohol use. He reports that he does not use drugs.  Review of Systems  Genitourinary: Positive for flank pain.  All other systems reviewed and are negative.   Physical Exam:  Vital signs in last 24 hours: Temp:  [98 F (36.7 C)] 98 F (36.7 C) (12/27 2352) Pulse Rate:  [70-90] 76 (12/28 0700) Resp:  [13-22] 14 (12/28 0700) BP: (142-205)/(71-106) 157/95 (12/28 0700) SpO2:  [91 %-99 %] 96 % (12/28 0700) Physical Exam  Constitutional: He is oriented to person, place, and time. He appears well-developed and well-nourished.  HENT:  Head: Normocephalic and atraumatic.  Eyes: Pupils are equal, round, and reactive to light. EOM are normal.  Neck: No thyromegaly present.  Cardiovascular: Normal rate and regular rhythm.  Respiratory: Effort normal. No respiratory distress.  GI: Soft. He exhibits no distension.  Musculoskeletal:        General: No edema. Normal range of motion.     Cervical back: Normal range of motion.  Neurological: He is alert and oriented to person, place, and time.  Skin: Skin is warm and dry.  Psychiatric: He has a normal mood and affect.  His behavior is normal. Judgment and thought content normal.    Laboratory Data:  Results for orders placed or performed during the hospital encounter of 01/04/19 (from the past 72 hour(s))  Basic metabolic panel     Status: Abnormal   Collection Time: 01/04/19 12:03 AM  Result Value Ref Range   Sodium 140 135 - 145 mmol/L   Potassium 4.5 3.5 - 5.1 mmol/L   Chloride 106 98 - 111 mmol/L   CO2 27 22 - 32 mmol/L   Glucose, Bld 158 (H) 70 - 99 mg/dL   BUN 16 8 - 23 mg/dL    Creatinine, Ser 1.27 (H) 0.61 - 1.24 mg/dL   Calcium 9.4 8.9 - 10.3 mg/dL   GFR calc non Af Amer 57 (L) >60 mL/min   GFR calc Af Amer >60 >60 mL/min   Anion gap 7 5 - 15    Comment: Performed at [facility], [place] [address] [place], [place] [address]  CBC     Status: Abnormal   Collection Time: 01/04/19 12:03 AM  Result Value Ref Range   WBC 14.7 (H) 4.0 - 10.5 K/uL   RBC 5.49 4.22 - 5.81 MIL/uL   Hemoglobin 16.0 13.0 - 17.0 g/dL   HCT 49.0 39.0 - 52.0 %   MCV 89.3 80.0 - 100.0 fL   MCH 29.1 26.0 - 34.0 pg   MCHC 32.7 30.0 - 36.0 g/dL   RDW 12.2 11.5 - 15.5 %   Platelets 277 150 - 400 K/uL   nRBC 0.0 0.0 - 0.2 %    Comment: Performed at [facility], [place] [address] [place], [place] [address]  Urinalysis, Routine w reflex microscopic- may I&O cath if menses     Status: Abnormal   Collection Time: 01/04/19  1:08 AM  Result Value Ref Range   Color, Urine YELLOW YELLOW   APPearance CLEAR CLEAR   Specific Gravity, Urine 1.020 1.005 - 1.030   pH 5.0 5.0 - 8.0   Glucose, UA NEGATIVE NEGATIVE mg/dL   Hgb urine dipstick MODERATE (A) NEGATIVE   Bilirubin Urine NEGATIVE NEGATIVE   Ketones, ur NEGATIVE NEGATIVE mg/dL   Protein, ur NEGATIVE NEGATIVE mg/dL   Nitrite NEGATIVE NEGATIVE   Leukocytes,Ua NEGATIVE NEGATIVE   RBC / HPF 21-50 0 - 5 RBC/hpf   WBC, UA 0-5 0 - 5 WBC/hpf   Bacteria, UA NONE SEEN NONE SEEN   Mucus PRESENT     Comment: Performed at [facility], [place] [address] [place], [place] [address]   No results found for this or any previous visit (from the past 240 hour(s)). Creatinine: Recent Labs    01/04/19 0003  CREATININE 1.27*   Baseline Creatinine: 1  Impression/Assessment:  68yo with left proximal ureteral calculus  Plan:  We discussed the management including ureteral stent placement, ureteroscopy and ESWL and after discussing the options the patient elects for ESWL. Risks/benefits/alternatives  discussed  [name] 01/04/2019, 7:28 AM

## 2019-01-05 ENCOUNTER — Emergency Department (HOSPITAL_COMMUNITY)
Admission: EM | Admit: 2019-01-05 | Discharge: 2019-01-05 | Disposition: A | Discharge status: Home / Self Care | Payer: PPO | Attending: Emergency Medicine | Admitting: Emergency Medicine

## 2019-01-05 ENCOUNTER — Encounter: Payer: Self-pay | Admitting: *Deleted

## 2019-01-05 ENCOUNTER — Other Ambulatory Visit: Payer: Self-pay

## 2019-01-05 DIAGNOSIS — N1339 Other hydronephrosis: Secondary | ICD-10-CM | POA: Insufficient documentation

## 2019-01-05 DIAGNOSIS — Z87891 Personal history of nicotine dependence: Secondary | ICD-10-CM | POA: Insufficient documentation

## 2019-01-05 DIAGNOSIS — N200 Calculus of kidney: Secondary | ICD-10-CM | POA: Diagnosis not present

## 2019-01-05 DIAGNOSIS — R339 Retention of urine, unspecified: Secondary | ICD-10-CM | POA: Diagnosis present

## 2019-01-05 LAB — URINALYSIS, ROUTINE W REFLEX MICROSCOPIC
Bilirubin Urine: NEGATIVE
Glucose, UA: NEGATIVE mg/dL
Ketones, ur: 20 mg/dL — AB
Nitrite: NEGATIVE
Protein, ur: 30 mg/dL — AB
RBC / HPF: >50 RBC/hpf — ABNORMAL HIGH (ref 0–5)
Specific Gravity, Urine: 1.017 (ref 1.005–1.030)
pH: 5 (ref 5.0–8.0)

## 2019-01-05 LAB — BASIC METABOLIC PANEL
Anion gap: 9 (ref 5–15)
BUN: 20 mg/dL (ref 8–23)
CO2: 25 mmol/L (ref 22–32)
Calcium: 8.7 mg/dL — ABNORMAL LOW (ref 8.9–10.3)
Chloride: 101 mmol/L (ref 98–111)
Creatinine, Ser: 1.53 mg/dL — ABNORMAL HIGH (ref 0.61–1.24)
GFR calc Af Amer: 53 mL/min — ABNORMAL LOW (ref >60)
GFR calc non Af Amer: 46 mL/min — ABNORMAL LOW (ref >60)
Glucose, Bld: 129 mg/dL — ABNORMAL HIGH (ref 70–99)
Potassium: 4.1 mmol/L (ref 3.5–5.1)
Sodium: 135 mmol/L (ref 135–145)

## 2019-01-05 MED ORDER — OXYCODONE HCL 5 MG PO TABS
5.0000 mg | ORAL_TABLET | Freq: Once | ORAL | Status: AC
  Administered 2019-01-05: 5 mg via ORAL
  Filled 2019-01-05: qty 1

## 2019-01-05 NOTE — ED Provider Notes (Signed)
[facility] Emergency Department Provider Note MRN:  [id number]  Arrival date & time: 01/05/19     Chief Complaint   Nephrolithiasis and Urinary Retention   History of Present Illness   James Norris is a 68 y.o. year-old male with a history of kidney stones presenting to the ED with chief complaint of urinary retention.  Patient explains that he had a lithotripsy procedure yesterday.  He drank a lot of water earlier today as instructed.  Despite drinking a lot of water, he was only able to urinate 3 ounces.  He began experiencing pain and pressure to his left flank.  Denies fever, no chest pain or shortness of breath, no vomiting, no other complaints.  Pain was severe but has improved with time.  Currently mild to moderate.  Review of Systems  A complete 10 system review of systems was obtained and all systems are negative except as noted in the HPI and PMH.   Patient's Health History    Past Medical History:  Diagnosis Date  . GERD (gastroesophageal reflux disease)   . Hyperlipemia   . Kidney stone   . Sleep apnea     Past Surgical History:  Procedure Laterality Date  . BALLOON DILATION N/A 03/11/2012   Procedure: BALLOON DILATION;  Surgeon: Rogene Houston, MD;  Location: [facility];  Service: Endoscopy;  Laterality: N/A;  . COLONOSCOPY WITH ESOPHAGOGASTRODUODENOSCOPY (EGD) N/A 03/11/2012   Procedure: COLONOSCOPY WITH ESOPHAGOGASTRODUODENOSCOPY (EGD);  Surgeon: Rogene Houston, MD;  Location: [facility];  Service: Endoscopy;  Laterality: N/A;  1200  . ESOPHAGEAL DILATION N/A 02/15/2015   Procedure: ESOPHAGEAL DILATION;  Surgeon: Rogene Houston, MD;  Location: [facility];  Service: Endoscopy;  Laterality: N/A;  . ESOPHAGOGASTRODUODENOSCOPY N/A 02/15/2015   Procedure: ESOPHAGOGASTRODUODENOSCOPY (EGD);  Surgeon: Rogene Houston, MD;  Location: [facility];  Service: Endoscopy;  Laterality: N/A;  2:00  . EXTRACORPOREAL SHOCK WAVE LITHOTRIPSY Left  01/04/2019   Procedure: EXTRACORPOREAL SHOCK WAVE LITHOTRIPSY (ESWL);  Surgeon: Alexis Frock, MD;  Location: [facility] ORS;  Service: Urology;  Laterality: Left;  . EYE SURGERY     lasik- bilaterally  . TONSILLECTOMY      Family History  Problem Relation Age of Onset  . Dementia Mother   . Breast cancer Mother   . Coronary artery disease Mother   . Coronary artery disease Father   . Hypertension Father   . Diabetes type II Father   . Hyperlipidemia Father   . GER disease Father     Social History   Socioeconomic History  . Marital status: Married    Spouse name: Not on file  . Number of children: Not on file  . Years of education: Not on file  . Highest education level: Not on file  Occupational History  . Not on file  Tobacco Use  . Smoking status: Former Smoker    Packs/day: 1.50    Types: Cigarettes    Quit date: 08/27/2010    Years since quitting: 8.3  . Smokeless tobacco: Never Used  . Tobacco comment: quit 7 yrs ago  Substance and Sexual Activity  . Alcohol use: Yes    Alcohol/week: 0.0 standard drinks    Comment: social rare  . Drug use: No  . Sexual activity: Yes  Other Topics Concern  . Not on file  Social History Narrative  . Not on file   Social Determinants of Health   Financial Resource Strain:   .  Difficulty of Paying Living Expenses: Not on file  Food Insecurity:   . Worried About Charity fundraiser in the Last Year: Not on file  . Ran Out of Food in the Last Year: Not on file  Transportation Needs:   . Lack of Transportation (Medical): Not on file  . Lack of Transportation (Non-Medical): Not on file  Physical Activity:   . Days of Exercise per Week: Not on file  . Minutes of Exercise per Session: Not on file  Stress:   . Feeling of Stress : Not on file  Social Connections:   . Frequency of Communication with Friends and Family: Not on file  . Frequency of Social Gatherings with Friends and Family: Not on file  . Attends Religious Services:  Not on file  . Active Member of Clubs or Organizations: Not on file  . Attends Archivist Meetings: Not on file  . Marital Status: Not on file  Intimate Partner Violence:   . Fear of Current or Ex-Partner: Not on file  . Emotionally Abused: Not on file  . Physically Abused: Not on file  . Sexually Abused: Not on file     Physical Exam  Vital Signs and Nursing Notes reviewed Vitals:   01/05/19 2128 01/05/19 2200  BP: 107/86 135/82  Pulse: 88 84  Resp: 14 20  Temp:    SpO2: 95% 92%    CONSTITUTIONAL: Well-appearing, NAD NEURO:  Alert and oriented x 3, no focal deficits EYES:  eyes equal and reactive ENT/NECK:  no LAD, no JVD CARDIO: Regular rate, well-perfused, normal S1 and S2 PULM:  CTAB no wheezing or rhonchi GI/GU:  normal bowel sounds, non-distended, non-tender MSK/SPINE:  No gross deformities, no edema SKIN:  no rash, atraumatic PSYCH:  Appropriate speech and behavior  Diagnostic and Interventional Summary    EKG Interpretation  Date/Time:    Ventricular Rate:    PR Interval:    QRS Duration:   QT Interval:    QTC Calculation:   R Axis:     Text Interpretation:        Labs Reviewed  BASIC METABOLIC PANEL - Abnormal; Notable for the following components:      Result Value   Glucose, Bld 129 (*)    Creatinine, Ser 1.53 (*)    Calcium 8.7 (*)    GFR calc non Af Amer 46 (*)    GFR calc Af Amer 53 (*)    All other components within normal limits  URINALYSIS, ROUTINE W REFLEX MICROSCOPIC - Abnormal; Notable for the following components:   APPearance CLOUDY (*)    Hgb urine dipstick LARGE (*)    Ketones, ur 20 (*)    Protein, ur 30 (*)    Leukocytes,Ua SMALL (*)    RBC / HPF >50 (*)    Bacteria, UA RARE (*)    All other components within normal limits    No orders to display    Medications  oxyCODONE (Oxy IR/ROXICODONE) immediate release tablet 5 mg (5 mg Oral Given 01/05/19 2124)     Procedures  /  Critical Care Ultrasound ED  Renal  Date/Time: 01/05/2019 10:43 PM Performed by: Maudie Flakes, MD Authorized by: Maudie Flakes, MD   Procedure details:    Indications: hydronephrosis     Technique:  L kidney and R kidneyImages: archivedStudy Limitations: body habitus Left kidney findings:    Hydronephrosis: mild   Right kidney findings:    Hydronephrosis: none  ED Course and Medical Decision Making  I have reviewed the triage vital signs and the nursing notes.  Pertinent labs & imaging results that were available during my care of the patient were reviewed by me and considered in my medical decision making (see below for details).     Suspect kidney stone in this 68 year old male with documented kidney stones, lithotripsy yesterday, possibly has had distention of a fragment into the ureter causing his left flank pain.  Well-appearing, normal vital signs, will evaluate for hydronephrosis with ultrasound.  Mild hydronephrosis on the left, labs reveal very mild AKI.  Urinalysis with few white blood cells, large blood, doubt infection.  Pain very well controlled.  Discussed with Dr. Bess Harvest of urology, patient appropriate for discharge with close follow-up.    Barth Kirks. Sedonia Small, [place] [email or web address]  Final Clinical Impressions(s) / ED Diagnoses     ICD-10-CM   1. Kidney stone  N20.0     ED Discharge Orders    None       Discharge Instructions Discussed with and Provided to Patient:     Discharge Instructions     You were evaluated in the Emergency Department and after careful evaluation, we did not find any emergent condition requiring admission or further testing in the hospital.  Your exam/testing today is overall reassuring.  You should be contacted soon by your urologist for follow-up.  Please return to the Emergency Department if you experience any worsening of your condition.  We encourage you to follow up with a primary care  provider.  Thank you for allowing [place] to be a part of your care.       Maudie Flakes, MD 01/05/19 2245

## 2019-01-05 NOTE — Discharge Instructions (Addendum)
You were evaluated in the Emergency Department and after careful evaluation, we did not find any emergent condition requiring admission or further testing in the hospital.  Your exam/testing today is overall reassuring.  You should be contacted soon by your urologist for follow-up.  Please return to the Emergency Department if you experience any worsening of your condition.  We encourage you to follow up with a primary care provider.  Thank you for allowing [place] to be a part of your care.

## 2019-01-05 NOTE — ED Triage Notes (Addendum)
Pt coming from home c/o urinary retention. Has drank 2 quarts of water and only approx 3 oz of output since noon. Seen 12/28 for bilateral kidney stones. Spoke with [facility] and told to come in now

## 2019-01-05 NOTE — ED Notes (Signed)
Patient was verbalized discharge instructions. Pt had no further questions at this time. NAD. 

## 2019-01-05 NOTE — ED Notes (Signed)
Full rainbow sent to lab.  

## 2019-01-05 NOTE — ED Notes (Signed)
Bladder scan showed about 150 mL

## 2019-01-05 NOTE — ED Notes (Addendum)
Bladder Scan 150ml 

## 2019-01-06 ENCOUNTER — Ambulatory Visit (HOSPITAL_COMMUNITY): Payer: PPO

## 2019-01-06 ENCOUNTER — Ambulatory Visit (HOSPITAL_COMMUNITY): Payer: PPO | Admitting: Anesthesiology

## 2019-01-06 ENCOUNTER — Encounter (HOSPITAL_COMMUNITY): Payer: Self-pay | Admitting: Urology

## 2019-01-06 ENCOUNTER — Other Ambulatory Visit: Payer: Self-pay

## 2019-01-06 ENCOUNTER — Encounter (HOSPITAL_COMMUNITY)
Admission: RE | Disposition: A | Discharge status: Home / Self Care | Payer: Self-pay | Source: Ambulatory Visit | Attending: Urology

## 2019-01-06 ENCOUNTER — Other Ambulatory Visit: Payer: Self-pay | Admitting: Urology

## 2019-01-06 ENCOUNTER — Encounter: Payer: Self-pay | Admitting: Urology

## 2019-01-06 ENCOUNTER — Ambulatory Visit (INDEPENDENT_AMBULATORY_CARE_PROVIDER_SITE_OTHER): Payer: PPO | Admitting: Urology

## 2019-01-06 ENCOUNTER — Ambulatory Visit (HOSPITAL_COMMUNITY)
Admission: RE | Admit: 2019-01-06 | Discharge: 2019-01-06 | Disposition: A | Discharge status: Home / Self Care | Payer: PPO | Source: Ambulatory Visit | Attending: Urology | Admitting: Urology

## 2019-01-06 VITALS — BP 171/86 | HR 88 | Temp 98.0°F | Ht 71.0 in | Wt 260.0 lb

## 2019-01-06 DIAGNOSIS — N401 Enlarged prostate with lower urinary tract symptoms: Secondary | ICD-10-CM | POA: Diagnosis not present

## 2019-01-06 DIAGNOSIS — K219 Gastro-esophageal reflux disease without esophagitis: Secondary | ICD-10-CM | POA: Insufficient documentation

## 2019-01-06 DIAGNOSIS — Z87442 Personal history of urinary calculi: Secondary | ICD-10-CM | POA: Diagnosis not present

## 2019-01-06 DIAGNOSIS — Z8249 Family history of ischemic heart disease and other diseases of the circulatory system: Secondary | ICD-10-CM | POA: Insufficient documentation

## 2019-01-06 DIAGNOSIS — N200 Calculus of kidney: Secondary | ICD-10-CM | POA: Diagnosis not present

## 2019-01-06 DIAGNOSIS — Z87891 Personal history of nicotine dependence: Secondary | ICD-10-CM | POA: Diagnosis not present

## 2019-01-06 DIAGNOSIS — Z8349 Family history of other endocrine, nutritional and metabolic diseases: Secondary | ICD-10-CM | POA: Diagnosis not present

## 2019-01-06 DIAGNOSIS — I4510 Unspecified right bundle-branch block: Secondary | ICD-10-CM | POA: Diagnosis not present

## 2019-01-06 DIAGNOSIS — E785 Hyperlipidemia, unspecified: Secondary | ICD-10-CM | POA: Diagnosis not present

## 2019-01-06 DIAGNOSIS — N201 Calculus of ureter: Secondary | ICD-10-CM

## 2019-01-06 DIAGNOSIS — G4730 Sleep apnea, unspecified: Secondary | ICD-10-CM | POA: Diagnosis not present

## 2019-01-06 HISTORY — DX: Calculus of kidney: N20.0

## 2019-01-06 HISTORY — PX: CYSTOSCOPY/URETEROSCOPY/HOLMIUM LASER/STENT PLACEMENT: SHX6546

## 2019-01-06 HISTORY — DX: Anxiety disorder, unspecified: F41.9

## 2019-01-06 LAB — POCT URINALYSIS DIPSTICK
Bilirubin, UA: NEGATIVE
Glucose, UA: NEGATIVE
Nitrite, UA: NEGATIVE
Protein, UA: POSITIVE — AB
Spec Grav, UA: >=1.03 — AB (ref 1.010–1.025)
Urobilinogen, UA: NEGATIVE E.U./dL — AB
pH, UA: 6 (ref 5.0–8.0)

## 2019-01-06 LAB — BLADDER SCAN AMB NON-IMAGING: Scan Result: 25.8

## 2019-01-06 SURGERY — CYSTOSCOPY/URETEROSCOPY/HOLMIUM LASER/STENT PLACEMENT
Anesthesia: General | Laterality: Left

## 2019-01-06 MED ORDER — ROCURONIUM BROMIDE 100 MG/10ML IV SOLN
INTRAVENOUS | Status: DC | PRN
  Administered 2019-01-06: 20 mg via INTRAVENOUS

## 2019-01-06 MED ORDER — PHENYLEPHRINE 40 MCG/ML (10ML) SYRINGE FOR IV PUSH (FOR BLOOD PRESSURE SUPPORT)
PREFILLED_SYRINGE | INTRAVENOUS | Status: AC
  Filled 2019-01-06: qty 10

## 2019-01-06 MED ORDER — SODIUM CHLORIDE 0.9 % IR SOLN
Status: DC | PRN
  Administered 2019-01-06 (×2): 3000 mL

## 2019-01-06 MED ORDER — FENTANYL CITRATE (PF) 100 MCG/2ML IJ SOLN
INTRAMUSCULAR | Status: DC | PRN
  Administered 2019-01-06 (×2): 50 ug via INTRAVENOUS

## 2019-01-06 MED ORDER — LACTATED RINGERS IV SOLN: Freq: Once | INTRAVENOUS | Status: AC

## 2019-01-06 MED ORDER — ONDANSETRON HCL 4 MG/2ML IJ SOLN
INTRAMUSCULAR | Status: DC | PRN
  Administered 2019-01-06: 4 mg via INTRAVENOUS

## 2019-01-06 MED ORDER — LACTATED RINGERS IV SOLN: INTRAVENOUS | Status: DC | PRN

## 2019-01-06 MED ORDER — PHENYLEPHRINE HCL (PRESSORS) 10 MG/ML IV SOLN
INTRAVENOUS | Status: DC | PRN
  Administered 2019-01-06 (×2): 80 ug via INTRAVENOUS

## 2019-01-06 MED ORDER — PROPOFOL 10 MG/ML IV BOLUS
INTRAVENOUS | Status: AC
  Filled 2019-01-06: qty 40

## 2019-01-06 MED ORDER — HYDROMORPHONE HCL 1 MG/ML IJ SOLN: 0.2500 mg | INTRAMUSCULAR | Status: DC | PRN

## 2019-01-06 MED ORDER — FENTANYL CITRATE (PF) 100 MCG/2ML IJ SOLN
INTRAMUSCULAR | Status: AC
  Filled 2019-01-06: qty 2

## 2019-01-06 MED ORDER — DIATRIZOATE MEGLUMINE 30 % UR SOLN
URETHRAL | Status: DC | PRN
  Administered 2019-01-06: 15 mL via URETHRAL

## 2019-01-06 MED ORDER — ONDANSETRON HCL 4 MG/2ML IJ SOLN: 4.0000 mg | Freq: Once | INTRAMUSCULAR | Status: DC | PRN

## 2019-01-06 MED ORDER — KETOROLAC TROMETHAMINE 60 MG/2ML IM SOLN: 60.0000 mg | Freq: Once | INTRAMUSCULAR | Status: DC

## 2019-01-06 MED ORDER — SUGAMMADEX SODIUM 200 MG/2ML IV SOLN
INTRAVENOUS | Status: DC | PRN
  Administered 2019-01-06: 235.8 mg via INTRAVENOUS

## 2019-01-06 MED ORDER — DIATRIZOATE MEGLUMINE 30 % UR SOLN
URETHRAL | Status: AC
  Filled 2019-01-06: qty 100

## 2019-01-06 MED ORDER — KETOROLAC TROMETHAMINE 30 MG/ML IJ SOLN
30.0000 mg | Freq: Once | INTRAMUSCULAR | Status: AC
  Administered 2019-01-06: 10:00:00 30 mg via INTRAMUSCULAR

## 2019-01-06 MED ORDER — GLYCOPYRROLATE PF 0.2 MG/ML IJ SOSY
PREFILLED_SYRINGE | INTRAMUSCULAR | Status: DC | PRN
  Administered 2019-01-06: .2 mg via INTRAVENOUS

## 2019-01-06 MED ORDER — KETOROLAC TROMETHAMINE 30 MG/ML IJ SOLN
30.0000 mg | Freq: Once | INTRAMUSCULAR | Status: AC
  Administered 2019-01-06: 30 mg via INTRAMUSCULAR

## 2019-01-06 MED ORDER — GENTAMICIN SULFATE 40 MG/ML IJ SOLN
5.0000 mg/kg | INTRAVENOUS | Status: AC
  Administered 2019-01-06: 461.5 mg via INTRAVENOUS
  Filled 2019-01-06: qty 11.5

## 2019-01-06 MED ORDER — SUCCINYLCHOLINE CHLORIDE 20 MG/ML IJ SOLN
INTRAMUSCULAR | Status: DC | PRN
  Administered 2019-01-06: 140 mg via INTRAVENOUS

## 2019-01-06 MED ORDER — OXYCODONE-ACETAMINOPHEN 10-325 MG PO TABS: 1.0000 | ORAL_TABLET | ORAL | 0 refills | Status: DC | PRN

## 2019-01-06 MED ORDER — PROPOFOL 10 MG/ML IV BOLUS
INTRAVENOUS | Status: DC | PRN
  Administered 2019-01-06: 230 mg via INTRAVENOUS

## 2019-01-06 MED ORDER — ONDANSETRON HCL 4 MG/2ML IJ SOLN
INTRAMUSCULAR | Status: AC
  Filled 2019-01-06: qty 2

## 2019-01-06 MED ORDER — TAMSULOSIN HCL 0.4 MG PO CAPS: 0.4000 mg | ORAL_CAPSULE | Freq: Every day | ORAL | 0 refills | Status: AC

## 2019-01-06 MED ORDER — ARTIFICIAL TEARS OPHTHALMIC OINT
TOPICAL_OINTMENT | OPHTHALMIC | Status: AC
  Filled 2019-01-06: qty 7

## 2019-01-06 SURGICAL SUPPLY — 28 items
BAG DRAIN URO TABLE W/ADPT NS (BAG) ×2 IMPLANT
BAG DRN 8 ADPR NS SKTRN CSTL (BAG) ×1
BASKET LASER NITINOL 1.9FR (BASKET) IMPLANT
BASKET ZERO TIP NITINOL 2.4FR (BASKET) IMPLANT
BSKT STON RTRVL 120 1.9FR (BASKET)
BSKT STON RTRVL ZERO TP 2.4FR (BASKET)
CATH INTERMIT  6FR 70CM (CATHETERS) IMPLANT
CLOTH BEACON ORANGE TIMEOUT ST (SAFETY) ×2 IMPLANT
EXTRACTOR STONE NITINOL NGAGE (UROLOGICAL SUPPLIES) ×2 IMPLANT
FIBER LASER FLEXIVA 200 (UROLOGICAL SUPPLIES) ×2 IMPLANT
GLOVE BIO SURGEON STRL SZ8 (GLOVE) ×2 IMPLANT
GLOVE BIOGEL PI IND STRL 7.0 (GLOVE) ×2 IMPLANT
GLOVE BIOGEL PI INDICATOR 7.0 (GLOVE) ×2
GLOVE ECLIPSE 6.5 STRL STRAW (GLOVE) ×1 IMPLANT
GOWN STRL REUS W/ TWL LRG LVL3 (GOWN DISPOSABLE) ×1 IMPLANT
GOWN STRL REUS W/ TWL XL LVL3 (GOWN DISPOSABLE) ×1 IMPLANT
GOWN STRL REUS W/TWL LRG LVL3 (GOWN DISPOSABLE) ×2
GOWN STRL REUS W/TWL XL LVL3 (GOWN DISPOSABLE) ×2
GUIDEWIRE ANG ZIPWIRE 038X150 (WIRE) ×2 IMPLANT
GUIDEWIRE STR DUAL SENSOR (WIRE) IMPLANT
IV NS IRRIG 3000ML ARTHROMATIC (IV SOLUTION) ×4 IMPLANT
LASER FIBER DISP (UROLOGICAL SUPPLIES) IMPLANT
MANIFOLD NEPTUNE II (INSTRUMENTS) ×2 IMPLANT
PACK CYSTO (CUSTOM PROCEDURE TRAY) ×2 IMPLANT
PAD ARMBOARD 7.5X6 YLW CONV (MISCELLANEOUS) ×2 IMPLANT
SHEATH URETERAL 12FRX35CM (MISCELLANEOUS) ×1 IMPLANT
STENT URET 6FRX26 CONTOUR (STENTS) ×1 IMPLANT
SYR 10ML LL (SYRINGE) ×2 IMPLANT

## 2019-01-06 NOTE — Patient Instructions (Signed)

## 2019-01-06 NOTE — Interval H&P Note (Signed)
History and Physical Interval Note:  01/06/2019 12:52 PM  James Norris  has presented today for surgery, with the diagnosis of left ureteral stone.  The various methods of treatment have been discussed with the patient and family. After consideration of risks, benefits and other options for treatment, the patient has consented to  Procedure(s): CYSTOSCOPY/URETEROSCOPY/HOLMIUM LASER/STENT PLACEMENT WITH RETROGRADE (Left) as a surgical intervention.  The patient's history has been reviewed, patient examined, no change in status, stable for surgery.  I have reviewed the patient's chart and labs.  Questions were answered to the patient's satisfaction.     [name]

## 2019-01-06 NOTE — Anesthesia Preprocedure Evaluation (Signed)
Anesthesia Evaluation  Patient identified by MRN, date of birth, ID band Patient awake    Reviewed: Allergy & Precautions, NPO status , Patient's Chart, lab work & pertinent test results  Airway Mallampati: II  TM Distance: >3 FB Neck ROM: Full    Dental no notable dental hx.    Pulmonary sleep apnea and Continuous Positive Airway Pressure Ventilation , former smoker,    Pulmonary exam normal breath sounds clear to auscultation       Cardiovascular Exercise Tolerance: Good  Rhythm:Regular Rate:Normal  - Left ventricle: The cavity size was normal. Wall thickness was   increased in a pattern of mild LVH. Systolic function was normal.   The estimated ejection fraction was in the range of 60% to 65%.   Wall motion was normal; there were no regional wall motion   abnormalities. Left ventricular diastolic function parameters   were normal for the patient&'s age. - Aortic valve: Mildly calcified annulus. Probably trileaflet;   mildly calcified leaflets. - Mitral valve: Mildly calcified annulus. There was trivial   regurgitation. - Left atrium: The atrium was at the upper limits of normal in   size. - Right atrium: Central venous pressure (est): 3 mm Hg. - Atrial septum: No defect or patent foramen ovale was identified. - Tricuspid valve: There was trivial regurgitation. - Pulmonary arteries: PA peak pressure: 17 mm Hg (S). - Pericardium, extracardiac: There was no pericardial effusion. EKG - incomplete RBBB   Neuro/Psych  Neuromuscular disease negative psych ROS   GI/Hepatic Neg liver ROS, GERD  Medicated and Controlled,  Endo/Other  negative endocrine ROS  Renal/GU Renal InsufficiencyRenal disease   Renal stones    Musculoskeletal negative musculoskeletal ROS (+)   Abdominal   Peds  Hematology  (+) anemia ,   Anesthesia Other Findings   Reproductive/Obstetrics negative OB ROS                            Anesthesia Physical Anesthesia Plan  ASA: III  Anesthesia Plan: General   Post-op Pain Management:    Induction: Intravenous  PONV Risk Score and Plan: 4 or greater and Dexamethasone, Ondansetron and Midazolam  Airway Management Planned: Oral ETT  Additional Equipment:   Intra-op Plan:   Post-operative Plan:   Informed Consent: I have reviewed the patients History and Physical, chart, labs and discussed the procedure including the risks, benefits and alternatives for the proposed anesthesia with the patient or authorized representative who has indicated his/her understanding and acceptance.     Dental advisory given  Plan Discussed with: CRNA  Anesthesia Plan Comments:         Anesthesia Quick Evaluation

## 2019-01-06 NOTE — Transfer of Care (Signed)
Immediate Anesthesia Transfer of Care Note  Patient: James Norris  Procedure(s) Performed: CYSTOSCOPY/URETEROSCOPY//STENT PLACEMENT WITH LEFT RETROGRADE PYELOGRAM (Left )  Patient Location: PACU  Anesthesia Type:General  Level of Consciousness: awake, alert , oriented and patient cooperative  Airway & Oxygen Therapy: Patient Spontanous Breathing  Post-op Assessment: Report given to RN and Post -op Vital signs reviewed and stable  Post vital signs: Reviewed  Last Vitals:  Vitals Value Taken Time  BP 114/68 01/06/19 1615  Temp 36.7 C 01/06/19 1609  Pulse 79 01/06/19 1616  Resp 15 01/06/19 1616  SpO2 93 % 01/06/19 1616  Vitals shown include unvalidated device data.  Last Pain:  Vitals:   01/06/19 1609  PainSc: Asleep         Complications: No apparent anesthesia complications

## 2019-01-06 NOTE — Progress Notes (Signed)
01/06/2019 8:59 AM   James Norris [birth year] [id number]  Referring provider: Redmond School, MD [address] [place],  [place] [address]  Nephrolithiasis   HPI: James Norris is a 68yo with a hx of nephrolithiasis who presented to the ER on 12/28 with left flank pain and was diagnosed with a 24mm left proximal calculus. He underwent ESWL on 12/28. He presents today with worsening left flank pain. He has severe nausea and vomiting. He is unable to eat or drink. He is having urgency, frequency, and dysuria. He is having gross hematuria.    PMH: Past Medical History:  Diagnosis Date  . GERD (gastroesophageal reflux disease)   . Hyperlipemia   . Kidney stone   . Sleep apnea     Surgical History: Past Surgical History:  Procedure Laterality Date  . BALLOON DILATION N/A 03/11/2012   Procedure: BALLOON DILATION;  Surgeon: Rogene Houston, MD;  Location: [facility];  Service: Endoscopy;  Laterality: N/A;  . COLONOSCOPY WITH ESOPHAGOGASTRODUODENOSCOPY (EGD) N/A 03/11/2012   Procedure: COLONOSCOPY WITH ESOPHAGOGASTRODUODENOSCOPY (EGD);  Surgeon: Rogene Houston, MD;  Location: [facility];  Service: Endoscopy;  Laterality: N/A;  1200  . ESOPHAGEAL DILATION N/A 02/15/2015   Procedure: ESOPHAGEAL DILATION;  Surgeon: Rogene Houston, MD;  Location: [facility];  Service: Endoscopy;  Laterality: N/A;  . ESOPHAGOGASTRODUODENOSCOPY N/A 02/15/2015   Procedure: ESOPHAGOGASTRODUODENOSCOPY (EGD);  Surgeon: Rogene Houston, MD;  Location: [facility];  Service: Endoscopy;  Laterality: N/A;  2:00  . EXTRACORPOREAL SHOCK WAVE LITHOTRIPSY Left 01/04/2019   Procedure: EXTRACORPOREAL SHOCK WAVE LITHOTRIPSY (ESWL);  Surgeon: Alexis Frock, MD;  Location: [facility];  Service: Urology;  Laterality: Left;  . EYE SURGERY     lasik- bilaterally  . TONSILLECTOMY      Home Medications:  See epic list  Allergies:  pcn  Family History: Family History  Problem Relation Age of Onset  .  Dementia Mother   . Breast cancer Mother   . Coronary artery disease Mother   . Coronary artery disease Father   . Hypertension Father   . Diabetes type II Father   . Hyperlipidemia Father   . GER disease Father     Social History:  reports that he quit smoking about 8 years ago. His smoking use included cigarettes. He smoked 1.50 packs per day. He has never used smokeless tobacco. He reports current alcohol use. He reports that he does not use drugs.  ROS:   Urological Symptom Review  Patient is experiencing the following symptoms: Burning/pain with urination   Review of Systems  Gastrointestinal (upper)  : Nausea Vomiting  Gastrointestinal (lower) : Negative for lower GI symptoms  Constitutional : Negative for symptoms  Skin: Negative for skin symptoms  Eyes: Negative for eye symptoms  Ear/Nose/Throat : Negative for Ear/Nose/Throat symptoms  Hematologic/Lymphatic: Negative for Hematologic/Lymphatic symptoms  Cardiovascular : Negative for cardiovascular symptoms  Respiratory : Negative for respiratory symptoms  Endocrine: Negative for endocrine symptoms  Musculoskeletal: Negative for musculoskeletal symptoms  Neurological: Negative for neurological symptoms  Psychologic: Negative for psychiatric symptoms                                      Physical Exam: BP (!) 171/86   Pulse 88   Temp 98 F (36.7 C)   Ht 5\' 11"  (1.803 m)   Wt 260 lb (117.9 kg)   BMI  36.26 kg/m   Constitutional:  Alert and oriented, No acute distress. HEENT: [place] AT, moist mucus membranes.  Trachea midline, no masses. Cardiovascular: No clubbing, cyanosis, or edema. Respiratory: Normal respiratory effort, no increased work of breathing. GI: Abdomen is soft, nontender, nondistended, no abdominal masses GU: No CVA tenderness Lymph: No cervical or inguinal lymphadenopathy. Skin: No rashes, bruises or suspicious lesions. Neurologic: Grossly intact, no  focal deficits, moving all 4 extremities. Psychiatric: Normal mood and affect.  Laboratory Data: Lab Results  Component Value Date   WBC 14.7 (H) 01/04/2019   HGB 16.0 01/04/2019   HCT 49.0 01/04/2019   MCV 89.3 01/04/2019   PLT 277 01/04/2019    Lab Results  Component Value Date   CREATININE 1.53 (H) 01/05/2019    No results found for: PSA  No results found for: TESTOSTERONE  No results found for: HGBA1C  Urinalysis    Component Value Date/Time   COLORURINE YELLOW 01/05/2019 2056   APPEARANCEUR CLOUDY (A) 01/05/2019 2056   LABSPEC 1.017 01/05/2019 2056   PHURINE 5.0 01/05/2019 2056   GLUCOSEU NEGATIVE 01/05/2019 2056   HGBUR LARGE (A) 01/05/2019 2056   BILIRUBINUR NEGATIVE 01/05/2019 2056   KETONESUR 20 (A) 01/05/2019 2056   PROTEINUR 30 (A) 01/05/2019 2056   UROBILINOGEN 0.2 03/27/2010 1655   NITRITE NEGATIVE 01/05/2019 2056   LEUKOCYTESUR SMALL (A) 01/05/2019 2056    Lab Results  Component Value Date   BACTERIA RARE (A) 01/05/2019    Pertinent Imaging:  Results for orders placed during the hospital encounter of 01/04/19  DG Abd 1 View   Narrative CLINICAL DATA:  Left flank pain.  History of left ureteral calculus.  EXAM: ABDOMEN - 1 VIEW  COMPARISON:  CT scan 01/02/2019  FINDINGS: Stable appearing position of the left ureteral calculus located adjacent to the L4 vertebral body. Stable lower pole right renal calculus.  The bowel gas pattern is unremarkable. Scattered air in stool noted in the colon. No free air.  IMPRESSION: 1. Stable mid left ureteral calculus. 2. Right lower pole renal calculus. 3. Unremarkable bowel gas pattern.   Electronically Signed   By: Marijo Sanes M.D.   On: 01/04/2019 06:39    No results found for this or any previous visit. No results found for this or any previous visit. No results found for this or any previous visit. No results found for this or any previous visit. No results found for this or any  previous visit. No results found for this or any previous visit. Results for orders placed during the hospital encounter of 01/02/19  CT Renal Stone Study   Narrative CLINICAL DATA:  Left flank pain.  EXAM: CT ABDOMEN AND PELVIS WITHOUT CONTRAST  TECHNIQUE: Multidetector CT imaging of the abdomen and pelvis was performed following the standard protocol without IV contrast.  COMPARISON:  03/27/2010  FINDINGS: Lower chest: 10 mm lobular nodule in the left lower lobe is stable compared to chest CT of 08/11/2018. This nodule measured 9 mm on the cardiac CT of 10/17/2017 and was 5 mm on a chest CT of 02/11/2012.  Hepatobiliary: No suspicious focal abnormality within the liver parenchyma. There is no evidence for gallstones, gallbladder wall thickening, or pericholecystic fluid. No intrahepatic or extrahepatic biliary dilation.  Pancreas: No focal mass lesion. No dilatation of the main duct. No intraparenchymal cyst. No peripancreatic edema.  Spleen: No splenomegaly. No focal mass lesion.  Adrenals/Urinary Tract: No adrenal nodule or mass. 5 mm nonobstructing stone noted lower pole right kidney.  No right ureteral stone. No secondary changes in the right kidney or ureter. No left renal stones although there is mild left hydronephrosis. 7 x 7 x 4 mm stone identified in the proximal left ureter (axial 46/series 2). No other left urinary stone. 2.1 cm water density lesion lower pole left kidney is compatible with a cyst. No bladder stone.  Stomach/Bowel: Small to moderate hiatal hernia. Duodenum is normally positioned as is the ligament of Treitz. No small bowel wall thickening. No small bowel dilatation. The terminal ileum is normal. The appendix is best seen on coronal images and is unremarkable. No gross colonic mass. No colonic wall thickening. Diverticular changes are noted in the left colon without evidence of diverticulitis.  Vascular/Lymphatic: There is abdominal aortic  atherosclerosis without aneurysm. There is no gastrohepatic or hepatoduodenal ligament lymphadenopathy. No retroperitoneal or mesenteric lymphadenopathy. No pelvic sidewall lymphadenopathy.  Reproductive: Prostate gland is markedly enlarged.  Other: No intraperitoneal free fluid.  Musculoskeletal: No worrisome lytic or sclerotic osseous abnormality.  IMPRESSION: 1. 7 x 7 x 4 mm stone identified in the proximal left ureter with mild left hydronephrosis. 2. 5 mm nonobstructing right renal stone. No other urinary stone disease evident on today's study. 3. 10 mm lobular nodule left lower lobe has shown slow growth comparing back over almost 7 years to a study from 2014. Given the growth characteristics, this is probably benign. Conservatively, follow-up CT chest in 12 months could be used to reassess. 4. Small to moderate hiatal hernia. 5. Marked prostatomegaly. 6.  Aortic Atherosclerois (ICD10-170.0)   Electronically Signed   By: Misty Stanley M.D.   On: 01/02/2019 10:53     Assessment & Plan:    1. BPH with LUTS, incomplete emptying:   2. Nephrolithiasis: -Patient to be taken urgently for left ureteroscopic stone extraction. Risks/benefits/alternatives disucssed  No follow-ups on file.  [name], MD  [facility] [place]

## 2019-01-06 NOTE — Anesthesia Postprocedure Evaluation (Signed)
Anesthesia Post Note  Patient: James Norris  Procedure(s) Performed: CYSTOSCOPY/URETEROSCOPY//STENT PLACEMENT WITH LEFT RETROGRADE PYELOGRAM (Left )  Patient location during evaluation: PACU Anesthesia Type: General Level of consciousness: awake and alert and oriented Pain management: pain level controlled Vital Signs Assessment: post-procedure vital signs reviewed and stable Respiratory status: spontaneous breathing Cardiovascular status: stable Postop Assessment: no apparent nausea or vomiting Anesthetic complications: no     Last Vitals:  Vitals:   01/06/19 1609 01/06/19 1615  BP: 118/71 114/68  Pulse: 80 80  Resp: 11 12  Temp: 36.7 C   SpO2: 95% 95%    Last Pain:  Vitals:   01/06/19 1615  PainSc: 0-No pain                 Sharla Tankard A

## 2019-01-06 NOTE — Discharge Instructions (Signed)
Ureteral Stent Implantation, Care After This sheet gives you information about how to care for yourself after your procedure. Your health care provider may also give you more specific instructions. If you have problems or questions, contact your health care provider. What can I expect after the procedure? After the procedure, it is common to have:  Nausea.  Mild pain when you urinate. You may feel this pain in your lower back or lower abdomen. The pain should stop within a few minutes after you urinate. This may last for up to 1 week.  A small amount of blood in your urine for several days. Follow these instructions at home: Medicines  Take over-the-counter and prescription medicines only as told by your health care provider.  If you were prescribed an antibiotic medicine, take it as told by your health care provider. Do not stop taking the antibiotic even if you start to feel better.  Do not drive for 24 hours if you were given a sedative during your procedure.  Ask your health care provider if the medicine prescribed to you requires you to avoid driving or using heavy machinery. Activity  Rest as told by your health care provider.  Avoid sitting for a long time without moving. Get up to take short walks every 1-2 hours. This is important to improve blood flow and breathing. Ask for help if you feel weak or unsteady.  Return to your normal activities as told by your health care provider. Ask your health care provider what activities are safe for you. General instructions   Watch for any blood in your urine. Call your health care provider if the amount of blood in your urine increases.  If you have a catheter: ? Follow instructions from your health care provider about taking care of your catheter and collection bag. ? Do not take baths, swim, or use a hot tub until your health care provider approves. Ask your health care provider if you may take showers. You may only be allowed to  take sponge baths.  Drink enough fluid to keep your urine pale yellow.  Do not use any products that contain nicotine or tobacco, such as cigarettes, e-cigarettes, and chewing tobacco. These can delay healing after surgery. If you need help quitting, ask your health care provider.  Keep all follow-up visits as told by your health care provider. This is important. Contact a health care provider if:  You have pain that gets worse or does not get better with medicine, especially pain when you urinate.  You have difficulty urinating.  You feel nauseous or you vomit repeatedly during a period of more than 2 days after the procedure. Get help right away if:  Your urine is dark red or has blood clots in it.  You are leaking urine (have incontinence).  The end of the stent comes out of your urethra.  You cannot urinate.  You have sudden, sharp, or severe pain in your abdomen or lower back.  You have a fever.  You have swelling or pain in your legs.  You have difficulty breathing. Summary  After the procedure, it is common to have mild pain when you urinate that goes away within a few minutes after you urinate. This may last for up to 1 week.  Watch for any blood in your urine. Call your health care provider if the amount of blood in your urine increases.  Take over-the-counter and prescription medicines only as told by your health care provider.  Drink   enough fluid to keep your urine pale yellow. °This information is not intended to replace advice given to you by your health care provider. Make sure you discuss any questions you have with your health care provider. °Document Released: 08/26/2012 Document Revised: 09/30/2017 Document Reviewed: 10/01/2017 °Elsevier Patient Education © 2020 [employer] ° °

## 2019-01-06 NOTE — Anesthesia Procedure Notes (Signed)
Procedure Name: Intubation Date/Time: 01/06/2019 3:02 PM Performed by: Andree Elk, Fread Kottke A, CRNA Pre-anesthesia Checklist: Patient identified, Patient being monitored, Timeout performed, Emergency Drugs available and Suction available Patient Re-evaluated:Patient Re-evaluated prior to induction Oxygen Delivery Method: Circle system utilized Preoxygenation: Pre-oxygenation with 100% oxygen Induction Type: IV induction Ventilation: Mask ventilation without difficulty Laryngoscope Size: 3 and Glidescope Grade View: Grade I Tube type: Oral Tube size: 7.0 mm Number of attempts: 1 Airway Equipment and Method: Stylet and Video-laryngoscopy Placement Confirmation: ETT inserted through vocal cords under direct vision,  positive ETCO2 and breath sounds checked- equal and bilateral Secured at: 21 cm Tube secured with: Tape Dental Injury: Teeth and Oropharynx as per pre-operative assessment

## 2019-01-06 NOTE — Op Note (Signed)
.  Preoperative diagnosis: Left ureteral calculi  Postoperative diagnosis: Same  Procedure: 1 cystoscopy 2. Left retrograde pyelography 3.  Intraoperative fluoroscopy, under one hour, with interpretation 4.  Left ureteroscopic stone extraction with basket 5.  Left 6 x 26 JJ stent placement  Attending: Rosie Fate  Anesthesia: General  Estimated blood loss: None  Drains: Left 6 x 26 JJ ureteral stent without tether  Specimens: none  Antibiotics: Gentamicin  Findings:multiple left proximal ureteral calculi visualized on fluoroscopy. 48mm distal calculus removed from ureter. Moderate hydronephrosis. No masses/lesions in the bladder. Ureteral orifices in normal anatomic location.  Indications: Patient is a 68 year old male with a history of left ureteral calculus who underwent left ESWl and developed intractable left flank pain.  After discussing treatment options, she decided proceed with left ureteroscopic stone manipulation.  Procedure her in detail: The patient was brought to the operating room and a brief timeout was done to ensure correct patient, correct procedure, correct site.  General anesthesia was administered patient was placed in dorsal lithotomy position.  Her genitalia was then prepped and draped in usual sterile fashion.  A rigid 54 French cystoscope was passed in the urethra and the bladder. The patient has a very large prostate with a large median lobe which made the case difficult. Bladder was inspected free masses or lesions.  the ureteral orifices were in the normal orthotopic locations.  a 6 french ureteral catheter was then instilled into the left ureteral orifice.  a gentle retrograde was obtained and findings noted above.  we then placed a zip wire through the ureteral catheter and advanced up to the renal pelvis.  we then removed the cystoscope and cannulated the left ureteral orifice with a semirigid ureteroscope. We located a small calculus in the distal ureteral  which was removed. We were unable to advance the scope over the iliac vessels and we elected to place a stent. we then placed a 6 x 26 double-j ureteral stent over the original zip wire.   We then removed the wire and good coil was noted in the the renal pelvis under fluoroscopy and the bladder under direct vision.    the bladder was then drained and this concluded the procedure which was well tolerated by patient.  Complications: None  Condition: Stable, extubated, transferred to PACU  Plan: Patient is to be discharged home as to follow-up in 2 weeks for left ureteroscopic stone extraction

## 2019-01-07 DIAGNOSIS — Z20828 Contact with and (suspected) exposure to other viral communicable diseases: Secondary | ICD-10-CM | POA: Diagnosis not present

## 2019-01-09 ENCOUNTER — Encounter: Payer: Self-pay | Admitting: Urology

## 2019-01-11 ENCOUNTER — Other Ambulatory Visit: Payer: Self-pay | Admitting: Internal Medicine

## 2019-01-11 ENCOUNTER — Other Ambulatory Visit (HOSPITAL_COMMUNITY): Payer: Self-pay | Admitting: Internal Medicine

## 2019-01-11 ENCOUNTER — Other Ambulatory Visit: Payer: Self-pay

## 2019-01-11 ENCOUNTER — Ambulatory Visit (HOSPITAL_COMMUNITY)
Admission: RE | Admit: 2019-01-11 | Discharge: 2019-01-11 | Disposition: A | Discharge status: Home / Self Care | Payer: PPO | Source: Ambulatory Visit | Attending: Internal Medicine | Admitting: Internal Medicine

## 2019-01-11 ENCOUNTER — Encounter: Payer: Self-pay | Admitting: Urology

## 2019-01-11 DIAGNOSIS — M79604 Pain in right leg: Secondary | ICD-10-CM

## 2019-01-11 DIAGNOSIS — I8001 Phlebitis and thrombophlebitis of superficial vessels of right lower extremity: Secondary | ICD-10-CM | POA: Diagnosis not present

## 2019-01-11 DIAGNOSIS — M79661 Pain in right lower leg: Secondary | ICD-10-CM | POA: Diagnosis not present

## 2019-01-12 DIAGNOSIS — N201 Calculus of ureter: Secondary | ICD-10-CM | POA: Diagnosis not present

## 2019-01-13 ENCOUNTER — Other Ambulatory Visit: Payer: Self-pay | Admitting: Urology

## 2019-01-15 DIAGNOSIS — Z6834 Body mass index (BMI) 34.0-34.9, adult: Secondary | ICD-10-CM | POA: Diagnosis not present

## 2019-01-15 DIAGNOSIS — N400 Enlarged prostate without lower urinary tract symptoms: Secondary | ICD-10-CM | POA: Diagnosis not present

## 2019-01-15 DIAGNOSIS — L03115 Cellulitis of right lower limb: Secondary | ICD-10-CM | POA: Diagnosis not present

## 2019-01-15 DIAGNOSIS — Z1389 Encounter for screening for other disorder: Secondary | ICD-10-CM | POA: Diagnosis not present

## 2019-01-15 DIAGNOSIS — I700 Atherosclerosis of aorta: Secondary | ICD-10-CM | POA: Diagnosis not present

## 2019-01-18 ENCOUNTER — Other Ambulatory Visit (HOSPITAL_COMMUNITY): Payer: Self-pay | Admitting: Internal Medicine

## 2019-01-18 ENCOUNTER — Other Ambulatory Visit: Payer: Self-pay | Admitting: Internal Medicine

## 2019-01-18 DIAGNOSIS — N201 Calculus of ureter: Secondary | ICD-10-CM | POA: Diagnosis not present

## 2019-01-18 DIAGNOSIS — M79604 Pain in right leg: Secondary | ICD-10-CM

## 2019-01-19 ENCOUNTER — Ambulatory Visit (HOSPITAL_COMMUNITY)
Admission: RE | Admit: 2019-01-19 | Discharge: 2019-01-19 | Disposition: A | Discharge status: Home / Self Care | Payer: PPO | Source: Ambulatory Visit | Attending: Internal Medicine | Admitting: Internal Medicine

## 2019-01-19 ENCOUNTER — Other Ambulatory Visit: Payer: Self-pay

## 2019-01-19 ENCOUNTER — Encounter (INDEPENDENT_AMBULATORY_CARE_PROVIDER_SITE_OTHER): Payer: Self-pay

## 2019-01-19 DIAGNOSIS — I8001 Phlebitis and thrombophlebitis of superficial vessels of right lower extremity: Secondary | ICD-10-CM | POA: Diagnosis not present

## 2019-01-19 DIAGNOSIS — M79604 Pain in right leg: Secondary | ICD-10-CM | POA: Insufficient documentation

## 2019-01-21 ENCOUNTER — Other Ambulatory Visit: Payer: Self-pay

## 2019-01-21 ENCOUNTER — Telehealth: Payer: Self-pay | Admitting: Urology

## 2019-01-21 ENCOUNTER — Encounter (HOSPITAL_COMMUNITY)
Admission: RE | Admit: 2019-01-21 | Discharge: 2019-01-21 | Disposition: A | Discharge status: Home / Self Care | Payer: PPO | Source: Ambulatory Visit | Attending: Urology | Admitting: Urology

## 2019-01-21 NOTE — Pre-Procedure Instructions (Signed)
Called patient to do PAT via phone. He was very upset and "in terrible discomfort". He states, "I have left several messages at Dr Ruel Favors office for someone to call me back and they have not. I do not want to have another stent put back in, I cannot stand it". I assured him I would try and contact office and have a medical personal call him. I left a message both in the [place] and the [facility] offices and did not receive a phone call back. I then contacted Dr Alyson Ingles who states that patient has been in contact with and or been to the office multiple times in the last week. He asks that I call patient back and let him know if at all possible, he will not have to replace the stent and that he is calling him in more pain medication to get him through Monday. I called patient back and he verbalized understanding of the afore mentioned information and was very appreciative of the call back.

## 2019-01-21 NOTE — Telephone Encounter (Signed)
Pre Op called and left VM, they stated the patient was unhappy. They never stated about what. They requested someone call them back about his pre op testing that was supposed to be done today.

## 2019-01-21 NOTE — Telephone Encounter (Signed)
What number do I call?

## 2019-01-21 NOTE — Telephone Encounter (Signed)
I called the number that was left [phone] twice.  There is no VM box to leave any messages

## 2019-01-21 NOTE — Pre-Procedure Instructions (Signed)
Dr Alyson Ingles is aware that patient has also been placed on Xarelto and he is okay with this.

## 2019-01-22 ENCOUNTER — Other Ambulatory Visit: Payer: Self-pay

## 2019-01-22 ENCOUNTER — Other Ambulatory Visit (HOSPITAL_COMMUNITY)
Admission: RE | Admit: 2019-01-22 | Discharge: 2019-01-22 | Disposition: A | Discharge status: Home / Self Care | Payer: PPO | Source: Ambulatory Visit | Attending: Urology | Admitting: Urology

## 2019-01-23 ENCOUNTER — Other Ambulatory Visit (HOSPITAL_COMMUNITY)
Admission: RE | Admit: 2019-01-23 | Discharge: 2019-01-23 | Disposition: A | Discharge status: Home / Self Care | Payer: PPO | Source: Ambulatory Visit | Attending: Urology | Admitting: Urology

## 2019-01-23 DIAGNOSIS — Z01812 Encounter for preprocedural laboratory examination: Secondary | ICD-10-CM | POA: Insufficient documentation

## 2019-01-23 DIAGNOSIS — Z20822 Contact with and (suspected) exposure to covid-19: Secondary | ICD-10-CM | POA: Diagnosis not present

## 2019-01-23 LAB — SARS CORONAVIRUS 2 (TAT 6-24 HRS): SARS Coronavirus 2: NEGATIVE

## 2019-01-25 ENCOUNTER — Ambulatory Visit (HOSPITAL_COMMUNITY): Payer: PPO | Admitting: Anesthesiology

## 2019-01-25 ENCOUNTER — Ambulatory Visit (HOSPITAL_COMMUNITY)
Admission: RE | Admit: 2019-01-25 | Discharge: 2019-01-25 | Disposition: A | Discharge status: Home / Self Care | Payer: PPO | Attending: Urology | Admitting: Urology

## 2019-01-25 ENCOUNTER — Encounter (HOSPITAL_COMMUNITY)
Admission: RE | Disposition: A | Discharge status: Home / Self Care | Payer: Self-pay | Source: Home / Self Care | Attending: Urology

## 2019-01-25 ENCOUNTER — Encounter (HOSPITAL_COMMUNITY): Payer: Self-pay | Admitting: Urology

## 2019-01-25 ENCOUNTER — Ambulatory Visit (HOSPITAL_COMMUNITY): Payer: PPO

## 2019-01-25 DIAGNOSIS — G4730 Sleep apnea, unspecified: Secondary | ICD-10-CM | POA: Insufficient documentation

## 2019-01-25 DIAGNOSIS — Z87891 Personal history of nicotine dependence: Secondary | ICD-10-CM | POA: Insufficient documentation

## 2019-01-25 DIAGNOSIS — N201 Calculus of ureter: Secondary | ICD-10-CM | POA: Diagnosis not present

## 2019-01-25 DIAGNOSIS — Z87442 Personal history of urinary calculi: Secondary | ICD-10-CM | POA: Insufficient documentation

## 2019-01-25 DIAGNOSIS — K219 Gastro-esophageal reflux disease without esophagitis: Secondary | ICD-10-CM | POA: Insufficient documentation

## 2019-01-25 HISTORY — PX: CYSTOSCOPY W/ URETERAL STENT REMOVAL: SHX1430

## 2019-01-25 HISTORY — PX: URETEROSCOPY: SHX842

## 2019-01-25 HISTORY — PX: CYSTOSCOPY W/ RETROGRADES: SHX1426

## 2019-01-25 SURGERY — CYSTOSCOPY, WITH RETROGRADE PYELOGRAM
Anesthesia: General | Laterality: Left

## 2019-01-25 MED ORDER — LACTATED RINGERS IV SOLN: INTRAVENOUS | Status: DC | PRN

## 2019-01-25 MED ORDER — PROPOFOL 10 MG/ML IV BOLUS
INTRAVENOUS | Status: DC | PRN
  Administered 2019-01-25: 250 mg via INTRAVENOUS

## 2019-01-25 MED ORDER — FENTANYL CITRATE (PF) 250 MCG/5ML IJ SOLN
INTRAMUSCULAR | Status: AC
  Filled 2019-01-25: qty 5

## 2019-01-25 MED ORDER — CEFAZOLIN SODIUM-DEXTROSE 2-4 GM/100ML-% IV SOLN
INTRAVENOUS | Status: AC
  Filled 2019-01-25: qty 100

## 2019-01-25 MED ORDER — DIATRIZOATE MEGLUMINE 30 % UR SOLN
URETHRAL | Status: DC | PRN
  Administered 2019-01-25: 10 mL

## 2019-01-25 MED ORDER — ONDANSETRON HCL 4 MG/2ML IJ SOLN: 4.0000 mg | Freq: Once | INTRAMUSCULAR | Status: DC | PRN

## 2019-01-25 MED ORDER — HYDROMORPHONE HCL 1 MG/ML IJ SOLN: 0.2500 mg | INTRAMUSCULAR | Status: DC | PRN

## 2019-01-25 MED ORDER — LIDOCAINE HCL (CARDIAC) PF 50 MG/5ML IV SOSY
PREFILLED_SYRINGE | INTRAVENOUS | Status: DC | PRN
  Administered 2019-01-25: 60 mg via INTRAVENOUS

## 2019-01-25 MED ORDER — EPHEDRINE SULFATE 50 MG/ML IJ SOLN
INTRAMUSCULAR | Status: DC | PRN
  Administered 2019-01-25 (×2): 10 mg via INTRAVENOUS

## 2019-01-25 MED ORDER — MIDAZOLAM HCL 5 MG/5ML IJ SOLN
INTRAMUSCULAR | Status: DC | PRN
  Administered 2019-01-25: 2 mg via INTRAVENOUS

## 2019-01-25 MED ORDER — CEFAZOLIN SODIUM-DEXTROSE 2-4 GM/100ML-% IV SOLN
2.0000 g | INTRAVENOUS | Status: AC
  Administered 2019-01-25: 2 g via INTRAVENOUS

## 2019-01-25 MED ORDER — SODIUM CHLORIDE 0.9 % IR SOLN
Status: DC | PRN
  Administered 2019-01-25: 3000 mL via INTRAVESICAL

## 2019-01-25 MED ORDER — LIDOCAINE 2% (20 MG/ML) 5 ML SYRINGE
INTRAMUSCULAR | Status: AC
  Filled 2019-01-25: qty 5

## 2019-01-25 MED ORDER — SUCCINYLCHOLINE CHLORIDE 200 MG/10ML IV SOSY
PREFILLED_SYRINGE | INTRAVENOUS | Status: AC
  Filled 2019-01-25: qty 10

## 2019-01-25 MED ORDER — LACTATED RINGERS IV SOLN: Freq: Once | INTRAVENOUS | Status: AC

## 2019-01-25 MED ORDER — MIDAZOLAM HCL 2 MG/2ML IJ SOLN
INTRAMUSCULAR | Status: AC
  Filled 2019-01-25: qty 2

## 2019-01-25 MED ORDER — FENTANYL CITRATE (PF) 100 MCG/2ML IJ SOLN
INTRAMUSCULAR | Status: DC | PRN
  Administered 2019-01-25 (×2): 50 ug via INTRAVENOUS

## 2019-01-25 MED ORDER — PROPOFOL 10 MG/ML IV BOLUS
INTRAVENOUS | Status: AC
  Filled 2019-01-25: qty 20

## 2019-01-25 MED ORDER — SUCCINYLCHOLINE 20MG/ML (10ML) SYRINGE FOR MEDFUSION PUMP - OPTIME
INTRAMUSCULAR | Status: DC | PRN
  Administered 2019-01-25: 120 mg via INTRAVENOUS

## 2019-01-25 MED ORDER — MEPERIDINE HCL 50 MG/ML IJ SOLN: 6.2500 mg | INTRAMUSCULAR | Status: DC | PRN

## 2019-01-25 MED ORDER — DIATRIZOATE MEGLUMINE 30 % UR SOLN
URETHRAL | Status: AC
  Filled 2019-01-25: qty 100

## 2019-01-25 MED ORDER — WATER FOR IRRIGATION, STERILE IR SOLN
Status: DC | PRN
  Administered 2019-01-25: 1000 mL

## 2019-01-25 MED ORDER — OXYCODONE-ACETAMINOPHEN 10-325 MG PO TABS: 1.0000 | ORAL_TABLET | ORAL | 0 refills | Status: DC | PRN

## 2019-01-25 MED ORDER — ONDANSETRON HCL 4 MG/2ML IJ SOLN
INTRAMUSCULAR | Status: DC | PRN
  Administered 2019-01-25: 4 mg via INTRAVENOUS

## 2019-01-25 MED ORDER — ONDANSETRON HCL 4 MG/2ML IJ SOLN
INTRAMUSCULAR | Status: AC
  Filled 2019-01-25: qty 2

## 2019-01-25 SURGICAL SUPPLY — 25 items
BAG DRAIN URO TABLE W/ADPT NS (BAG) ×2 IMPLANT
BAG DRN 8 ADPR NS SKTRN CSTL (BAG) ×1
CATH INTERMIT  6FR 70CM (CATHETERS) ×2 IMPLANT
CLOTH BEACON ORANGE TIMEOUT ST (SAFETY) ×2 IMPLANT
DECANTER SPIKE VIAL GLASS SM (MISCELLANEOUS) ×2 IMPLANT
EXTRACTOR STONE NITINOL NGAGE (UROLOGICAL SUPPLIES) ×2 IMPLANT
FIBER LASER FLEXIVA 200 (UROLOGICAL SUPPLIES) ×2 IMPLANT
GLOVE BIO SURGEON STRL SZ8 (GLOVE) ×2 IMPLANT
GLOVE BIOGEL M 7.0 STRL (GLOVE) ×1 IMPLANT
GLOVE BIOGEL PI IND STRL 7.0 (GLOVE) ×2 IMPLANT
GLOVE BIOGEL PI INDICATOR 7.0 (GLOVE) ×2
GOWN STRL REUS W/ TWL XL LVL3 (GOWN DISPOSABLE) ×1 IMPLANT
GOWN STRL REUS W/TWL LRG LVL3 (GOWN DISPOSABLE) ×2 IMPLANT
GOWN STRL REUS W/TWL XL LVL3 (GOWN DISPOSABLE) ×2
GUIDEWIRE STR DUAL SENSOR (WIRE) ×2 IMPLANT
GUIDEWIRE STR ZIPWIRE 035X150 (MISCELLANEOUS) ×2 IMPLANT
IV NS IRRIG 3000ML ARTHROMATIC (IV SOLUTION) ×4 IMPLANT
KIT TURNOVER CYSTO (KITS) ×2 IMPLANT
MANIFOLD NEPTUNE II (INSTRUMENTS) ×2 IMPLANT
PACK CYSTO (CUSTOM PROCEDURE TRAY) ×2 IMPLANT
PAD ARMBOARD 7.5X6 YLW CONV (MISCELLANEOUS) ×2 IMPLANT
SHEATH URETERAL 12FRX35CM (MISCELLANEOUS) ×1 IMPLANT
SYR 10ML LL (SYRINGE) ×2 IMPLANT
TOWEL OR 17X26 4PK STRL BLUE (TOWEL DISPOSABLE) ×2 IMPLANT
WATER STERILE IRR 500ML POUR (IV SOLUTION) ×2 IMPLANT

## 2019-01-25 NOTE — Discharge Instructions (Signed)
Ureteroscopy Ureteroscopy is a procedure to check for and treat problems inside part of the urinary tract. In this procedure, a thin, tube-shaped instrument with a light at the end (ureteroscope) is used to look at the inside of the kidneys and the ureters, which are the tubes that carry urine from the kidneys to the bladder. The ureteroscope is inserted into one or both of the ureters. You may need this procedure if you have frequent urinary tract infections (UTIs), blood in your urine, or a stone in one of your ureters. A ureteroscopy can be done to find the cause of urine blockage in a ureter and to evaluate other abnormalities inside the ureters or kidneys. If stones are found, they can be removed during the procedure. Polyps, abnormal tissue, and some types of tumors can also be removed or treated. The ureteroscope may also have a tool to remove tissue to be checked for disease under a microscope (biopsy). Tell a health care provider about:  Any allergies you have.  All medicines you are taking, including vitamins, herbs, eye drops, creams, and over-the-counter medicines.  Any problems you or family members have had with anesthetic medicines.  Any blood disorders you have.  Any surgeries you have had.  Any medical conditions you have.  Whether you are pregnant or may be pregnant. What are the risks? Generally, this is a safe procedure. However, problems may occur, including:  Bleeding.  Infection.  Allergic reactions to medicines.  Scarring that narrows the ureter (stricture).  Creating a hole in the ureter (perforation). What happens before the procedure? Staying hydrated Follow instructions from your health care provider about hydration, which may include:  Up to 2 hours before the procedure - you may continue to drink clear liquids, such as water, clear fruit juice, black coffee, and plain tea. Eating and drinking restrictions Follow instructions from your health care  provider about eating and drinking, which may include:  8 hours before the procedure - stop eating heavy meals or foods such as meat, fried foods, or fatty foods.  6 hours before the procedure - stop eating light meals or foods, such as toast or cereal.  6 hours before the procedure - stop drinking milk or drinks that contain milk.  2 hours before the procedure - stop drinking clear liquids. Medicines  Ask your health care provider about: ? Changing or stopping your regular medicines. This is especially important if you are taking diabetes medicines or blood thinners. ? Taking medicines such as aspirin and ibuprofen. These medicines can thin your blood. Do not take these medicines before your procedure if your health care provider instructs you not to.  You may be given antibiotic medicine to help prevent infection. General instructions  You may have a urine sample taken to check for infection.  Plan to have someone take you home from the hospital or clinic. What happens during the procedure?   To reduce your risk of infection: ? Your health care team will wash or sanitize their hands. ? Your skin will be washed with soap.  An IV tube will be inserted into one of your veins.  You will be given one of the following: ? A medicine to help you relax (sedative). ? A medicine to make you fall asleep (general anesthetic). ? A medicine that is injected into your spine to numb the area below and slightly above the injection site (spinal anesthetic).  To lower your risk of infection, you may be given an antibiotic medicine   by an injection or through the IV tube. °· The opening from which you urinate (urethra) will be cleaned with a germ-killing solution. °· The ureteroscope will be passed through your urethra into your bladder. °· A salt-water solution will flow through the ureteroscope to fill your bladder. This will help the health care provider see the openings of your ureters more  clearly. °· Then, the ureteroscope will be passed into your ureter. °? If a growth is found, a piece of it may be removed so it can be examined under a microscope (biopsy). °? If a stone is found, it may be removed through the ureteroscope, or the stone may be broken up using a laser, shock waves, or electrical energy. °? In some cases, if the ureter is too small, a tube may be inserted that keeps the ureter open (ureteral stent). The stent may be left in place for 1 or 2 weeks to keep the ureter open, and then the ureteroscopy procedure will be performed. °· The scope will be removed, and your bladder will be emptied. °The procedure may vary among health care providers and hospitals. °What happens after the procedure? °· Your blood pressure, heart rate, breathing rate, and blood oxygen level will be monitored until the medicines you were given have worn off. °· You may be asked to urinate. °· Do not drive for 24 hours if you were given a sedative. °This information is not intended to replace advice given to you by your health care provider. Make sure you discuss any questions you have with your health care provider. °Document Revised: 12/06/2016 Document Reviewed: 10/06/2015 °Elsevier Patient Education © 2020 [employer] ° ° °General Anesthesia, Adult, Care After °This sheet gives you information about how to care for yourself after your procedure. Your health care provider may also give you more specific instructions. If you have problems or questions, contact your health care provider. °What can I expect after the procedure? °After the procedure, the following side effects are common: °· Pain or discomfort at the IV site. °· Nausea. °· Vomiting. °· Sore throat. °· Trouble concentrating. °· Feeling cold or chills. °· Weak or tired. °· Sleepiness and fatigue. °· Soreness and body aches. These side effects can affect parts of the body that were not involved in surgery. °Follow these instructions at home: ° °For at  least 24 hours after the procedure: °· Have a responsible adult stay with you. It is important to have someone help care for you until you are awake and alert. °· Rest as needed. °· Do not: °? Participate in activities in which you could fall or become injured. °? Drive. °? Use heavy machinery. °? Drink alcohol. °? Take sleeping pills or medicines that cause drowsiness. °? Make important decisions or sign legal documents. °? Take care of children on your own. °Eating and drinking °· Follow any instructions from your health care provider about eating or drinking restrictions. °· When you feel hungry, start by eating small amounts of foods that are soft and easy to digest (bland), such as toast. Gradually return to your regular diet. °· Drink enough fluid to keep your urine pale yellow. °· If you vomit, rehydrate by drinking water, juice, or clear broth. °General instructions °· If you have sleep apnea, surgery and certain medicines can increase your risk for breathing problems. Follow instructions from your health care provider about wearing your sleep device: °? Anytime you are sleeping, including during daytime naps. °? While taking prescription pain medicines, sleeping   medicines, or medicines that make you drowsy. °· Return to your normal activities as told by your health care provider. Ask your health care provider what activities are safe for you. °· Take over-the-counter and prescription medicines only as told by your health care provider. °· If you smoke, do not smoke without supervision. °· Keep all follow-up visits as told by your health care provider. This is important. °Contact a health care provider if: °· You have nausea or vomiting that does not get better with medicine. °· You cannot eat or drink without vomiting. °· You have pain that does not get better with medicine. °· You are unable to pass urine. °· You develop a skin rash. °· You have a fever. °· You have redness around your IV site that gets  worse. °Get help right away if: °· You have difficulty breathing. °· You have chest pain. °· You have blood in your urine or stool, or you vomit blood. °Summary °· After the procedure, it is common to have a sore throat or nausea. It is also common to feel tired. °· Have a responsible adult stay with you for the first 24 hours after general anesthesia. It is important to have someone help care for you until you are awake and alert. °· When you feel hungry, start by eating small amounts of foods that are soft and easy to digest (bland), such as toast. Gradually return to your regular diet. °· Drink enough fluid to keep your urine pale yellow. °· Return to your normal activities as told by your health care provider. Ask your health care provider what activities are safe for you. °This information is not intended to replace advice given to you by your health care provider. Make sure you discuss any questions you have with your health care provider. °Document Revised: 12/27/2016 Document Reviewed: 08/09/2016 °Elsevier Patient Education © 2020 [employer] ° °

## 2019-01-25 NOTE — Anesthesia Postprocedure Evaluation (Signed)
Anesthesia Post Note  Patient: [name]  Procedure(s) Performed: CYSTOSCOPY WITH LEFT RETROGRADE PYELOGRAM (Left ) CYSTOSCOPY WITH LEFT URETERAL STENT REMOVAL (Left ) DIAGNOSTIC LEFT URETEROSCOPY (Left )  Patient location during evaluation: PACU Anesthesia Type: General Level of consciousness: awake and alert and oriented Pain management: pain level controlled Vital Signs Assessment: post-procedure vital signs reviewed and stable Respiratory status: spontaneous breathing Cardiovascular status: blood pressure returned to baseline and stable Postop Assessment: no apparent nausea or vomiting Anesthetic complications: no     Last Vitals:  Vitals:   01/25/19 0930 01/25/19 0941  BP: (!) 143/82 139/83  Pulse: 73 74  Resp: 17 18  Temp:  36.7 C  SpO2: 94% 97%    Last Pain:  Vitals:   01/25/19 0941  TempSrc: Oral  PainSc: 1                  Gaytha Raybourn

## 2019-01-25 NOTE — Anesthesia Preprocedure Evaluation (Signed)
Anesthesia Evaluation  Patient identified by MRN, date of birth, ID band Patient awake    Reviewed: Allergy & Precautions, NPO status , Patient's Chart, lab work & pertinent test results  Airway Mallampati: II  TM Distance: >3 FB Neck ROM: Full    Dental no notable dental hx.    Pulmonary sleep apnea , former smoker,    Pulmonary exam normal breath sounds clear to auscultation       Cardiovascular Exercise Tolerance: Good Normal cardiovascular exam Rhythm:Regular Rate:Normal  EKG- Incomplete RBBB, LAFB ECHO -  - Left ventricle: The cavity size was normal. Wall thickness was   increased in a pattern of mild LVH. Systolic function was normal.   The estimated ejection fraction was in the range of 60% to 65%.   Wall motion was normal; there were no regional wall motion   abnormalities. Left ventricular diastolic function parameters   were normal for the patient&'s age. - Aortic valve: Mildly calcified annulus. Probably trileaflet;   mildly calcified leaflets. - Mitral valve: Mildly calcified annulus. There was trivial   regurgitation. - Left atrium: The atrium was at the upper limits of normal in   size. - Right atrium: Central venous pressure (est): 3 mm Hg. - Atrial septum: No defect or patent foramen ovale was identified. - Tricuspid valve: There was trivial regurgitation. - Pulmonary arteries: PA peak pressure: 17 mm Hg (S). - Pericardium, extracardiac: There was no pericardial effusion.    Neuro/Psych Anxiety  Neuromuscular disease    GI/Hepatic GERD  Medicated,  Endo/Other  negative endocrine ROS  Renal/GU Renal InsufficiencyRenal disease     Musculoskeletal   Abdominal   Peds  Hematology  (+) anemia ,   Anesthesia Other Findings   Reproductive/Obstetrics                             Anesthesia Physical Anesthesia Plan  ASA: III  Anesthesia Plan: General   Post-op Pain  Management:    Induction: Intravenous  PONV Risk Score and Plan: 3 and Midazolam, Ondansetron and Dexamethasone  Airway Management Planned: Oral ETT  Additional Equipment:   Intra-op Plan:   Post-operative Plan: Extubation in OR  Informed Consent: I have reviewed the patients History and Physical, chart, labs and discussed the procedure including the risks, benefits and alternatives for the proposed anesthesia with the patient or authorized representative who has indicated his/her understanding and acceptance.     Dental advisory given  Plan Discussed with: CRNA  Anesthesia Plan Comments:         Anesthesia Quick Evaluation

## 2019-01-25 NOTE — Transfer of Care (Signed)
Immediate Anesthesia Transfer of Care Note  Patient: James Norris  Procedure(s) Performed: CYSTOSCOPY WITH  LEFT  RETROGRADE PYELOGRAM, LEFT DIAGNOSTIC URETEROSCOPY AND LEFT URETERAL STENT REMOVAL                                                          STENT EXCHANGE (Left Bladder)  Patient Location: Short Stay  Anesthesia Type:General  Level of Consciousness: awake  Airway & Oxygen Therapy: Patient Spontanous Breathing  Post-op Assessment: Report given to RN  Post vital signs: Reviewed and stable  Last Vitals:  Vitals Value Taken Time  BP    Temp    Pulse 78 01/25/19 0911  Resp 11 01/25/19 0911  SpO2 95 % 01/25/19 0911  Vitals shown include unvalidated device data.  Last Pain:  Vitals:   01/25/19 0721  TempSrc: Oral  PainSc: 0-No pain      Patients Stated Pain Goal: 7 ([id number])  Complications: No apparent anesthesia complications

## 2019-01-25 NOTE — Op Note (Signed)
.  Preoperative diagnosis: Left ureteral stone  Postoperative diagnosis: Same  Procedure: 1 cystoscopy 2.  Intraoperative fluoroscopy, under one hour, with interpretation 3.  Left ureteroscopic stone manipulation with basket extraction 4.  Left 6 x 26 JJ stent removal  Attending: Rosie Fate  Anesthesia: General  Estimated blood loss: None  Drains: Left 6 x 26 JJ ureteral stent without tether  Specimens: none  Antibiotics: ancef  Findings: left distal ureteral calculi manipulated into the bladder. Due to the size of his prostate we were unable to retrieve the stones from his bladder. No masses/lesions in the bladder. Ureteral orifices in normal anatomic location.  Indications: Patient is a 69 year old male with a history of left ureteral stone and who has persistent left flank pain.  After discussing treatment options, he decided proceed with left ureteroscopic stone manipulation.  Procedure her in detail: The patient was brought to the operating room and a brief timeout was done to ensure correct patient, correct procedure, correct site.  General anesthesia was administered patient was placed in dorsal lithotomy position.  Her genitalia was then prepped and draped in usual sterile fashion.  A rigid 47 French cystoscope was passed in the urethra and the bladder.  Bladder was inspected free masses or lesions.  the ureteral orifices were in the normal orthotopic locations. Using a grasper the left ureteral stent was brought to the urethral meatus. A zipwire was advanced through the stent and up to the renal pelvis. The stent was then removed. we then removed the cystoscope and cannulated the left ureteral orifice with a semirigid ureteroscope. We removed multiple distal ureteral calculi with a basket. Once we reached the UPJ a sensor wire was advanced in to the renal pelvis. We then removed the ureteroscope and advanced am 12/14 x 35cm access sheath up to the renal pelvis. We then used the  flexible ureteroscope to perform nephroscopy. We encountered no stones in the kidney.  We then removed the access sheath under direct vision and noted no injury to the ureter. the bladder was then drained and this concluded the procedure which was well tolerated by patient.  Complications: None  Condition: Stable, extubated, transferred to PACU  Plan: Patient is to be discharged home as to follow-up in one week

## 2019-01-25 NOTE — Interval H&P Note (Signed)
History and Physical Interval Note:  01/25/2019 8:00 AM  [name]  has presented today for surgery, with the diagnosis of LEFT URETERAL CALCULUS.  The various methods of treatment have been discussed with the patient and family. After consideration of risks, benefits and other options for treatment, the patient has consented to  Procedure(s): CYSTOSCOPY WITH  LEFT  RETROGRADE PYELOGRAM, LEFT URETEROSCOPY AND STENT PLACEMENT (Left) HOLMIUM LASER APPLICATION (Left) as a surgical intervention.  The patient's history has been reviewed, patient examined, no change in status, stable for surgery.  I have reviewed the patient's chart and labs.  Questions were answered to the patient's satisfaction.     [name]

## 2019-01-25 NOTE — Anesthesia Procedure Notes (Signed)
Procedure Name: Intubation Date/Time: 01/25/2019 8:20 AM Performed by: Ollen Bowl, CRNA Pre-anesthesia Checklist: Patient identified, Patient being monitored, Timeout performed, Emergency Drugs available and Suction available Patient Re-evaluated:Patient Re-evaluated prior to induction Oxygen Delivery Method: Circle System Utilized Preoxygenation: Pre-oxygenation with 100% oxygen Induction Type: IV induction Ventilation: Mask ventilation without difficulty Laryngoscope Size: Mac and 4 Grade View: Grade I Tube type: Oral Tube size: 7.5 mm Number of attempts: 1 Airway Equipment and Method: stylet Placement Confirmation: ETT inserted through vocal cords under direct vision,  positive ETCO2 and breath sounds checked- equal and bilateral Secured at: 23 cm Tube secured with: Tape Dental Injury: Teeth and Oropharynx as per pre-operative assessment

## 2019-01-26 ENCOUNTER — Ambulatory Visit (INDEPENDENT_AMBULATORY_CARE_PROVIDER_SITE_OTHER): Payer: PPO | Admitting: Internal Medicine

## 2019-01-26 ENCOUNTER — Other Ambulatory Visit: Payer: Self-pay

## 2019-01-26 DIAGNOSIS — K760 Fatty (change of) liver, not elsewhere classified: Secondary | ICD-10-CM | POA: Diagnosis not present

## 2019-01-26 NOTE — Patient Instructions (Signed)
Physician will call with results of blood test when completed. 

## 2019-01-26 NOTE — Progress Notes (Signed)
Presenting complaint;  Follow for chronic GERD and elevated ALT. History of iron deficiency anemia.  Database and subjective:  James Norris is 69 year old Caucasian male who has history of erosive reflux esophagitis distal esophageal ring and a moderate-sized sliding hiatal hernia who has required esophageal dilation in the past.  Last EGD with ED was in February 2017 and one before that was in December 2012.  He is up-to-date repeat CRC screening.  Colonoscopy was in March 2014.  He states he had rough several days because of urolithiasis.  He had lithotripsy followed by cystoscopy with stent placement and he continued to have pain in [place] he had stent removed yesterday and he feels a lot better. He says he is doing well from GI standpoint.  He rarely has heartburn.  He denies dysphagia nausea vomiting.  He states he is a fast eater.  He is using CPAP.  He does have sore throat when he forgets to use CPAP.  His bowels been moving regularly.  He did experience constipation when he took pain medication for renal colic.  No history of melena or rectal bleeding.  He stays busy but does not do regular exercise or walking.  He has not gained any weight. He has not had blood work that was planned to be done few months ago.   Current Medications: Outpatient Encounter Medications as of 01/26/2019  Medication Sig  . ALPRAZolam (XANAX) 0.5 MG tablet Take 0.5 mg by mouth 3 (three) times daily as needed for anxiety.   Marland Kitchen atorvastatin (LIPITOR) 20 MG tablet Take 20 mg by mouth daily.  . ferrous sulfate 325 (65 FE) MG EC tablet Take 325 mg by mouth daily with breakfast.   . Multiple Vitamin (MULTIVITAMIN) tablet Take 1 tablet by mouth daily.  . pantoprazole (PROTONIX) 40 MG tablet Take 1 tablet (40 mg total) by mouth daily before breakfast.  . XARELTO 15 MG TABS tablet Take 15 mg by mouth 2 (two) times daily.  . [DISCONTINUED] aspirin EC 81 MG tablet Take 81 mg by mouth daily.  . [DISCONTINUED] Methen-Hyosc-Meth  Blue-Na Phos (UROGESIC-BLUE) 81.6 MG TABS Take 1 tablet by mouth 3 (three) times daily.  . [DISCONTINUED] MYRBETRIQ 25 MG TB24 tablet Take 25 mg by mouth daily.  . [DISCONTINUED] ondansetron (ZOFRAN ODT) 4 MG disintegrating tablet Take 1 tablet (4 mg total) by mouth every 8 (eight) hours as needed for nausea. (Patient not taking: Reported on 01/26/2019)  . [DISCONTINUED] oxyCODONE-acetaminophen (PERCOCET) 10-325 MG tablet Take 1 tablet by mouth every 4 (four) hours as needed for pain. (Patient not taking: Reported on 01/26/2019)   No facility-administered encounter medications on file as of 01/26/2019.     Objective: Blood pressure (!) 157/94, pulse 76, temperature (!) 97.5 F (36.4 C), temperature source Temporal, height '5\' 11"'  (1.803 m), weight 258 lb 4.8 oz (117.2 kg). Patient is alert and in no acute distress. He is wearing a mask. Conjunctiva is pink. Sclera is nonicteric Oropharyngeal mucosa is normal. No neck masses or thyromegaly noted. Cardiac exam with regular rhythm normal S1 and S2. No murmur or gallop noted. Lungs are clear to auscultation. Abdomen is full but soft and nontender with organomegaly or masses. No LE edema or clubbing noted.  Labs/studies Results:  CBC Latest Ref Rng & Units 01/04/2019 03/27/2010  WBC 4.0 - 10.5 K/uL 14.7(H) 13.0(H)  Hemoglobin 13.0 - 17.0 g/dL 16.0 14.4  Hematocrit 39.0 - 52.0 % 49.0 43.1  Platelets 150 - 400 K/uL 277 313    CMP  Latest Ref Rng & Units 01/05/2019 01/04/2019 03/13/2018  Glucose 70 - 99 mg/dL 129(H) 158(H) -  BUN 8 - 23 mg/dL 20 16 -  Creatinine 0.61 - 1.24 mg/dL 1.53(H) 1.27(H) -  Sodium 135 - 145 mmol/L 135 140 -  Potassium 3.5 - 5.1 mmol/L 4.1 4.5 -  Chloride 98 - 111 mmol/L 101 106 -  CO2 22 - 32 mmol/L 25 27 -  Calcium 8.9 - 10.3 mg/dL 8.7(L) 9.4 -  Total Protein 6.1 - 8.1 g/dL - - 6.5  Total Bilirubin 0.2 - 1.2 mg/dL - - 0.9  Alkaline Phos 39 - 117 U/L - - -  AST 10 - 35 U/L - - 32  ALT 9 - 46 U/L - - 54(H)     Hepatic Function Latest Ref Rng & Units 03/13/2018 03/27/2010  Total Protein 6.1 - 8.1 g/dL 6.5 6.4  Albumin 3.5 - 5.2 g/dL - 4.1  AST 10 - 35 U/L 32 20  ALT 9 - 46 U/L 54(H) 21  Alk Phosphatase 39 - 117 U/L - 82  Total Bilirubin 0.2 - 1.2 mg/dL 0.9 0.7  Bilirubin, Direct 0.0 - 0.2 mg/dL 0.2 -     Assessment:  #1.  History of erosive reflux esophagitis and moderate-sized sliding hiatal hernia.  He also has history of Schatzki's ring which has been dilated twice.  GERD symptoms are well controlled and he is not having dysphagia.  He will continue PPI therapy for now.  #2.  History of elevated ALT.  Ultrasound back in March 25, 2018 suggested fatty liver.  He is due for repeat LFTs as well as screening for hep B and C.  #3 history of iron deficiency anemia.  Recent H&H was normal.  Iron deficiency anemia few years ago felt to be due to impaired iron absorption secondary to chronic acid suppression.  Plan:  He will go to the lab for LFTs, hepatitis B surface antigen and HCV antibody. Patient advised to consider exercise/brisk walking for at least 2 hours/week.  He can do 30 minutes at a time x4/week. Office visit in 1 year.

## 2019-01-27 LAB — HEPATIC FUNCTION PANEL
AG Ratio: 2 (calc) (ref 1.0–2.5)
ALT: 26 U/L (ref 9–46)
AST: 17 U/L (ref 10–35)
Albumin: 4.1 g/dL (ref 3.6–5.1)
Alkaline phosphatase (APISO): 95 U/L (ref 35–144)
Bilirubin, Direct: 0.1 mg/dL (ref 0.0–0.2)
Globulin: 2.1 g/dL (calc) (ref 1.9–3.7)
Indirect Bilirubin: 0.3 mg/dL (calc) (ref 0.2–1.2)
Total Bilirubin: 0.4 mg/dL (ref 0.2–1.2)
Total Protein: 6.2 g/dL (ref 6.1–8.1)

## 2019-01-27 LAB — HEPATITIS C ANTIBODY
Hepatitis C Ab: NONREACTIVE
SIGNAL TO CUT-OFF: 0.01 (ref <1.00)

## 2019-01-27 LAB — HEPATITIS B SURFACE ANTIGEN: Hepatitis B Surface Ag: NONREACTIVE

## 2019-02-03 ENCOUNTER — Other Ambulatory Visit: Payer: Self-pay

## 2019-02-03 ENCOUNTER — Telehealth: Payer: Self-pay | Admitting: Urology

## 2019-02-03 ENCOUNTER — Ambulatory Visit: Payer: PPO | Admitting: Urology

## 2019-02-03 DIAGNOSIS — N200 Calculus of kidney: Secondary | ICD-10-CM

## 2019-02-03 NOTE — Telephone Encounter (Signed)
3-4 week f/u post op with renal u/s. I will place order for that.

## 2019-02-03 NOTE — Telephone Encounter (Signed)
Patients wife called to reschedule his appointment for today because he is out of town. She says he is doing James Norris and not having issues. When should we reschedule his post op?

## 2019-02-22 ENCOUNTER — Encounter (INDEPENDENT_AMBULATORY_CARE_PROVIDER_SITE_OTHER): Payer: PPO | Admitting: Ophthalmology

## 2019-02-26 ENCOUNTER — Ambulatory Visit (HOSPITAL_COMMUNITY)
Admission: RE | Admit: 2019-02-26 | Discharge: 2019-02-26 | Disposition: A | Discharge status: Home / Self Care | Payer: PPO | Source: Ambulatory Visit | Attending: Urology | Admitting: Urology

## 2019-02-26 ENCOUNTER — Other Ambulatory Visit: Payer: Self-pay

## 2019-02-26 DIAGNOSIS — N200 Calculus of kidney: Secondary | ICD-10-CM | POA: Diagnosis not present

## 2019-03-03 ENCOUNTER — Ambulatory Visit (INDEPENDENT_AMBULATORY_CARE_PROVIDER_SITE_OTHER): Payer: PPO | Admitting: Urology

## 2019-03-03 ENCOUNTER — Encounter: Payer: Self-pay | Admitting: Urology

## 2019-03-03 ENCOUNTER — Other Ambulatory Visit: Payer: Self-pay

## 2019-03-03 VITALS — BP 156/95 | HR 98 | Temp 97.9°F | Ht 72.0 in | Wt 250.0 lb

## 2019-03-03 DIAGNOSIS — N200 Calculus of kidney: Secondary | ICD-10-CM | POA: Diagnosis not present

## 2019-03-03 DIAGNOSIS — N401 Enlarged prostate with lower urinary tract symptoms: Secondary | ICD-10-CM | POA: Diagnosis not present

## 2019-03-03 NOTE — Patient Instructions (Signed)

## 2019-03-03 NOTE — Progress Notes (Signed)
03/03/2019 2:02 PM   [name] Feb 21, 1950 [id number]  Referring provider: Redmond School, MD [address] [place],  [place] [address]  Nephrolithiasis and BPH  HPI: Mr Kingham is a 69yo with a hx of nephrolithiasis and BPH. NO stone events since last visit. Renal US shows bilateral lower pole calculi. He has severe LUTS on alpha blocker therapy. Prostate on [place] is 227ml. He had an episode of gross hematuria last week. He also has bladder calculi on bladder US   PMH: Past Medical History:  Diagnosis Date  . Anxiety   . GERD (gastroesophageal reflux disease)   . Hyperlipemia   . Kidney stone   . Nephrolithiasis 01/06/2019  . Sleep apnea     Surgical History: Past Surgical History:  Procedure Laterality Date  . BALLOON DILATION N/A 03/11/2012   Procedure: BALLOON DILATION;  Surgeon: Rogene Houston, MD;  [facility];  Service: Endoscopy;  Laterality: N/A;  . COLONOSCOPY WITH ESOPHAGOGASTRODUODENOSCOPY (EGD) N/A 03/11/2012   Procedure: COLONOSCOPY WITH ESOPHAGOGASTRODUODENOSCOPY (EGD);  Surgeon: Rogene Houston, MD;  [facility];  Service: Endoscopy;  Laterality: N/A;  1200  . CYSTOSCOPY W/ RETROGRADES Left 01/25/2019   Procedure: CYSTOSCOPY WITH LEFT RETROGRADE PYELOGRAM;  Surgeon: Cleon Gustin, MD;  Location: [facility];  Service: Urology;  Laterality: Left;  . CYSTOSCOPY W/ URETERAL STENT REMOVAL Left 01/25/2019   Procedure: CYSTOSCOPY WITH LEFT URETERAL STENT REMOVAL;  Surgeon: Cleon Gustin, MD;  Location: [facility];  Service: Urology;  Laterality: Left;  . CYSTOSCOPY/URETEROSCOPY/HOLMIUM LASER/STENT PLACEMENT Left 01/06/2019   Procedure: CYSTOSCOPY/URETEROSCOPY//STENT PLACEMENT WITH LEFT RETROGRADE PYELOGRAM;  Surgeon: Cleon Gustin, MD;  Location: [facility];  Service: Urology;  Laterality: Left;  . ESOPHAGEAL DILATION N/A 02/15/2015   Procedure: ESOPHAGEAL DILATION;  Surgeon: Rogene Houston, MD;  [facility];  Service:  Endoscopy;  Laterality: N/A;  . ESOPHAGOGASTRODUODENOSCOPY N/A 02/15/2015   Procedure: ESOPHAGOGASTRODUODENOSCOPY (EGD);  Surgeon: Rogene Houston, MD;  [facility];  Service: Endoscopy;  Laterality: N/A;  2:00  . EXTRACORPOREAL SHOCK WAVE LITHOTRIPSY Left 01/04/2019   Procedure: EXTRACORPOREAL SHOCK WAVE LITHOTRIPSY (ESWL);  Surgeon: Alexis Frock, MD;  Location: [facility];  Service: Urology;  Laterality: Left;  . EYE SURGERY     lasik- bilaterally  . TONSILLECTOMY    . URETEROSCOPY Left 01/25/2019   Procedure: DIAGNOSTIC LEFT URETEROSCOPY;  Surgeon: Cleon Gustin, MD;  Location: [facility];  Service: Urology;  Laterality: Left;    Home Medications:  See Epic Allergies:  See Epic  Family History: Family History  Problem Relation Age of Onset  . Dementia Mother   . Breast cancer Mother   . Coronary artery disease Mother   . Coronary artery disease Father   . Hypertension Father   . Diabetes type II Father   . Hyperlipidemia Father   . GER disease Father     Social History:  reports that he quit smoking about 8 years ago. His smoking use included cigarettes. He smoked 1.50 packs per day. He has never used smokeless tobacco. He reports current alcohol use. He reports that he does not use drugs.  ROS: All other review of systems were reviewed and are negative except what is noted above in HPI  Physical Exam: BP (!) 156/95   Pulse 98   Temp 97.9 F (36.6 C)   Ht 6' (1.829 m)   Wt 250 lb (113.4 kg)   BMI 33.91 kg/m   Constitutional:  Alert  and oriented, No acute distress. HEENT: [place] AT, moist mucus membranes.  Trachea midline, no masses. Cardiovascular: No clubbing, cyanosis, or edema. Respiratory: Normal respiratory effort, no increased work of breathing. GI: Abdomen is soft, nontender, nondistended, no abdominal masses GU: No CVA tenderness Lymph: No cervical or inguinal lymphadenopathy. Skin: No rashes, bruises or suspicious lesions. Neurologic: Grossly  intact, no focal deficits, moving all 4 extremities. Psychiatric: Normal mood and affect.  Laboratory Data: Lab Results  Component Value Date   WBC 14.7 (H) 01/04/2019   HGB 16.0 01/04/2019   HCT 49.0 01/04/2019   MCV 89.3 01/04/2019   PLT 277 01/04/2019    Lab Results  Component Value Date   CREATININE 1.53 (H) 01/05/2019    No results found for: PSA  No results found for: TESTOSTERONE  No results found for: HGBA1C  Urinalysis    Component Value Date/Time   COLORURINE YELLOW 01/05/2019 2056   APPEARANCEUR CLOUDY (A) 01/05/2019 2056   LABSPEC 1.017 01/05/2019 2056   PHURINE 5.0 01/05/2019 2056   GLUCOSEU NEGATIVE 01/05/2019 2056   HGBUR LARGE (A) 01/05/2019 2056   BILIRUBINUR negative 01/06/2019 0900   KETONESUR 20 (A) 01/05/2019 2056   PROTEINUR Positive (A) 01/06/2019 0900   PROTEINUR 30 (A) 01/05/2019 2056   UROBILINOGEN negative (A) 01/06/2019 0900   UROBILINOGEN 0.2 03/27/2010 1655   NITRITE negative 01/06/2019 0900   NITRITE NEGATIVE 01/05/2019 2056   LEUKOCYTESUR Moderate (2+) (A) 01/06/2019 0900   LEUKOCYTESUR SMALL (A) 01/05/2019 2056    Lab Results  Component Value Date   BACTERIA RARE (A) 01/05/2019    Pertinent Imaging:  Results for orders placed during the hospital encounter of 01/04/19  DG Abd 1 View   Narrative CLINICAL DATA:  Left flank pain.  History of left ureteral calculus.  EXAM: ABDOMEN - 1 VIEW  COMPARISON:  CT scan 01/02/2019  FINDINGS: Stable appearing position of the left ureteral calculus located adjacent to the L4 vertebral body. Stable lower pole right renal calculus.  The bowel gas pattern is unremarkable. Scattered air in stool noted in the colon. No free air.  IMPRESSION: 1. Stable mid left ureteral calculus. 2. Right lower pole renal calculus. 3. Unremarkable bowel gas pattern.   Electronically Signed   By: Marijo Sanes M.D.   On: 01/04/2019 06:39    No results found for this or any previous visit. No  results found for this or any previous visit. No results found for this or any previous visit. Results for orders placed during the hospital encounter of 02/26/19  US RENAL   Narrative CLINICAL DATA:  Nephrolithiasis.  Gross hematuria.  EXAM: RENAL / URINARY TRACT ULTRASOUND COMPLETE  COMPARISON:  CT, 01/02/2019  FINDINGS: Right Kidney:  Renal measurements: 13.0 x 7.2 x 7.5 cm = volume: 365.3 mL. Normal parenchymal echogenicity. No masses. Shadowing echogenic stone in the lower pole, 8 mm. No hydronephrosis.  Left Kidney:  Renal measurements: 13.3 x 7.7 x 6.8 cm = volume: 364.8 mL. Normal parenchymal echogenicity. No masses. Shadowing echogenic stone in the lower pole, 10 mm. No hydronephrosis.  Bladder:  Mildly distended. There are calcifications projecting at the bladder base. No mass.  Other:  Enlarged prostate, volume calculated at 288 mL.  IMPRESSION: 1. No acute findings.  No hydronephrosis. 2. Nonobstructing stones in the lower pole of each kidney, on the right present on the prior CT, not seen on the prior CT in the left kidney. 3. Stones project at the bladder base.  No bladder mass.  4. Significant prosthetic enlargement.   Electronically Signed   By: Lajean Manes M.D.   On: 02/26/2019 15:53    No results found for this or any previous visit. No results found for this or any previous visit. Results for orders placed during the hospital encounter of 01/02/19  CT Renal Stone Study   Narrative CLINICAL DATA:  Left flank pain.  EXAM: CT ABDOMEN AND PELVIS WITHOUT CONTRAST  TECHNIQUE: Multidetector CT imaging of the abdomen and pelvis was performed following the standard protocol without IV contrast.  COMPARISON:  03/27/2010  FINDINGS: Lower chest: 10 mm lobular nodule in the left lower lobe is stable compared to chest CT of 08/11/2018. This nodule measured 9 mm on the cardiac CT of 10/17/2017 and was 5 mm on a chest CT of  02/11/2012.  Hepatobiliary: No suspicious focal abnormality within the liver parenchyma. There is no evidence for gallstones, gallbladder wall thickening, or pericholecystic fluid. No intrahepatic or extrahepatic biliary dilation.  Pancreas: No focal mass lesion. No dilatation of the main duct. No intraparenchymal cyst. No peripancreatic edema.  Spleen: No splenomegaly. No focal mass lesion.  Adrenals/Urinary Tract: No adrenal nodule or mass. 5 mm nonobstructing stone noted lower pole right kidney. No right ureteral stone. No secondary changes in the right kidney or ureter. No left renal stones although there is mild left hydronephrosis. 7 x 7 x 4 mm stone identified in the proximal left ureter (axial 46/series 2). No other left urinary stone. 2.1 cm water density lesion lower pole left kidney is compatible with a cyst. No bladder stone.  Stomach/Bowel: Small to moderate hiatal hernia. Duodenum is normally positioned as is the ligament of Treitz. No small bowel wall thickening. No small bowel dilatation. The terminal ileum is normal. The appendix is best seen on coronal images and is unremarkable. No gross colonic mass. No colonic wall thickening. Diverticular changes are noted in the left colon without evidence of diverticulitis.  Vascular/Lymphatic: There is abdominal aortic atherosclerosis without aneurysm. There is no gastrohepatic or hepatoduodenal ligament lymphadenopathy. No retroperitoneal or mesenteric lymphadenopathy. No pelvic sidewall lymphadenopathy.  Reproductive: Prostate gland is markedly enlarged.  Other: No intraperitoneal free fluid.  Musculoskeletal: No worrisome lytic or sclerotic osseous abnormality.  IMPRESSION: 1. 7 x 7 x 4 mm stone identified in the proximal left ureter with mild left hydronephrosis. 2. 5 mm nonobstructing right renal stone. No other urinary stone disease evident on today's study. 3. 10 mm lobular nodule left lower lobe has shown  slow growth comparing back over almost 7 years to a study from 2014. Given the growth characteristics, this is probably benign. Conservatively, follow-up CT chest in 12 months could be used to reassess. 4. Small to moderate hiatal hernia. 5. Marked prostatomegaly. 6.  Aortic Atherosclerois (ICD10-170.0)   Electronically Signed   By: Misty Stanley M.D.   On: 01/02/2019 10:53     Assessment & Plan:    1. Nephrolithiasis -observation, RTC 6 months with renal US  2. Benign prostatic hyperplasia with lower urinary tract symptoms, symptom details unspecified -We discussed treatment options including simple prostatectomy and the patient wishes to proceed with surgery. Risks/benefits/alternatives.   No follow-ups on file.  [name], MD  [facility] [place]

## 2019-03-03 NOTE — Progress Notes (Signed)
Urological Symptom Review  Patient is experiencing the following symptoms: Hard to postpone urination Trouble starting stream Have to strain to urinate Blood in urine Erection problems (male only)   Review of Systems  Gastrointestinal (upper)  : Negative for upper GI symptoms  Gastrointestinal (lower) : Negative for lower GI symptoms  Constitutional : Negative for symptoms  Skin: Negative for skin symptoms  Eyes: Negative for eye symptoms  Ear/Nose/Throat : Negative for Ear/Nose/Throat symptoms  Hematologic/Lymphatic: Negative for Hematologic/Lymphatic symptoms  Cardiovascular : Negative for cardiovascular symptoms  Respiratory : Negative for respiratory symptoms  Endocrine: Negative for endocrine symptoms  Musculoskeletal: Negative for musculoskeletal symptoms  Neurological: Negative for neurological symptoms  Psychologic: Negative for psychiatric symptoms

## 2019-03-04 ENCOUNTER — Other Ambulatory Visit: Payer: Self-pay | Admitting: Urology

## 2019-03-10 ENCOUNTER — Encounter: Payer: Self-pay | Admitting: Urology

## 2019-03-12 ENCOUNTER — Other Ambulatory Visit: Payer: Self-pay | Admitting: Urology

## 2019-03-26 NOTE — Patient Instructions (Addendum)
DUE TO COVID-19 ONLY TWO VISITOR IS ALLOWED TO COME WITH YOU AND STAY IN THE WAITING ROOM ONLY DURING PRE OP AND PROCEDURE DAY OF SURGERY. THE 2 VISITORS MAY VISIT WITH YOU AFTER SURGERY IN YOUR PRIVATE ROOM DURING VISITING HOURS ONLY!  YOU NEED TO HAVE A COVID 19 TEST ON: 04/01/19 @11 :30 am , THIS TEST MUST BE DONE BEFORE SURGERY, COME  [address] [place] , [address].  ([place]) ONCE YOUR COVID TEST IS COMPLETED, PLEASE BEGIN THE QUARANTINE INSTRUCTIONS AS OUTLINED IN YOUR HANDOUT.                [name]     Your procedure is scheduled on: 04/05/19   Report to [facility] Main  Entrance   Report to [place] at: 5:30 AM     Call this number if you have problems the morning of surgery [phone]    Remember: Do not eat food or drink liquids :After Midnight.   BRUSH YOUR TEETH MORNING OF SURGERY AND RINSE YOUR MOUTH OUT, NO CHEWING GUM CANDY OR MINTS.     Take these medicines the morning of surgery with A SIP OF WATER: PANTOPRAZOLE.                                 You may not have any metal on your body including hair pins and              piercings  Do not wear jewelry,lotions, powders or perfumes, deodorant             Men may shave face and neck.   Do not bring valuables to the hospital. [place].  Contacts, dentures or bridgework may not be worn into surgery.  Leave suitcase in the car. After surgery it may be brought to your room.     Patients discharged the day of surgery will not be allowed to drive home. IF YOU ARE HAVING SURGERY AND GOING HOME THE SAME DAY, YOU MUST HAVE AN ADULT TO DRIVE YOU HOME AND BE WITH YOU FOR 24 HOURS. YOU MAY GO HOME BY TAXI OR UBER OR ORTHERWISE, BUT AN ADULT MUST ACCOMPANY YOU HOME AND STAY WITH YOU FOR 24 HOURS.  Name and phone number of your driver:  Special Instructions: N/A              Please read over the following fact sheets you were  given: _____________________________________________________________________    PLEASE BRING CPAP MASK [place]. DEVICE WILL BE PROVIDED!           [place] - Preparing for Surgery Before surgery, you can play an important role.  Because skin is not sterile, your skin needs to be as free of germs as possible.  You can reduce the number of germs on your skin by washing with CHG (chlorahexidine gluconate) soap before surgery.  CHG is an antiseptic cleaner which kills germs and bonds with the skin to continue killing germs even after washing. Please DO NOT use if you have an allergy to CHG or antibacterial soaps.  If your skin becomes reddened/irritated stop using the CHG and inform your nurse when you arrive at Short Stay. Do not shave (including legs and underarms) for at least 48 hours prior to the  first CHG shower.  You may shave your face/neck. Please follow these instructions carefully:  1.  Shower with CHG Soap the night before surgery and the  morning of Surgery.  2.  If you choose to wash your hair, wash your hair first as usual with your  normal  shampoo.  3.  After you shampoo, rinse your hair and body thoroughly to remove the  shampoo.                           4.  Use CHG as you would any other liquid soap.  You can apply chg directly  to the skin and wash                       Gently with a scrungie or clean washcloth.  5.  Apply the CHG Soap to your body ONLY FROM THE NECK DOWN.   Do not use on face/ open                           Wound or open sores. Avoid contact with eyes, ears mouth and genitals (private parts).                       Wash face,  Genitals (private parts) with your normal soap.             6.  Wash thoroughly, paying special attention to the area where your surgery  will be performed.  7.  Thoroughly rinse your body with warm water from the neck down.  8.  DO NOT shower/wash with your normal soap after using and rinsing off  the CHG Soap.                 9.  Pat yourself dry with a clean towel.            10.  Wear clean pajamas.            11.  Place clean sheets on your bed the night of your first shower and do not  sleep with pets. Day of Surgery : Do not apply any lotions/deodorants the morning of surgery.  Please wear clean clothes to the hospital/surgery center.  FAILURE TO FOLLOW THESE INSTRUCTIONS MAY RESULT IN THE CANCELLATION OF YOUR SURGERY PATIENT SIGNATURE_________________________________  NURSE SIGNATURE__________________________________  ________________________________________________________________________

## 2019-03-29 ENCOUNTER — Other Ambulatory Visit: Payer: Self-pay

## 2019-03-29 ENCOUNTER — Encounter (HOSPITAL_COMMUNITY): Payer: Self-pay

## 2019-03-29 ENCOUNTER — Encounter (HOSPITAL_COMMUNITY)
Admission: RE | Admit: 2019-03-29 | Discharge: 2019-03-29 | Disposition: A | Discharge status: Home / Self Care | Payer: PPO | Source: Ambulatory Visit | Attending: Urology | Admitting: Urology

## 2019-03-29 DIAGNOSIS — Z01812 Encounter for preprocedural laboratory examination: Secondary | ICD-10-CM | POA: Insufficient documentation

## 2019-03-29 LAB — CBC
HCT: 47.7 % (ref 39.0–52.0)
Hemoglobin: 15.8 g/dL (ref 13.0–17.0)
MCH: 29.3 pg (ref 26.0–34.0)
MCHC: 33.1 g/dL (ref 30.0–36.0)
MCV: 88.5 fL (ref 80.0–100.0)
Platelets: 275 10*3/uL (ref 150–400)
RBC: 5.39 MIL/uL (ref 4.22–5.81)
RDW: 13 % (ref 11.5–15.5)
WBC: 8.3 10*3/uL (ref 4.0–10.5)
nRBC: 0 % (ref 0.0–0.2)

## 2019-03-29 NOTE — Progress Notes (Signed)
PCP - Dr. Redmond School. LOV: 08/16/18 Cardiologist -  Pulmonologist: Dr. Glynn Octave. LOV: 08/12/18 Chest x-ray -  EKG -  Stress Test -  ECHO -  Cardiac Cath -   Sleep Study -  CPAP -   Fasting Blood Sugar -  Checks Blood Sugar _____ times a day  Blood Thinner Instructions: Aspirin Instructions: Last Dose:  Anesthesia review:   Patient denies shortness of breath, fever, cough and chest pain at PAT appointment   Patient verbalized understanding of instructions that were given to them at the PAT appointment. Patient was also instructed that they will need to review over the PAT instructions again at home before surgery.

## 2019-04-01 ENCOUNTER — Other Ambulatory Visit (HOSPITAL_COMMUNITY)
Admission: RE | Admit: 2019-04-01 | Discharge: 2019-04-01 | Disposition: A | Discharge status: Home / Self Care | Payer: PPO | Source: Ambulatory Visit | Attending: Urology | Admitting: Urology

## 2019-04-01 ENCOUNTER — Other Ambulatory Visit (HOSPITAL_COMMUNITY): Payer: PPO

## 2019-04-01 DIAGNOSIS — Z20822 Contact with and (suspected) exposure to covid-19: Secondary | ICD-10-CM | POA: Diagnosis not present

## 2019-04-01 DIAGNOSIS — Z01812 Encounter for preprocedural laboratory examination: Secondary | ICD-10-CM | POA: Diagnosis not present

## 2019-04-01 LAB — SARS CORONAVIRUS 2 (TAT 6-24 HRS): SARS Coronavirus 2: NEGATIVE

## 2019-04-04 NOTE — Anesthesia Preprocedure Evaluation (Addendum)
Anesthesia Evaluation  Patient identified by MRN, date of birth, ID band Patient awake    Reviewed: Allergy & Precautions, H&P , NPO status , Patient's Chart, lab work & pertinent test results, reviewed documented beta blocker date and time   Airway Mallampati: I  TM Distance: >3 FB Neck ROM: Full    Dental no notable dental hx. (+) Teeth Intact, Dental Advisory Given   Pulmonary sleep apnea , former smoker,    Pulmonary exam normal breath sounds clear to auscultation       Cardiovascular Exercise Tolerance: Good negative cardio ROS Normal cardiovascular exam Rhythm:Regular Rate:Normal  EKG- Incomplete RBBB, LAFB ECHO -  - Left ventricle: The cavity size was normal. Wall thickness was   increased in a pattern of mild LVH. Systolic function was normal.   The estimated ejection fraction was in the range of 60% to 65%.   Wall motion was normal; there were no regional wall motion   abnormalities. Left ventricular diastolic function parameters   were normal for the patient&'s age. - Aortic valve: Mildly calcified annulus. Probably trileaflet;   mildly calcified leaflets. - Mitral valve: Mildly calcified annulus. There was trivial   regurgitation. - Left atrium: The atrium was at the upper limits of normal in   size. - Right atrium: Central venous pressure (est): 3 mm Hg. - Atrial septum: No defect or patent foramen ovale was identified. - Tricuspid valve: There was trivial regurgitation. - Pulmonary arteries: PA peak pressure: 17 mm Hg (S). - Pericardium, extracardiac: There was no pericardial effusion.    Neuro/Psych Anxiety  Neuromuscular disease    GI/Hepatic Neg liver ROS, GERD  Medicated,  Endo/Other  negative endocrine ROSMorbid obesity  Renal/GU Renal InsufficiencyRenal disease  negative genitourinary   Musculoskeletal negative musculoskeletal ROS (+)   Abdominal   Peds negative pediatric ROS (+)   Hematology  (+) Blood dyscrasia, anemia ,   Anesthesia Other Findings   Reproductive/Obstetrics negative OB ROS                            Anesthesia Physical  Anesthesia Plan  ASA: III  Anesthesia Plan: General   Post-op Pain Management:    Induction: Intravenous  PONV Risk Score and Plan: 3 and Midazolam, Ondansetron and Dexamethasone  Airway Management Planned: Oral ETT  Additional Equipment:   Intra-op Plan:   Post-operative Plan: Extubation in OR  Informed Consent: I have reviewed the patients History and Physical, chart, labs and discussed the procedure including the risks, benefits and alternatives for the proposed anesthesia with the patient or authorized representative who has indicated his/her understanding and acceptance.     Dental advisory given  Plan Discussed with: CRNA, Anesthesiologist and Surgeon  Anesthesia Plan Comments:        Anesthesia Quick Evaluation

## 2019-04-05 ENCOUNTER — Other Ambulatory Visit: Payer: Self-pay

## 2019-04-05 ENCOUNTER — Encounter (HOSPITAL_COMMUNITY): Payer: Self-pay | Admitting: Urology

## 2019-04-05 ENCOUNTER — Inpatient Hospital Stay (HOSPITAL_COMMUNITY): Payer: PPO | Admitting: Anesthesiology

## 2019-04-05 ENCOUNTER — Inpatient Hospital Stay (HOSPITAL_COMMUNITY)
Admission: RE | Admit: 2019-04-05 | Discharge: 2019-04-07 | DRG: 708 | Disposition: A | Discharge status: Home / Self Care | Payer: PPO | Attending: Urology | Admitting: Urology

## 2019-04-05 ENCOUNTER — Encounter (HOSPITAL_COMMUNITY)
Admission: RE | Disposition: A | Discharge status: Home / Self Care | Payer: Self-pay | Source: Home / Self Care | Attending: Urology

## 2019-04-05 DIAGNOSIS — Z87891 Personal history of nicotine dependence: Secondary | ICD-10-CM | POA: Diagnosis not present

## 2019-04-05 DIAGNOSIS — N138 Other obstructive and reflux uropathy: Secondary | ICD-10-CM | POA: Diagnosis present

## 2019-04-05 DIAGNOSIS — K219 Gastro-esophageal reflux disease without esophagitis: Secondary | ICD-10-CM | POA: Diagnosis not present

## 2019-04-05 DIAGNOSIS — E785 Hyperlipidemia, unspecified: Secondary | ICD-10-CM | POA: Diagnosis present

## 2019-04-05 DIAGNOSIS — F419 Anxiety disorder, unspecified: Secondary | ICD-10-CM | POA: Diagnosis not present

## 2019-04-05 DIAGNOSIS — N401 Enlarged prostate with lower urinary tract symptoms: Principal | ICD-10-CM | POA: Diagnosis present

## 2019-04-05 DIAGNOSIS — Z8249 Family history of ischemic heart disease and other diseases of the circulatory system: Secondary | ICD-10-CM

## 2019-04-05 DIAGNOSIS — Z20822 Contact with and (suspected) exposure to covid-19: Secondary | ICD-10-CM | POA: Diagnosis not present

## 2019-04-05 DIAGNOSIS — N210 Calculus in bladder: Secondary | ICD-10-CM | POA: Diagnosis not present

## 2019-04-05 DIAGNOSIS — R339 Retention of urine, unspecified: Secondary | ICD-10-CM | POA: Diagnosis present

## 2019-04-05 DIAGNOSIS — Z83438 Family history of other disorder of lipoprotein metabolism and other lipidemia: Secondary | ICD-10-CM | POA: Diagnosis not present

## 2019-04-05 DIAGNOSIS — G4730 Sleep apnea, unspecified: Secondary | ICD-10-CM | POA: Diagnosis present

## 2019-04-05 DIAGNOSIS — Z87442 Personal history of urinary calculi: Secondary | ICD-10-CM | POA: Diagnosis not present

## 2019-04-05 DIAGNOSIS — R3914 Feeling of incomplete bladder emptying: Secondary | ICD-10-CM | POA: Diagnosis not present

## 2019-04-05 DIAGNOSIS — N400 Enlarged prostate without lower urinary tract symptoms: Secondary | ICD-10-CM | POA: Diagnosis not present

## 2019-04-05 HISTORY — PX: XI ROBOTIC ASSISTED SIMPLE PROSTATECTOMY: SHX6713

## 2019-04-05 LAB — TYPE AND SCREEN
ABO/RH(D): O POS
Antibody Screen: NEGATIVE

## 2019-04-05 LAB — ABO/RH: ABO/RH(D): O POS

## 2019-04-05 LAB — HEMOGLOBIN AND HEMATOCRIT, BLOOD
HCT: 48.6 % (ref 39.0–52.0)
Hemoglobin: 15.8 g/dL (ref 13.0–17.0)

## 2019-04-05 SURGERY — PROSTATECTOMY, SIMPLE, ROBOT-ASSISTED
Anesthesia: General

## 2019-04-05 MED ORDER — PANTOPRAZOLE SODIUM 40 MG PO TBEC
40.0000 mg | DELAYED_RELEASE_TABLET | Freq: Every day | ORAL | Status: DC
  Administered 2019-04-06 – 2019-04-07 (×2): 40 mg via ORAL
  Filled 2019-04-05 (×2): qty 1

## 2019-04-05 MED ORDER — SUGAMMADEX SODIUM 200 MG/2ML IV SOLN
INTRAVENOUS | Status: DC | PRN
  Administered 2019-04-05: 300 mg via INTRAVENOUS

## 2019-04-05 MED ORDER — SODIUM CHLORIDE 0.9 % IV BOLUS
1000.0000 mL | Freq: Once | INTRAVENOUS | Status: AC
  Administered 2019-04-05: 1000 mL via INTRAVENOUS

## 2019-04-05 MED ORDER — ONDANSETRON HCL 4 MG/2ML IJ SOLN: 4.0000 mg | Freq: Once | INTRAMUSCULAR | Status: DC | PRN

## 2019-04-05 MED ORDER — CEFAZOLIN SODIUM-DEXTROSE 2-3 GM-%(50ML) IV SOLR
INTRAVENOUS | Status: DC | PRN
  Administered 2019-04-05: 2 g via INTRAVENOUS

## 2019-04-05 MED ORDER — OXYCODONE HCL 5 MG/5ML PO SOLN: 5.0000 mg | Freq: Once | ORAL | Status: DC | PRN

## 2019-04-05 MED ORDER — ROCURONIUM BROMIDE 10 MG/ML (PF) SYRINGE
PREFILLED_SYRINGE | INTRAVENOUS | Status: DC | PRN
  Administered 2019-04-05: 20 mg via INTRAVENOUS
  Administered 2019-04-05 (×2): 50 mg via INTRAVENOUS

## 2019-04-05 MED ORDER — PHENYLEPHRINE HCL (PRESSORS) 10 MG/ML IV SOLN
INTRAVENOUS | Status: AC
  Filled 2019-04-05: qty 1

## 2019-04-05 MED ORDER — ACETAMINOPHEN 325 MG PO TABS: 325.0000 mg | ORAL_TABLET | ORAL | Status: DC | PRN

## 2019-04-05 MED ORDER — BELLADONNA ALKALOIDS-OPIUM 16.2-60 MG RE SUPP
1.0000 | Freq: Four times a day (QID) | RECTAL | Status: DC | PRN
  Administered 2019-04-05: 1 via RECTAL

## 2019-04-05 MED ORDER — OXYCODONE HCL 5 MG PO TABS
5.0000 mg | ORAL_TABLET | ORAL | Status: DC | PRN
  Administered 2019-04-05: 5 mg via ORAL
  Filled 2019-04-05: qty 1

## 2019-04-05 MED ORDER — FENTANYL CITRATE (PF) 100 MCG/2ML IJ SOLN
INTRAMUSCULAR | Status: AC
  Filled 2019-04-05: qty 2

## 2019-04-05 MED ORDER — FENTANYL CITRATE (PF) 250 MCG/5ML IJ SOLN
INTRAMUSCULAR | Status: AC
  Filled 2019-04-05: qty 5

## 2019-04-05 MED ORDER — LABETALOL HCL 5 MG/ML IV SOLN
INTRAVENOUS | Status: DC | PRN
  Administered 2019-04-05: 5 mg via INTRAVENOUS

## 2019-04-05 MED ORDER — ACETAMINOPHEN 160 MG/5ML PO SOLN: 325.0000 mg | ORAL | Status: DC | PRN

## 2019-04-05 MED ORDER — MEPERIDINE HCL 50 MG/ML IJ SOLN: 6.2500 mg | INTRAMUSCULAR | Status: DC | PRN

## 2019-04-05 MED ORDER — MAGNESIUM CITRATE PO SOLN
1.0000 | Freq: Once | ORAL | Status: DC
  Filled 2019-04-05: qty 296

## 2019-04-05 MED ORDER — EPHEDRINE SULFATE-NACL 50-0.9 MG/10ML-% IV SOSY
PREFILLED_SYRINGE | INTRAVENOUS | Status: DC | PRN
  Administered 2019-04-05: 10 mg via INTRAVENOUS
  Administered 2019-04-05 (×2): 5 mg via INTRAVENOUS

## 2019-04-05 MED ORDER — BUPIVACAINE LIPOSOME 1.3 % IJ SUSP
20.0000 mL | Freq: Once | INTRAMUSCULAR | Status: AC
  Administered 2019-04-05: 20 mL
  Filled 2019-04-05: qty 20

## 2019-04-05 MED ORDER — ONDANSETRON HCL 4 MG/2ML IJ SOLN: 4.0000 mg | INTRAMUSCULAR | Status: DC | PRN

## 2019-04-05 MED ORDER — BELLADONNA ALKALOIDS-OPIUM 16.2-60 MG RE SUPP
RECTAL | Status: AC
  Filled 2019-04-05: qty 1

## 2019-04-05 MED ORDER — BACITRACIN-NEOMYCIN-POLYMYXIN 400-5-5000 EX OINT: 1.0000 "application " | TOPICAL_OINTMENT | Freq: Three times a day (TID) | CUTANEOUS | Status: DC | PRN

## 2019-04-05 MED ORDER — ALPRAZOLAM 0.5 MG PO TABS: 0.5000 mg | ORAL_TABLET | Freq: Three times a day (TID) | ORAL | Status: DC | PRN

## 2019-04-05 MED ORDER — SULFAMETHOXAZOLE-TRIMETHOPRIM 800-160 MG PO TABS: 1.0000 | ORAL_TABLET | Freq: Two times a day (BID) | ORAL | 0 refills | Status: DC

## 2019-04-05 MED ORDER — ONDANSETRON HCL 4 MG/2ML IJ SOLN
INTRAMUSCULAR | Status: DC | PRN
  Administered 2019-04-05: 4 mg via INTRAVENOUS

## 2019-04-05 MED ORDER — LACTATED RINGERS IV SOLN: INTRAVENOUS | Status: DC

## 2019-04-05 MED ORDER — LACTATED RINGERS IR SOLN
Status: DC | PRN
  Administered 2019-04-05: 1000 mL

## 2019-04-05 MED ORDER — PHENYLEPHRINE HCL-NACL 10-0.9 MG/250ML-% IV SOLN
INTRAVENOUS | Status: DC | PRN
  Administered 2019-04-05: 50 ug/min via INTRAVENOUS

## 2019-04-05 MED ORDER — PROPOFOL 10 MG/ML IV BOLUS
INTRAVENOUS | Status: DC | PRN
  Administered 2019-04-05: 150 mg via INTRAVENOUS

## 2019-04-05 MED ORDER — MIDAZOLAM HCL 5 MG/5ML IJ SOLN
INTRAMUSCULAR | Status: DC | PRN
  Administered 2019-04-05: 2 mg via INTRAVENOUS

## 2019-04-05 MED ORDER — SUGAMMADEX SODIUM 500 MG/5ML IV SOLN
INTRAVENOUS | Status: AC
  Filled 2019-04-05: qty 5

## 2019-04-05 MED ORDER — MIDAZOLAM HCL 2 MG/2ML IJ SOLN
INTRAMUSCULAR | Status: AC
  Filled 2019-04-05: qty 2

## 2019-04-05 MED ORDER — ONDANSETRON HCL 4 MG/2ML IJ SOLN
INTRAMUSCULAR | Status: AC
  Filled 2019-04-05: qty 2

## 2019-04-05 MED ORDER — DIPHENHYDRAMINE HCL 50 MG/ML IJ SOLN: 12.5000 mg | Freq: Four times a day (QID) | INTRAMUSCULAR | Status: DC | PRN

## 2019-04-05 MED ORDER — DEXAMETHASONE SODIUM PHOSPHATE 10 MG/ML IJ SOLN
INTRAMUSCULAR | Status: AC
  Filled 2019-04-05: qty 1

## 2019-04-05 MED ORDER — HYDROCODONE-ACETAMINOPHEN 5-325 MG PO TABS: 1.0000 | ORAL_TABLET | Freq: Four times a day (QID) | ORAL | 0 refills | Status: DC | PRN

## 2019-04-05 MED ORDER — ACETAMINOPHEN 325 MG PO TABS: 650.0000 mg | ORAL_TABLET | ORAL | Status: DC | PRN

## 2019-04-05 MED ORDER — DOCUSATE SODIUM 100 MG PO CAPS
100.0000 mg | ORAL_CAPSULE | Freq: Two times a day (BID) | ORAL | Status: DC
  Administered 2019-04-05 – 2019-04-07 (×5): 100 mg via ORAL
  Filled 2019-04-05 (×5): qty 1

## 2019-04-05 MED ORDER — ATORVASTATIN CALCIUM 20 MG PO TABS
20.0000 mg | ORAL_TABLET | Freq: Every day | ORAL | Status: DC
  Administered 2019-04-05 – 2019-04-07 (×3): 20 mg via ORAL
  Filled 2019-04-05 (×3): qty 1

## 2019-04-05 MED ORDER — HYDROMORPHONE HCL 1 MG/ML IJ SOLN
INTRAMUSCULAR | Status: AC
  Filled 2019-04-05: qty 1

## 2019-04-05 MED ORDER — CEFAZOLIN SODIUM-DEXTROSE 2-4 GM/100ML-% IV SOLN
INTRAVENOUS | Status: AC
  Filled 2019-04-05: qty 100

## 2019-04-05 MED ORDER — DEXAMETHASONE SODIUM PHOSPHATE 10 MG/ML IJ SOLN
INTRAMUSCULAR | Status: DC | PRN
  Administered 2019-04-05: 8 mg via INTRAVENOUS

## 2019-04-05 MED ORDER — LIDOCAINE 2% (20 MG/ML) 5 ML SYRINGE
INTRAMUSCULAR | Status: DC | PRN
  Administered 2019-04-05: 100 mg via INTRAVENOUS

## 2019-04-05 MED ORDER — LIDOCAINE 2% (20 MG/ML) 5 ML SYRINGE
INTRAMUSCULAR | Status: AC
  Filled 2019-04-05: qty 5

## 2019-04-05 MED ORDER — STERILE WATER FOR IRRIGATION IR SOLN
Status: DC | PRN
  Administered 2019-04-05: 1000 mL

## 2019-04-05 MED ORDER — SODIUM CHLORIDE (PF) 0.9 % IJ SOLN
INTRAMUSCULAR | Status: DC | PRN
  Administered 2019-04-05: 20 mL via INTRAVENOUS

## 2019-04-05 MED ORDER — SODIUM CHLORIDE 0.9 % IR SOLN
3000.0000 mL | Status: DC
  Administered 2019-04-05: 3000 mL

## 2019-04-05 MED ORDER — FENTANYL CITRATE (PF) 250 MCG/5ML IJ SOLN
INTRAMUSCULAR | Status: DC | PRN
  Administered 2019-04-05 (×4): 50 ug via INTRAVENOUS
  Administered 2019-04-05: 100 ug via INTRAVENOUS
  Administered 2019-04-05: 50 ug via INTRAVENOUS

## 2019-04-05 MED ORDER — DIPHENHYDRAMINE HCL 12.5 MG/5ML PO ELIX: 12.5000 mg | ORAL_SOLUTION | Freq: Four times a day (QID) | ORAL | Status: DC | PRN

## 2019-04-05 MED ORDER — PROPOFOL 10 MG/ML IV BOLUS
INTRAVENOUS | Status: AC
  Filled 2019-04-05: qty 20

## 2019-04-05 MED ORDER — SODIUM CHLORIDE (PF) 0.9 % IJ SOLN
INTRAMUSCULAR | Status: AC
  Filled 2019-04-05: qty 20

## 2019-04-05 MED ORDER — OXYCODONE HCL 5 MG PO TABS: 5.0000 mg | ORAL_TABLET | Freq: Once | ORAL | Status: DC | PRN

## 2019-04-05 MED ORDER — LACTATED RINGERS IV SOLN: INTRAVENOUS | Status: DC | PRN

## 2019-04-05 MED ORDER — HYDROMORPHONE HCL 1 MG/ML IJ SOLN
0.2500 mg | INTRAMUSCULAR | Status: DC | PRN
  Administered 2019-04-05 (×2): 0.5 mg via INTRAVENOUS

## 2019-04-05 MED ORDER — CHLORHEXIDINE GLUCONATE CLOTH 2 % EX PADS
6.0000 | MEDICATED_PAD | Freq: Every day | CUTANEOUS | Status: DC
  Administered 2019-04-06 – 2019-04-07 (×2): 6 via TOPICAL

## 2019-04-05 MED ORDER — FENTANYL CITRATE (PF) 100 MCG/2ML IJ SOLN
25.0000 ug | INTRAMUSCULAR | Status: DC | PRN
  Administered 2019-04-05 (×2): 50 ug via INTRAVENOUS

## 2019-04-05 MED ORDER — HYDROMORPHONE HCL 1 MG/ML IJ SOLN: 0.5000 mg | INTRAMUSCULAR | Status: DC | PRN

## 2019-04-05 MED ORDER — ROCURONIUM BROMIDE 10 MG/ML (PF) SYRINGE
PREFILLED_SYRINGE | INTRAVENOUS | Status: AC
  Filled 2019-04-05: qty 10

## 2019-04-05 MED ORDER — PHENYLEPHRINE 40 MCG/ML (10ML) SYRINGE FOR IV PUSH (FOR BLOOD PRESSURE SUPPORT)
PREFILLED_SYRINGE | INTRAVENOUS | Status: DC | PRN
  Administered 2019-04-05: 80 ug via INTRAVENOUS

## 2019-04-05 MED ORDER — DEXTROSE-NACL 5-0.45 % IV SOLN: INTRAVENOUS | Status: DC

## 2019-04-05 SURGICAL SUPPLY — 65 items
ADH SKN CLS APL DERMABOND .7 (GAUZE/BANDAGES/DRESSINGS) ×1
APL PRP STRL LF DISP 70% ISPRP (MISCELLANEOUS) ×1
APL SWBSTK 6 STRL LF DISP (MISCELLANEOUS) ×1
APPLICATOR COTTON TIP 6 STRL (MISCELLANEOUS) ×1 IMPLANT
APPLICATOR COTTON TIP 6IN STRL (MISCELLANEOUS) ×2
CATH FOLEY 2WAY SLVR  5CC 18FR (CATHETERS) ×2
CATH FOLEY 2WAY SLVR 5CC 18FR (CATHETERS) ×1 IMPLANT
CATH FOLEY 3WAY 30CC 22FR (CATHETERS) ×2 IMPLANT
CHLORAPREP W/TINT 26 (MISCELLANEOUS) ×2 IMPLANT
CLOTH BEACON ORANGE TIMEOUT ST (SAFETY) ×2 IMPLANT
COVER SURGICAL LIGHT HANDLE (MISCELLANEOUS) ×2 IMPLANT
COVER TIP SHEARS 8 DVNC (MISCELLANEOUS) ×1 IMPLANT
COVER TIP SHEARS 8MM DA VINCI (MISCELLANEOUS) ×2
COVER WAND RF STERILE (DRAPES) IMPLANT
DECANTER SPIKE VIAL GLASS SM (MISCELLANEOUS) ×2 IMPLANT
DERMABOND ADVANCED (GAUZE/BANDAGES/DRESSINGS) ×1
DERMABOND ADVANCED .7 DNX12 (GAUZE/BANDAGES/DRESSINGS) ×1 IMPLANT
DRAIN CHANNEL RND F F (WOUND CARE) IMPLANT
DRAPE ARM DVNC X/XI (DISPOSABLE) ×4 IMPLANT
DRAPE COLUMN DVNC XI (DISPOSABLE) ×1 IMPLANT
DRAPE DA VINCI XI ARM (DISPOSABLE) ×8
DRAPE DA VINCI XI COLUMN (DISPOSABLE) ×2
DRAPE SURG IRRIG POUCH 19X23 (DRAPES) ×2 IMPLANT
DRSG TEGADERM 4X4.75 (GAUZE/BANDAGES/DRESSINGS) ×2 IMPLANT
DRSG TEGADERM 6X8 (GAUZE/BANDAGES/DRESSINGS) ×1 IMPLANT
ELECT REM PT RETURN 15FT ADLT (MISCELLANEOUS) ×2 IMPLANT
GAUZE SPONGE 2X2 8PLY STRL LF (GAUZE/BANDAGES/DRESSINGS) ×1 IMPLANT
GAUZE SPONGE 4X4 12PLY STRL (GAUZE/BANDAGES/DRESSINGS) ×1 IMPLANT
GLOVE BIO SURGEON STRL SZ 6.5 (GLOVE) ×2 IMPLANT
GLOVE BIO SURGEON STRL SZ8 (GLOVE) ×4 IMPLANT
GLOVE BIOGEL PI IND STRL 8 (GLOVE) ×2 IMPLANT
GLOVE BIOGEL PI INDICATOR 8 (GLOVE) ×2
GOWN STRL REUS W/TWL LRG LVL3 (GOWN DISPOSABLE) ×6 IMPLANT
HOLDER FOLEY CATH W/STRAP (MISCELLANEOUS) ×2 IMPLANT
IRRIG SUCT STRYKERFLOW 2 WTIP (MISCELLANEOUS) ×2
IRRIGATION SUCT STRKRFLW 2 WTP (MISCELLANEOUS) ×1 IMPLANT
IV LACTATED RINGERS 1000ML (IV SOLUTION) ×2 IMPLANT
KIT TURNOVER KIT A (KITS) IMPLANT
NDL INSUFFLATION 14GA 120MM (NEEDLE) ×1 IMPLANT
NEEDLE INSUFFLATION 14GA 120MM (NEEDLE) ×2 IMPLANT
PACK ROBOT UROLOGY CUSTOM (CUSTOM PROCEDURE TRAY) ×2 IMPLANT
PAD POSITIONING PINK XL (MISCELLANEOUS) ×2 IMPLANT
PENCIL SMOKE EVACUATOR (MISCELLANEOUS) IMPLANT
SEAL CANN UNIV 5-8 DVNC XI (MISCELLANEOUS) ×4 IMPLANT
SEAL XI 5MM-8MM UNIVERSAL (MISCELLANEOUS) ×8
SET IRRIG Y TYPE TUR BLADDER L (SET/KITS/TRAYS/PACK) IMPLANT
SET TUBE SMOKE EVAC HIGH FLOW (TUBING) ×2 IMPLANT
SOLUTION ELECTROLUBE (MISCELLANEOUS) ×2 IMPLANT
SPONGE GAUZE 2X2 STER 10/PKG (GAUZE/BANDAGES/DRESSINGS) ×1
SPONGE LAP 4X18 RFD (DISPOSABLE) IMPLANT
SUT ETHILON 3 0 PS 1 (SUTURE) ×2 IMPLANT
SUT MNCRL AB 4-0 PS2 18 (SUTURE) ×4 IMPLANT
SUT PDS AB 0 CT1 36 (SUTURE) ×1 IMPLANT
SUT V-LOC BARB 180 2/0GR6 GS22 (SUTURE) ×4
SUT VIC AB 0 CT1 27 (SUTURE) ×16
SUT VIC AB 0 CT1 27XBRD ANTBC (SUTURE) ×8 IMPLANT
SUT VIC AB 2-0 SH 27 (SUTURE)
SUT VIC AB 2-0 SH 27X BRD (SUTURE) IMPLANT
SUT VICRYL 0 UR6 27IN ABS (SUTURE) ×4 IMPLANT
SUT VLOC BARB 180 ABS3/0GR12 (SUTURE)
SUTURE V-LC BRB 180 2/0GR6GS22 (SUTURE) ×2 IMPLANT
SUTURE VLOC BRB 180 ABS3/0GR12 (SUTURE) IMPLANT
TOWEL OR NON WOVEN STRL DISP B (DISPOSABLE) ×2 IMPLANT
TROCAR XCEL NON-BLD 5MMX100MML (ENDOMECHANICALS) IMPLANT
WATER STERILE IRR 1000ML POUR (IV SOLUTION) ×2 IMPLANT

## 2019-04-05 NOTE — Transfer of Care (Signed)
Immediate Anesthesia Transfer of Care Note  Patient: James Norris  Procedure(s) Performed: XI ROBOTIC ASSISTED SIMPLE PROSTATECTOMY (N/A )  Patient Location: PACU  Anesthesia Type:General  Level of Consciousness: awake, alert , oriented and patient cooperative  Airway & Oxygen Therapy: Patient Spontanous Breathing and Patient connected to face mask oxygen  Post-op Assessment: Report given to RN, Post -op Vital signs reviewed and stable and Patient moving all extremities  Post vital signs: Reviewed and stable  Last Vitals:  Vitals Value Taken Time  BP 122/97 04/05/19 1100  Temp    Pulse 74 04/05/19 1103  Resp 23 04/05/19 1103  SpO2 100 % 04/05/19 1103  Vitals shown include unvalidated device data.  Last Pain:  Vitals:   04/05/19 0605  TempSrc: Oral  PainSc: 0-No pain         Complications: No apparent anesthesia complications

## 2019-04-05 NOTE — Op Note (Signed)
PREOPERATIVE DIAGNOSIS: BPH with incomplete bladder emptying  POSTOPERATIVE DIAGNOSIS:  BPH with incomplete bladder emptying, bladder calculus   PROCEDURES: 1. Robotic-assisted laparoscopic simple prostatectomy. 2. cystolithotomy for a stone under 2.5cm  ANESTHESIA: General  ATTENDING: [name], MD  RESIDENT: Kerrie Pleasure, MD  ASSISTANT: Clemetine Marker, PA   ESTIMATED BLOOD LOSS: 100 mL.  COMPLICATIONS: None.  SPECIMEN: 1.prostatic adenoma 2. Bladder calculus  ANTIBIOTICS: ancef  FINDINGS: 2cm intravesical prostatic protrusion. Ureteral orifices in normal anatomic location. No leaks from cystotomy at 150cc of water. 54mm bladder calculus. The assistant was utilized for retraction, suction, passing suture, and retrieving the specimen  DRAINS: 1. Jackson-Pratt drain to bulb suction. 2. Foley catheter to straight drain.  INDICATION: James Norris is a very pleasant 69 year old gentleman, who has BPH with significant LUTS including elevated PVR. His TRUS volume is 288cc.  Options were discussed with the patient in detail for primary manage including continued surveillance protocols versus surgical extirpation with and without minimally invasive assistance and he wished to proceed with robotic simple prostatectomy. Informed consent was obtained and placed in the medical record.  PROCEDURE IN DETAIL: The patient was brought to the operating room and a breif timeout was down to ensure correct patient, correct procedure, and correct site. Intravenous antibiotics were administered. General endotracheal anesthesia was introduced. The patient was placed into a low lithotomy position after tucking his arms with foam padding, placing on a pink and non-slide foam pad. A test of steep Trendelenburg positioning was performed and he was found to be suitably positioned. Sterile field was created by prepping and draping the patient's penis, perineum and proximal thighs  using iodine and his infra-xiphoid abdomen using chlorhexidine gluconate. Next, a high-flow, low-pressure pneumoperitoneum was obtained using Veress technique in the infraumbilical midline having passed the aspiration and drop test. Next, a 8-mm robotic camera port was placed in the same location. Laparoscopic examination of the peritoneal cavity revealed no significant adhesions and no visceral injury. Additional ports were placed as follows: Right paramedian 8-mm robotic port, right far lateral 12-mm assistant port, left paramedian 8-mm robotic port, left far lateral 8-mm robotic port, and right paramedian 5-mm suction port. Robot was docked and passed through the electronic checks. Next, attention was directed for the development of space of Retzius. Incision was made lateral to the left medial umbilical ligament from the midline towards the area of the internal ring and coursing along the iliac vessels towards the area of the ureter, which was positively identified. The left bladder was dissected away from the pelvic sidewall towards the area of the endopelvic fascia. A mirror-imaged dissection was performed on the right side.  Additional anterior attachments were taken down using cautery scissors. Next, the bladder neck was identified moving the Foley catheter back and forth. We then made a 6cm transverse cystotomy 3cm from the bladder neck. We identified the ureteral orifices and care was taken to exclude then from the dissection. We then placed 3 holding stitches in the anterior bladder wall and secured it to Coopers ligament.  We then made a circumscribing incision around the base of the prostate. We then used a 0 vicryl in a figure of eight fashion in the base of the adenoma for traction. We proceeded with a posterior dissection of the prostate until we identified the capsule. We then used a combination of electrocautery, blunt and sharp dissection to free the adenoma from the  capsule. We then dissected laterally to the apex. Individual bleeders were cauterized. We then dissected  anteriorly along the capsule until we reached the apex. The adenoma was then freed and placed in an endocatch bag. We noted good hemostasis and no additional sutures were placed. A Foley catheter was then placed per urethra easily. We then tacked down the bladder neck to the prostatic fossa with a single interrupted 2-0 vicryl. We then proceeded to closed the cystotomy. We closed the bladder with a running 0 vicryl full thickness. We then performed a imbricating second layer  Closure with 0 vicryl. The bladder was then filled with 150cc of water and we noted no leak.  All sponge and needle counts were correct. A closed suction drain was brought to the previous left lateral robotic port site into the area of the peritoneal cavity. The previous right 12-mm assistant port was closed at the level of the fascia using a Carter-Thomason suture passer under laparoscopic vision. Robot was undocked. Specimen was retrieved by extending the previous camera port site inferiorly for distance approximately 3 cm and removing the prostatectomy specimens and setting aside for permanent pathology. The site was closed at the level of fascia using figure-of-eight 0 vicryl followed by reapproximation of the Scarpa's using running Vicryl. All incision sites were infiltrated with dilute Lyophilized Marcaine and closed at the level of the skin using subcuticular Monocryl followed by Dermabond. Procedure was then terminated. The patient tolerated the procedure well. There were no immediate periprocedural complications and the patient was taken to the postanesthesia care unit in stable Condition.  COMPLICATIONS: None  CONDITION: Stable, extubated, transferred to PACU  PLAN: The patient will be admitted for 1-2 for hydration, post operative monitoring and pain control. He will be discharged home with foley in  place and foley will be removed in 14 days, He will have a cystogram prior to foley catheter removal

## 2019-04-05 NOTE — H&P (Signed)
Urology Admission H&P  Chief Complaint: incomplete bladder emptying  History of Present Illness: HPI: Mr Urvina is a 69yo with a hx of nephrolithiasis and BPH. He has severe LUTS on alpha blocker therapy. Prostate on [place] is 230ml. He had an episode of gross hematuria last month. He also has bladder calculi on bladder US. He has failed medical therapy  Past Medical History:  Diagnosis Date  . Anxiety   . GERD (gastroesophageal reflux disease)   . Hyperlipemia   . Kidney stone   . Nephrolithiasis 01/06/2019  . Sleep apnea    cpap   Past Surgical History:  Procedure Laterality Date  . BALLOON DILATION N/A 03/11/2012   Procedure: BALLOON DILATION;  Surgeon: Rogene Houston, MD;  [facility];  Service: Endoscopy;  Laterality: N/A;  . COLONOSCOPY WITH ESOPHAGOGASTRODUODENOSCOPY (EGD) N/A 03/11/2012   Procedure: COLONOSCOPY WITH ESOPHAGOGASTRODUODENOSCOPY (EGD);  Surgeon: Rogene Houston, MD;  Location: [facility];  Service: Endoscopy;  Laterality: N/A;  1200  . CYSTOSCOPY W/ RETROGRADES Left 01/25/2019   Procedure: CYSTOSCOPY WITH LEFT RETROGRADE PYELOGRAM;  Surgeon: Cleon Gustin, MD;  Location: [facility];  Service: Urology;  Laterality: Left;  . CYSTOSCOPY W/ URETERAL STENT REMOVAL Left 01/25/2019   Procedure: CYSTOSCOPY WITH LEFT URETERAL STENT REMOVAL;  Surgeon: Cleon Gustin, MD;  Location: [facility];  Service: Urology;  Laterality: Left;  . CYSTOSCOPY/URETEROSCOPY/HOLMIUM LASER/STENT PLACEMENT Left 01/06/2019   Procedure: CYSTOSCOPY/URETEROSCOPY//STENT PLACEMENT WITH LEFT RETROGRADE PYELOGRAM;  Surgeon: Cleon Gustin, MD;  Location: [facility];  Service: Urology;  Laterality: Left;  . ESOPHAGEAL DILATION N/A 02/15/2015   Procedure: ESOPHAGEAL DILATION;  Surgeon: Rogene Houston, MD;  [facility];  Service: Endoscopy;  Laterality: N/A;  . ESOPHAGOGASTRODUODENOSCOPY N/A 02/15/2015   Procedure: ESOPHAGOGASTRODUODENOSCOPY (EGD);  Surgeon: Rogene Houston, MD;   [facility];  Service: Endoscopy;  Laterality: N/A;  2:00  . EXTRACORPOREAL SHOCK WAVE LITHOTRIPSY Left 01/04/2019   Procedure: EXTRACORPOREAL SHOCK WAVE LITHOTRIPSY (ESWL);  Surgeon: Alexis Frock, MD;  Location: [facility];  Service: Urology;  Laterality: Left;  . EYE SURGERY     lasik- bilaterally  . TONSILLECTOMY    . URETEROSCOPY Left 01/25/2019   Procedure: DIAGNOSTIC LEFT URETEROSCOPY;  Surgeon: Cleon Gustin, MD;  Location: [facility];  Service: Urology;  Laterality: Left;    Home Medications:  Current Facility-Administered Medications  Medication Dose Route Frequency Provider Last Rate Last Admin  . bupivacaine liposome (EXPAREL) 1.3 % injection 266 mg  20 mL Infiltration Once Cleon Gustin, MD      . ceFAZolin (ANCEF) 2-4 GM/100ML-% IVPB           . lactated ringers infusion   Intravenous Continuous Belinda Block, MD 100 mL/hr at 04/05/19 0629 New Bag at 04/05/19 [id number]  . magnesium citrate solution 1 Bottle  1 Bottle Oral Once Alexis Frock, MD       Facility-Administered Medications Ordered in Other Encounters  Medication Dose Route Frequency Provider Last Rate Last Admin  . lactated ringers infusion   Intravenous Continuous PRN Mitzie Na, CRNA   New Bag at 04/05/19 [id number]   Allergies: No Known Allergies  Family History  Problem Relation Age of Onset  . Dementia Mother   . Breast cancer Mother   . Coronary artery disease Mother   . Coronary artery disease Father   . Hypertension Father   . Diabetes type II Father   . Hyperlipidemia Father   . GER disease Father  Social History:  reports that he quit smoking about 8 years ago. His smoking use included cigarettes. He smoked 1.50 packs per day. He has never used smokeless tobacco. He reports current alcohol use. He reports that he does not use drugs.  Review of Systems  Genitourinary: Positive for difficulty urinating.  All other systems reviewed and are negative.   Physical Exam:  Vital  signs in last 24 hours: Temp:  [98.5 F (36.9 C)] 98.5 F (36.9 C) (03/29 0605) Pulse Rate:  [67] 67 (03/29 0605) Resp:  [16] 16 (03/29 0605) BP: (154)/(91) 154/91 (03/29 0605) SpO2:  [96 %] 96 % (03/29 [id number]) Physical Exam  Constitutional: He is oriented to person, place, and time. He appears well-developed and well-nourished.  HENT:  Head: Normocephalic and atraumatic.  Eyes: Pupils are equal, round, and reactive to light. EOM are normal.  Neck: No thyromegaly present.  Cardiovascular: Normal rate and regular rhythm.  Respiratory: Effort normal. No respiratory distress.  GI: Soft. He exhibits no distension.  Musculoskeletal:        General: No edema. Normal range of motion.     Cervical back: Normal range of motion.  Neurological: He is alert and oriented to person, place, and time.  Skin: Skin is warm and dry.  Psychiatric: He has a normal mood and affect. His behavior is normal. Judgment and thought content normal.    Laboratory Data:  No results found for this or any previous visit (from the past 24 hour(s)). Recent Results (from the past 240 hour(s))  SARS CORONAVIRUS 2 (TAT 6-24 HRS) Nasopharyngeal Nasopharyngeal Swab     Status: None   Collection Time: 04/01/19 12:16 PM   Specimen: Nasopharyngeal Swab  Result Value Ref Range Status   SARS Coronavirus 2 NEGATIVE NEGATIVE Final    Comment: (NOTE) SARS-CoV-2 target nucleic acids are NOT DETECTED. The SARS-CoV-2 RNA is generally detectable in upper and lower respiratory specimens during the acute phase of infection. Negative results do not preclude SARS-CoV-2 infection, do not rule out co-infections with other pathogens, and should not be used as the sole basis for treatment or other patient management decisions. Negative results must be combined with clinical observations, patient history, and epidemiological information. The expected result is Negative. Fact Sheet for  Patients: SugarRoll.be Fact Sheet for Healthcare Providers: https://www.woods-mathews.com/ This test is not yet approved or cleared by the [place] FDA and  has been authorized for detection and/or diagnosis of SARS-CoV-2 by FDA under an Emergency Use Authorization (EUA). This EUA will remain  in effect (meaning this test can be used) for the duration of the COVID-19 declaration under Section 56 4(b)(1) of the Act, 21 U.S.C. section 360bbb-3(b)(1), unless the authorization is terminated or revoked sooner. Performed at [facility], [place] [address] [place], [place] [address]    Creatinine: No results for input(s): CREATININE in the last 168 hours. Baseline Creatinine: unknown  Impression/Assessment:  68yo with BPH with incomplete emptying  Plan:   The risks/benefits/alterantives to robotic simple prostatectomy was explained to the patient and he understands and wishes to proceed with surgery  [name] 04/05/2019, 7:38 AM

## 2019-04-05 NOTE — Anesthesia Procedure Notes (Signed)
Procedure Name: Intubation Date/Time: 04/05/2019 7:54 AM Performed by: Mitzie Na, CRNA Pre-anesthesia Checklist: Patient identified, Emergency Drugs available, Suction available and Patient being monitored Patient Re-evaluated:Patient Re-evaluated prior to induction Oxygen Delivery Method: Circle system utilized Preoxygenation: Pre-oxygenation with 100% oxygen Induction Type: IV induction Ventilation: Mask ventilation without difficulty Laryngoscope Size: Mac and 4 Grade View: Grade I Tube type: Oral Number of attempts: 1 Airway Equipment and Method: Stylet and Oral airway Placement Confirmation: ETT inserted through vocal cords under direct vision,  positive ETCO2 and breath sounds checked- equal and bilateral Tube secured with: Tape Dental Injury: Teeth and Oropharynx as per pre-operative assessment  Comments: Intubated by C. Devenger, SRNA

## 2019-04-05 NOTE — Discharge Instructions (Signed)

## 2019-04-05 NOTE — Anesthesia Postprocedure Evaluation (Signed)
Anesthesia Post Note  Patient: James Norris  Procedure(s) Performed: XI ROBOTIC ASSISTED SIMPLE PROSTATECTOMY (N/A )     Patient location during evaluation: PACU Anesthesia Type: General Level of consciousness: awake and alert Pain management: pain level controlled Vital Signs Assessment: post-procedure vital signs reviewed and stable Respiratory status: spontaneous breathing, nonlabored ventilation, respiratory function stable and patient connected to nasal cannula oxygen Cardiovascular status: blood pressure returned to baseline and stable Postop Assessment: no apparent nausea or vomiting Anesthetic complications: no    Last Vitals:  Vitals:   04/05/19 1145 04/05/19 1200  BP: (!) 179/110 (!) 166/96  Pulse: 76 78  Resp: (!) 9 18  Temp:    SpO2: 98% 98%    Last Pain:  Vitals:   04/05/19 1145  TempSrc:   PainSc: 6                  Ruhee Enck

## 2019-04-06 LAB — BASIC METABOLIC PANEL
Anion gap: 7 (ref 5–15)
BUN: 11 mg/dL (ref 8–23)
CO2: 27 mmol/L (ref 22–32)
Calcium: 8.8 mg/dL — ABNORMAL LOW (ref 8.9–10.3)
Chloride: 105 mmol/L (ref 98–111)
Creatinine, Ser: 0.89 mg/dL (ref 0.61–1.24)
GFR calc Af Amer: >60 mL/min (ref >60)
GFR calc non Af Amer: >60 mL/min (ref >60)
Glucose, Bld: 140 mg/dL — ABNORMAL HIGH (ref 70–99)
Potassium: 4.6 mmol/L (ref 3.5–5.1)
Sodium: 139 mmol/L (ref 135–145)

## 2019-04-06 LAB — HEMOGLOBIN AND HEMATOCRIT, BLOOD
HCT: 48.3 % (ref 39.0–52.0)
Hemoglobin: 15.7 g/dL (ref 13.0–17.0)

## 2019-04-06 LAB — SURGICAL PATHOLOGY

## 2019-04-06 NOTE — Progress Notes (Addendum)
1 Day Post-Op Subjective: The patient is doing well.  No nausea or vomiting. Pain is adequately controlled.  Objective: Vital signs in last 24 hours: Temp:  [97.5 F (36.4 C)-98.6 F (37 C)] 98.6 F (37 C) (03/30 0415) Pulse Rate:  [69-93] 77 (03/30 0415) Resp:  [9-20] 18 (03/30 0415) BP: (122-179)/(85-110) 152/85 (03/30 0415) SpO2:  [95 %-100 %] 98 % (03/30 0415) Weight:  [120.2 kg] 120.2 kg (03/29 1254)  Intake/Output from previous day: 03/29 0701 - 03/30 0700 In: 13847.4 [P.O.:360; I.V.:2737.4; IV Piggyback:1050] Out: [id number] [Urine:12750; Blood:100] Intake/Output this shift: No intake/output data recorded.  Physical Exam:  General: Alert and oriented. CV: Regular rate Lungs: stable, on home CPAP overnight GI: Soft, appropriately tender Incisions: Clean, dry, and intact Urine: Clear pink in tubing, no clots Extremities: Nontender, no erythema, no edema.  Lab Results: Recent Labs    04/05/19 1109 04/06/19 0525  HGB 15.8 15.7  HCT 48.6 48.3      Assessment/Plan: POD# 1 s/p robotic simple prostatectomy for BPH.   1) Advance diet as tolerated.  2) Ambulate, Incentive spirometry 3) Wean CBI 4) Continue pelvic drain today  Likely discharge tomorrow.    LOS: 1 day   Haskel Schroeder 04/06/2019, 7:26 AM

## 2019-04-07 NOTE — Progress Notes (Signed)
2 Days Post-Op Subjective: The patient is doing well.  No nausea or vomiting. Pain is adequately controlled.  Objective: Vital signs in last 24 hours: Temp:  [98.2 F (36.8 C)-98.6 F (37 C)] 98.6 F (37 C) (03/31 0648) Pulse Rate:  [79-87] 79 (03/31 0648) Resp:  [18-20] 18 (03/31 0648) BP: (152-160)/(77-87) 152/80 (03/31 0648) SpO2:  [94 %-98 %] 97 % (03/31 0648)  Intake/Output from previous day: 03/30 0701 - 03/31 0700 In: 2399.9 [I.V.:1499.9] Out: 4100 [Urine:4100] Intake/Output this shift: No intake/output data recorded.  Physical Exam:  General: Alert and oriented. CV: Regular rate Lungs: nwob GI: Soft, appropriately tender Incisions: Clean, dry, and intact Urine: Clear pink in tubing, no clots Extremities: Nontender, no erythema, no edema.  Lab Results: Recent Labs    04/05/19 1109 04/06/19 0525  HGB 15.8 15.7  HCT 48.6 48.3      Assessment/Plan: POD# 1 s/p robotic simple prostatectomy for BPH. CBI clamped yesterday, urine clear.  1) Advance diet as tolerated.  2) Ambulate, Incentive spirometry 3) Dc pelvic drain 4) Foley teaching  Plan discharge today.     LOS: 2 days   James Norris 04/07/2019, 8:23 AM

## 2019-04-07 NOTE — Progress Notes (Signed)
Patient given discharge, follow up, and medication instructions, verbalized understanding, IV and JP drain removed, personal belongings with patient, family to transport home

## 2019-04-07 NOTE — Discharge Summary (Signed)
Date of admission: 04/05/2019  Date of discharge: 04/07/2019  Admission diagnosis: BPH  Discharge diagnosis: BPH  History and Physical: For full details, please see admission history and physical. Briefly, James Norris is a 69 y.o. gentleman with BPH.  After discussing management/treatment options, he elected to proceed with surgical treatment.  Hospital Course: James Norris was taken to the operating room on 04/05/2019 and underwent a robotic assisted laparoscopic simple prostatectomy. He tolerated this procedure well and without complications. Postoperatively, he was able to be transferred to a regular hospital room following recovery from anesthesia.  He was able to begin ambulating the night of surgery. He remained hemodynamically stable. On POD1, CBI was clamped and urine remained clear. He had excellent urine output with appropriately minimal output from his pelvic drain and his pelvic drain was removed on POD #2.  He was transitioned to oral pain medication, tolerated a regular diet, and had met all discharge criteria and was able to be discharged home later on POD#2.   Laboratory values:  Recent Labs    04/05/19 1109 04/06/19 0525  HGB 15.8 15.7  HCT 48.6 48.3    Disposition: Home  Discharge instruction: He was instructed to be ambulatory but to refrain from heavy lifting, strenuous activity, or driving. He was instructed on urethral catheter care.  Discharge medications:   Allergies as of 04/07/2019   No Known Allergies     Medication List    STOP taking these medications   cholecalciferol 25 MCG (1000 UNIT) tablet Commonly known as: VITAMIN D3   multivitamin tablet     TAKE these medications   ALPRAZolam 0.5 MG tablet Commonly known as: XANAX Take 0.5 mg by mouth 3 (three) times daily as needed for anxiety.   atorvastatin 20 MG tablet Commonly known as: LIPITOR Take 20 mg by mouth daily.   ferrous sulfate 325 (65 FE) MG EC tablet Take 325 mg by mouth daily  with breakfast.   HYDROcodone-acetaminophen 5-325 MG tablet Commonly known as: Norco Take 1-2 tablets by mouth every 6 (six) hours as needed for moderate pain.   pantoprazole 40 MG tablet Commonly known as: PROTONIX Take 1 tablet (40 mg total) by mouth daily before breakfast.   sulfamethoxazole-trimethoprim 800-160 MG tablet Commonly known as: BACTRIM DS Take 1 tablet by mouth 2 (two) times daily. Start the day prior to foley removal appointment       Followup: He will followup as scheduled.

## 2019-04-08 ENCOUNTER — Emergency Department (HOSPITAL_COMMUNITY): Payer: PPO

## 2019-04-08 ENCOUNTER — Emergency Department (HOSPITAL_COMMUNITY)
Admission: EM | Admit: 2019-04-08 | Discharge: 2019-04-08 | Disposition: A | Discharge status: Home / Self Care | Payer: PPO | Attending: Emergency Medicine | Admitting: Emergency Medicine

## 2019-04-08 ENCOUNTER — Other Ambulatory Visit: Payer: Self-pay

## 2019-04-08 DIAGNOSIS — R7989 Other specified abnormal findings of blood chemistry: Secondary | ICD-10-CM | POA: Diagnosis not present

## 2019-04-08 DIAGNOSIS — R109 Unspecified abdominal pain: Secondary | ICD-10-CM | POA: Diagnosis not present

## 2019-04-08 DIAGNOSIS — Z87891 Personal history of nicotine dependence: Secondary | ICD-10-CM | POA: Diagnosis not present

## 2019-04-08 DIAGNOSIS — Z79899 Other long term (current) drug therapy: Secondary | ICD-10-CM | POA: Diagnosis not present

## 2019-04-08 DIAGNOSIS — R1010 Upper abdominal pain, unspecified: Secondary | ICD-10-CM

## 2019-04-08 DIAGNOSIS — R1011 Right upper quadrant pain: Secondary | ICD-10-CM | POA: Diagnosis not present

## 2019-04-08 DIAGNOSIS — R0789 Other chest pain: Secondary | ICD-10-CM | POA: Diagnosis present

## 2019-04-08 DIAGNOSIS — R079 Chest pain, unspecified: Secondary | ICD-10-CM | POA: Diagnosis not present

## 2019-04-08 DIAGNOSIS — R0781 Pleurodynia: Secondary | ICD-10-CM | POA: Diagnosis not present

## 2019-04-08 LAB — CBC WITH DIFFERENTIAL/PLATELET
Abs Immature Granulocytes: 0.09 10*3/uL — ABNORMAL HIGH (ref 0.00–0.07)
Basophils Absolute: 0.1 10*3/uL (ref 0.0–0.1)
Basophils Relative: 1 %
Eosinophils Absolute: 0.2 10*3/uL (ref 0.0–0.5)
Eosinophils Relative: 2 %
HCT: 45.3 % (ref 39.0–52.0)
Hemoglobin: 14.9 g/dL (ref 13.0–17.0)
Immature Granulocytes: 1 %
Lymphocytes Relative: 7 %
Lymphs Abs: 0.9 10*3/uL (ref 0.7–4.0)
MCH: 29.2 pg (ref 26.0–34.0)
MCHC: 32.9 g/dL (ref 30.0–36.0)
MCV: 88.8 fL (ref 80.0–100.0)
Monocytes Absolute: 0.9 10*3/uL (ref 0.1–1.0)
Monocytes Relative: 7 %
Neutro Abs: 10.3 10*3/uL — ABNORMAL HIGH (ref 1.7–7.7)
Neutrophils Relative %: 82 %
Platelets: 236 10*3/uL (ref 150–400)
RBC: 5.1 MIL/uL (ref 4.22–5.81)
RDW: 12.8 % (ref 11.5–15.5)
WBC: 12.4 10*3/uL — ABNORMAL HIGH (ref 4.0–10.5)
nRBC: 0 % (ref 0.0–0.2)

## 2019-04-08 LAB — COMPREHENSIVE METABOLIC PANEL
ALT: 26 U/L (ref 0–44)
AST: 18 U/L (ref 15–41)
Albumin: 3.4 g/dL — ABNORMAL LOW (ref 3.5–5.0)
Alkaline Phosphatase: 79 U/L (ref 38–126)
Anion gap: 6 (ref 5–15)
BUN: 18 mg/dL (ref 8–23)
CO2: 25 mmol/L (ref 22–32)
Calcium: 8.4 mg/dL — ABNORMAL LOW (ref 8.9–10.3)
Chloride: 107 mmol/L (ref 98–111)
Creatinine, Ser: 0.94 mg/dL (ref 0.61–1.24)
GFR calc Af Amer: >60 mL/min (ref >60)
GFR calc non Af Amer: >60 mL/min (ref >60)
Glucose, Bld: 175 mg/dL — ABNORMAL HIGH (ref 70–99)
Potassium: 4.2 mmol/L (ref 3.5–5.1)
Sodium: 138 mmol/L (ref 135–145)
Total Bilirubin: 0.8 mg/dL (ref 0.3–1.2)
Total Protein: 6.3 g/dL — ABNORMAL LOW (ref 6.5–8.1)

## 2019-04-08 LAB — LIPASE, BLOOD: Lipase: 21 U/L (ref 11–51)

## 2019-04-08 LAB — TROPONIN I (HIGH SENSITIVITY)
Troponin I (High Sensitivity): 3 ng/L (ref <18)
Troponin I (High Sensitivity): 3 ng/L (ref <18)

## 2019-04-08 LAB — D-DIMER, QUANTITATIVE: D-Dimer, Quant: 1.79 ug/mL-FEU — ABNORMAL HIGH (ref 0.00–0.50)

## 2019-04-08 MED ORDER — ONDANSETRON HCL 4 MG/2ML IJ SOLN
4.0000 mg | Freq: Once | INTRAMUSCULAR | Status: AC
  Administered 2019-04-08: 20:00:00 4 mg via INTRAVENOUS
  Filled 2019-04-08: qty 2

## 2019-04-08 MED ORDER — SODIUM CHLORIDE 0.9 % IV BOLUS
500.0000 mL | Freq: Once | INTRAVENOUS | Status: AC
  Administered 2019-04-08: 500 mL via INTRAVENOUS

## 2019-04-08 MED ORDER — IOHEXOL 350 MG/ML SOLN
100.0000 mL | Freq: Once | INTRAVENOUS | Status: AC | PRN
  Administered 2019-04-08: 100 mL via INTRAVENOUS

## 2019-04-08 MED ORDER — HYDROMORPHONE HCL 1 MG/ML IJ SOLN
0.5000 mg | Freq: Once | INTRAMUSCULAR | Status: AC
  Administered 2019-04-08: 0.5 mg via INTRAVENOUS
  Filled 2019-04-08: qty 1

## 2019-04-08 NOTE — ED Triage Notes (Signed)
Pt reports right chest pain that started approx 1 hour ago.  Pt states he had prostate surgery on Monday.  Pain is worse with deep breath.

## 2019-04-08 NOTE — Discharge Instructions (Addendum)
Increase your Protonix so you are taking it twice a day.  Return if any problems.  Take your pain medicine and your nausea medicine if needed

## 2019-04-09 NOTE — ED Provider Notes (Signed)
[facility] EMERGENCY DEPARTMENT Provider Note   CSN: [id number] Arrival date & time: 04/08/19  1909     History Chief Complaint  Patient presents with  . Chest Pain    James Norris is a 69 y.o. male.  Patient complains of right-sided chest pain worse with inspiration.  He recently had prostate surgery  The history is provided by the patient. No language interpreter was used.  Chest Pain Pain location:  R chest Pain quality: aching   Pain radiates to:  Does not radiate Pain severity:  Moderate Onset quality:  Sudden Timing:  Constant Progression:  Waxing and waning Chronicity:  New Context: breathing   Relieved by:  Nothing Worsened by:  Nothing Ineffective treatments:  None tried Associated symptoms: abdominal pain   Associated symptoms: no back pain, no cough, no fatigue and no headache        Past Medical History:  Diagnosis Date  . Anxiety   . GERD (gastroesophageal reflux disease)   . Hyperlipemia   . Kidney stone   . Nephrolithiasis 01/06/2019  . Sleep apnea    cpap    Patient Active Problem List   Diagnosis Date Noted  . BPH with obstruction/lower urinary tract symptoms 04/05/2019  . Fatty liver   . Nephrolithiasis 01/06/2019  . Aortic atherosclerosis ([place]) 01/27/2018  . GERD (gastroesophageal reflux disease) 07/07/2017  . Benign prostatic hyperplasia with lower urinary tract symptoms 07/07/2017  . Tobacco abuse, in remission 04/05/2017  . Heme positive stool 02/10/2012  . Anemia 02/10/2012  . High cholesterol 02/10/2012  . Multiple lung nodules on CT 02/10/2012  . Dysphagia 02/10/2012    Past Surgical History:  Procedure Laterality Date  . BALLOON DILATION N/A 03/11/2012   Procedure: BALLOON DILATION;  Surgeon: Rogene Houston, MD;  Location: [facility];  Service: Endoscopy;  Laterality: N/A;  . COLONOSCOPY WITH ESOPHAGOGASTRODUODENOSCOPY (EGD) N/A 03/11/2012   Procedure: COLONOSCOPY WITH ESOPHAGOGASTRODUODENOSCOPY (EGD);  Surgeon: Rogene Houston, MD;  Location: [facility];  Service: Endoscopy;  Laterality: N/A;  1200  . CYSTOSCOPY W/ RETROGRADES Left 01/25/2019   Procedure: CYSTOSCOPY WITH LEFT RETROGRADE PYELOGRAM;  Surgeon: Cleon Gustin, MD;  Location: [facility];  Service: Urology;  Laterality: Left;  . CYSTOSCOPY W/ URETERAL STENT REMOVAL Left 01/25/2019   Procedure: CYSTOSCOPY WITH LEFT URETERAL STENT REMOVAL;  Surgeon: Cleon Gustin, MD;  Location: [facility];  Service: Urology;  Laterality: Left;  . CYSTOSCOPY/URETEROSCOPY/HOLMIUM LASER/STENT PLACEMENT Left 01/06/2019   Procedure: CYSTOSCOPY/URETEROSCOPY//STENT PLACEMENT WITH LEFT RETROGRADE PYELOGRAM;  Surgeon: Cleon Gustin, MD;  Location: [facility];  Service: Urology;  Laterality: Left;  . ESOPHAGEAL DILATION N/A 02/15/2015   Procedure: ESOPHAGEAL DILATION;  Surgeon: Rogene Houston, MD;  Location: [facility];  Service: Endoscopy;  Laterality: N/A;  . ESOPHAGOGASTRODUODENOSCOPY N/A 02/15/2015   Procedure: ESOPHAGOGASTRODUODENOSCOPY (EGD);  Surgeon: Rogene Houston, MD;  Location: [facility];  Service: Endoscopy;  Laterality: N/A;  2:00  . EXTRACORPOREAL SHOCK WAVE LITHOTRIPSY Left 01/04/2019   Procedure: EXTRACORPOREAL SHOCK WAVE LITHOTRIPSY (ESWL);  Surgeon: Alexis Frock, MD;  Location: [facility];  Service: Urology;  Laterality: Left;  . EYE SURGERY     lasik- bilaterally  . TONSILLECTOMY    . URETEROSCOPY Left 01/25/2019   Procedure: DIAGNOSTIC LEFT URETEROSCOPY;  Surgeon: Cleon Gustin, MD;  Location: [facility];  Service: Urology;  Laterality: Left;  . XI ROBOTIC ASSISTED SIMPLE PROSTATECTOMY N/A 04/05/2019   Procedure: XI ROBOTIC ASSISTED SIMPLE PROSTATECTOMY;  Surgeon: [name]  L, MD;  Location: [facility];  Service: Urology;  Laterality: N/A;  3 HRS       Family History  Problem Relation Age of Onset  . Dementia Mother   . Breast cancer Mother   . Coronary artery disease Mother   . Coronary artery disease Father   . Hypertension Father    . Diabetes type II Father   . Hyperlipidemia Father   . GER disease Father     Social History   Tobacco Use  . Smoking status: Former Smoker    Packs/day: 1.50    Types: Cigarettes    Quit date: 08/27/2010    Years since quitting: 8.6  . Smokeless tobacco: Never Used  . Tobacco comment: quit 7 yrs ago  Substance Use Topics  . Alcohol use: Yes    Alcohol/week: 0.0 standard drinks    Comment: social rare  . Drug use: No    Home Medications Prior to Admission medications   Medication Sig Start Date End Date Taking? Authorizing Provider  ALPRAZolam Duanne Moron) 0.5 MG tablet Take 0.5 mg by mouth 3 (three) times daily as needed for anxiety.  12/13/11   [provider]  atorvastatin (LIPITOR) 20 MG tablet Take 20 mg by mouth daily. 12/01/18   [provider]  ferrous sulfate 325 (65 FE) MG EC tablet Take 325 mg by mouth daily with breakfast.     [provider]  HYDROcodone-acetaminophen (NORCO) 5-325 MG tablet Take 1-2 tablets by mouth every 6 (six) hours as needed for moderate pain. 04/05/19   Debbrah Alar, PA-C  pantoprazole (PROTONIX) 40 MG tablet Take 1 tablet (40 mg total) by mouth daily before breakfast. 01/20/18   Rehman, Mechele Dawley, MD  sulfamethoxazole-trimethoprim (BACTRIM DS) 800-160 MG tablet Take 1 tablet by mouth 2 (two) times daily. Start the day prior to foley removal appointment 04/05/19   Debbrah Alar, PA-C    Allergies    Patient has no known allergies.  Review of Systems   Review of Systems  Constitutional: Negative for appetite change and fatigue.  HENT: Negative for congestion, ear discharge and sinus pressure.   Eyes: Negative for discharge.  Respiratory: Negative for cough.   Cardiovascular: Positive for chest pain.  Gastrointestinal: Positive for abdominal pain. Negative for diarrhea.  Genitourinary: Negative for frequency and hematuria.  Musculoskeletal: Negative for back pain.  Skin: Negative for rash.  Neurological: Negative for  seizures and headaches.  Psychiatric/Behavioral: Negative for hallucinations.    Physical Exam Updated Vital Signs BP (!) 137/92 (BP Location: Left Arm)   Pulse 93   Resp 18   Ht 6' (1.829 m)   Wt 113.4 kg   SpO2 95%   BMI 33.91 kg/m   Physical Exam Vitals and nursing note reviewed.  Constitutional:      Appearance: He is well-developed.  HENT:     Head: Normocephalic.     Nose: Nose normal.     Mouth/Throat:     Mouth: Mucous membranes are moist.  Eyes:     General: No scleral icterus.    Conjunctiva/sclera: Conjunctivae normal.  Neck:     Thyroid: No thyromegaly.  Cardiovascular:     Rate and Rhythm: Normal rate and regular rhythm.     Heart sounds: No murmur. No friction rub. No gallop.   Pulmonary:     Breath sounds: No stridor. No wheezing or rales.  Chest:     Chest wall: No tenderness.  Abdominal:     General: There is  no distension.     Tenderness: There is abdominal tenderness. There is no rebound.  Musculoskeletal:        General: Normal range of motion.     Cervical back: Neck supple.  Lymphadenopathy:     Cervical: No cervical adenopathy.  Skin:    Findings: No erythema or rash.  Neurological:     Mental Status: He is alert and oriented to person, place, and time.     Motor: No abnormal muscle tone.     Coordination: Coordination normal.  Psychiatric:        Behavior: Behavior normal.     ED Results / Procedures / Treatments   Labs (all labs ordered are listed, but only abnormal results are displayed) Labs Reviewed  CBC WITH DIFFERENTIAL/PLATELET - Abnormal; Notable for the following components:      Result Value   WBC 12.4 (*)    Neutro Abs 10.3 (*)    Abs Immature Granulocytes 0.09 (*)    All other components within normal limits  COMPREHENSIVE METABOLIC PANEL - Abnormal; Notable for the following components:   Glucose, Bld 175 (*)    Calcium 8.4 (*)    Total Protein 6.3 (*)    Albumin 3.4 (*)    All other components within normal  limits  D-DIMER, QUANTITATIVE (NOT AT [facility]) - Abnormal; Notable for the following components:   D-Dimer, Quant 1.79 (*)    All other components within normal limits  LIPASE, BLOOD  TROPONIN I (HIGH SENSITIVITY)  TROPONIN I (HIGH SENSITIVITY)    EKG None  Radiology CT Angio Chest PE W and/or Wo Contrast  Result Date: 04/08/2019 CLINICAL DATA:  Recent prostate surgery, chest pain for 1 hour, pain with inspiration, elevated D dimer EXAM: CT ANGIOGRAPHY CHEST WITH CONTRAST TECHNIQUE: Multidetector CT imaging of the chest was performed using the standard protocol during bolus administration of intravenous contrast. Multiplanar CT image reconstructions and MIPs were obtained to evaluate the vascular anatomy. CONTRAST:  131mL OMNIPAQUE IOHEXOL 350 MG/ML SOLN COMPARISON:  08/11/2018 FINDINGS: Cardiovascular: This is a technically adequate evaluation of the pulmonary vasculature. No filling defects or pulmonary emboli. The heart is unremarkable without pericardial effusion. Mild calcification of the mitral and aortic valves. Mild atherosclerosis of the aorta and coronary vessels. No evidence of thoracic aortic aneurysm or dissection. Mediastinum/Nodes: There is a moderate hiatal hernia. Esophagus, trachea, and thyroid unremarkable. No pathologic adenopathy. Lungs/Pleura: Hypoventilatory changes are seen at the lung bases. Stable 10 mm left lower lobe pulmonary nodule, reference image 66. Trace right pleural fluid. No pneumothorax. Central airways are patent. Upper Abdomen: No acute abnormality. Musculoskeletal: No acute or destructive bony lesions. Reconstructed images demonstrate no additional findings. Review of the MIP images confirms the above findings. IMPRESSION: 1. No evidence of pulmonary embolus. 2. Moderate hiatal hernia. 3. Hypoventilatory changes of the lung bases, with trace right pleural effusion. 4. 10 mm left lower lobe pulmonary nodule. This is not appreciably changed since 2019, where it was  shown to be PET negative. This is likely benign. Electronically Signed   By: Randa Ngo M.D.   On: 04/08/2019 22:27   CT ABDOMEN PELVIS W CONTRAST  Result Date: 04/08/2019 CLINICAL DATA:  Right-sided chest pain, recent prostate surgery, pain with inspiration, generalized abdominal pain, neutropenia EXAM: CT ABDOMEN AND PELVIS WITH CONTRAST TECHNIQUE: Multidetector CT imaging of the abdomen and pelvis was performed using the standard protocol following bolus administration of intravenous contrast. CONTRAST:  164mL OMNIPAQUE IOHEXOL 350 MG/ML SOLN COMPARISON:  01/02/2019  FINDINGS: Lower chest: Hypoventilatory changes are seen at the lung bases. Moderate hiatal hernia. Hepatobiliary: No focal liver abnormality is seen. No gallstones, gallbladder wall thickening, or biliary dilatation. Pancreas: Unremarkable. No pancreatic ductal dilatation or surrounding inflammatory changes. Spleen: Normal in size without focal abnormality. Adrenals/Urinary Tract: Stable left renal cyst. Stable 5 mm nonobstructing right renal calculus. No obstructive uropathy within either kidney. Bladder is decompressed. Adrenals are unremarkable. Stomach/Bowel: No bowel obstruction or ileus. Normal appendix right lower quadrant. Scattered diverticulosis of the sigmoid colon without diverticulitis. Vascular/Lymphatic: Aortic atherosclerosis. No enlarged abdominal or pelvic lymph nodes. Reproductive: Interval postsurgical changes with decreased size of the prostate. Foley catheter traverses the prostate, with balloon inflated near the site of prostate resection. Please correlate with catheter function. Other: There is fat stranding within the anterior abdominal wall the level the umbilicus, subcutaneous gas within the left lateral abdominal wall, and fat stranding ventral to the bladder, all likely related to recent surgery. No free fluid or free intraperitoneal gas.  No fluid collections. Musculoskeletal: No acute or destructive bony lesions.  Reconstructed images demonstrate no additional findings. IMPRESSION: 1. Postsurgical changes related to partial resection of the prostate. Foley catheter balloon inflated within the surgical bed. No evidence of complication. 2. Stable nonobstructing 5 mm right renal calculus. 3. Moderate hiatal hernia. Electronically Signed   By: Randa Ngo M.D.   On: 04/08/2019 22:22   DG Chest Port 1 View  Result Date: 04/08/2019 CLINICAL DATA:  Right-sided chest pain, recent prostate surgery, previous tobacco abuse EXAM: PORTABLE CHEST 1 VIEW COMPARISON:  03/07/2004, 08/11/2018 FINDINGS: Single frontal view of the chest demonstrates an unremarkable cardiac silhouette. No airspace disease, effusion, or pneumothorax. No acute bony abnormalities. IMPRESSION: 1. No acute intrathoracic process. Electronically Signed   By: Randa Ngo M.D.   On: 04/08/2019 20:45    Procedures Procedures (including critical care time)  Medications Ordered in ED Medications  sodium chloride 0.9 % bolus 500 mL (0 mLs Intravenous Stopped 04/08/19 2245)  HYDROmorphone (DILAUDID) injection 0.5 mg (0.5 mg Intravenous Given 04/08/19 2002)  ondansetron (ZOFRAN) injection 4 mg (4 mg Intravenous Given 04/08/19 2002)  iohexol (OMNIPAQUE) 350 MG/ML injection 100 mL (100 mLs Intravenous Contrast Given 04/08/19 2150)    ED Course  I have reviewed the triage vital signs and the nursing notes.  Pertinent labs & imaging results that were available during my care of the patient were reviewed by me and considered in my medical decision making (see chart for details).    MDM Rules/Calculators/A&P                      Patient with pleuritic right-sided chest pain and some right upper quadrant pain.  CT scan of chest and abdomen all on remarkable.  Patient does have a big hiatal hernia.  He is feeling much better now.  I told him to increase his proton to twice a day and follow-up with his doctor Final Clinical Impression(s) / ED Diagnoses Final  diagnoses:  Pain of upper abdomen    Rx / DC Orders ED Discharge Orders    None       Milton Ferguson, MD 04/09/19 1048

## 2019-04-10 ENCOUNTER — Other Ambulatory Visit: Payer: Self-pay | Admitting: Urology

## 2019-04-10 ENCOUNTER — Encounter (INDEPENDENT_AMBULATORY_CARE_PROVIDER_SITE_OTHER): Payer: Self-pay

## 2019-04-10 DIAGNOSIS — N401 Enlarged prostate with lower urinary tract symptoms: Secondary | ICD-10-CM

## 2019-04-10 MED ORDER — OXYBUTYNIN CHLORIDE 5 MG PO TABS: 5.0000 mg | ORAL_TABLET | Freq: Three times a day (TID) | ORAL | 1 refills | Status: DC | PRN

## 2019-04-11 ENCOUNTER — Other Ambulatory Visit (INDEPENDENT_AMBULATORY_CARE_PROVIDER_SITE_OTHER): Payer: Self-pay | Admitting: Internal Medicine

## 2019-04-20 ENCOUNTER — Telehealth: Payer: Self-pay | Admitting: Urology

## 2019-04-20 NOTE — Telephone Encounter (Signed)
Pt requests a nurse return his call regarding his cath issues.

## 2019-04-21 ENCOUNTER — Ambulatory Visit (HOSPITAL_COMMUNITY)
Admission: RE | Admit: 2019-04-21 | Discharge: 2019-04-21 | Disposition: A | Discharge status: Home / Self Care | Payer: PPO | Source: Ambulatory Visit | Attending: Urology | Admitting: Urology

## 2019-04-21 ENCOUNTER — Encounter: Payer: Self-pay | Admitting: Urology

## 2019-04-21 ENCOUNTER — Other Ambulatory Visit: Payer: Self-pay

## 2019-04-21 ENCOUNTER — Other Ambulatory Visit: Payer: Self-pay | Admitting: Urology

## 2019-04-21 ENCOUNTER — Encounter (HOSPITAL_COMMUNITY): Payer: Self-pay

## 2019-04-21 ENCOUNTER — Ambulatory Visit: Payer: PPO | Admitting: Urology

## 2019-04-21 ENCOUNTER — Ambulatory Visit (INDEPENDENT_AMBULATORY_CARE_PROVIDER_SITE_OTHER): Payer: PPO | Admitting: Urology

## 2019-04-21 VITALS — BP 156/88 | HR 81 | Temp 97.0°F | Ht 72.0 in | Wt 250.0 lb

## 2019-04-21 DIAGNOSIS — N138 Other obstructive and reflux uropathy: Secondary | ICD-10-CM | POA: Insufficient documentation

## 2019-04-21 DIAGNOSIS — N401 Enlarged prostate with lower urinary tract symptoms: Secondary | ICD-10-CM

## 2019-04-21 DIAGNOSIS — N139 Obstructive and reflux uropathy, unspecified: Secondary | ICD-10-CM | POA: Diagnosis not present

## 2019-04-21 MED ORDER — IOTHALAMATE MEGLUMINE 17.2 % UR SOLN: 130.0000 mL | Freq: Once | URETHRAL | Status: DC | PRN

## 2019-04-21 NOTE — Progress Notes (Signed)
Urological Symptom Review  Patient is experiencing the following symptoms: none   Review of Systems  Gastrointestinal (upper)  : Negative for upper GI symptoms  Gastrointestinal (lower) : Negative for lower GI symptoms  Constitutional : Negative for symptoms  Skin: Negative for skin symptoms  Eyes: Negative for eye symptoms  Ear/Nose/Throat : Negative for Ear/Nose/Throat symptoms  Hematologic/Lymphatic: Negative for Hematologic/Lymphatic symptoms  Cardiovascular : Negative for cardiovascular symptoms  Respiratory : Negative for respiratory symptoms  Endocrine: Negative for endocrine symptoms  Musculoskeletal: Negative for musculoskeletal symptoms  Neurological: Negative for neurological symptoms  Psychologic: Negative for psychiatric symptoms   Fill and Pull Catheter Removal  Patient is present today for a catheter removal.  Patient was cleaned and prepped in a sterile fashion 100 ml of sterile water/ saline was instilled into the bladder when the patient felt the urge to urinate. 38ml of water was then drained from the balloon.  A 22FR foley cath was removed from the bladder no complications were noted .  Patient was then given some time to void on their own.  Patient can void  19ml on their own after some time.  Patient tolerated well.  Performed by: Tonna Palazzi LPN  Follow up/ Additional notes: Per MD note

## 2019-04-21 NOTE — Patient Instructions (Signed)

## 2019-04-21 NOTE — Progress Notes (Signed)
04/21/2019 1:22 PM   SEIF BAUL 20-Apr-1950 [id number]  Referring provider: Redmond School, MD [address] [place],  [place] [address]  Post op followup  HPI: Mr Stewart is a [phone] here for followup after simple prostatectomy. Pathology benign.cystogram showed no leak. No pain.    PMH: Past Medical History:  Diagnosis Date   Anxiety    GERD (gastroesophageal reflux disease)    Hyperlipemia    Kidney stone    Nephrolithiasis 01/06/2019   Sleep apnea    cpap    Surgical History: Past Surgical History:  Procedure Laterality Date   BALLOON DILATION N/A 03/11/2012   Procedure: BALLOON DILATION;  Surgeon: Rogene Houston, MD;  [facility];  Service: Endoscopy;  Laterality: N/A;   COLONOSCOPY WITH ESOPHAGOGASTRODUODENOSCOPY (EGD) N/A 03/11/2012   Procedure: COLONOSCOPY WITH ESOPHAGOGASTRODUODENOSCOPY (EGD);  Surgeon: Rogene Houston, MD;  [facility];  Service: Endoscopy;  Laterality: N/A;  Ada Left 01/25/2019   Procedure: CYSTOSCOPY WITH LEFT RETROGRADE PYELOGRAM;  Surgeon: Cleon Gustin, MD;  Location: [facility];  Service: Urology;  Laterality: Left;   CYSTOSCOPY W/ URETERAL STENT REMOVAL Left 01/25/2019   Procedure: CYSTOSCOPY WITH LEFT URETERAL STENT REMOVAL;  Surgeon: Cleon Gustin, MD;  Location: [facility];  Service: Urology;  Laterality: Left;   CYSTOSCOPY/URETEROSCOPY/HOLMIUM LASER/STENT PLACEMENT Left 01/06/2019   Procedure: CYSTOSCOPY/URETEROSCOPY//STENT PLACEMENT WITH LEFT RETROGRADE PYELOGRAM;  Surgeon: Cleon Gustin, MD;  Location: [facility];  Service: Urology;  Laterality: Left;   ESOPHAGEAL DILATION N/A 02/15/2015   Procedure: ESOPHAGEAL DILATION;  Surgeon: Rogene Houston, MD;  [facility];  Service: Endoscopy;  Laterality: N/A;   ESOPHAGOGASTRODUODENOSCOPY N/A 02/15/2015   Procedure: ESOPHAGOGASTRODUODENOSCOPY (EGD);  Surgeon: Rogene Houston, MD;  [facility];   Service: Endoscopy;  Laterality: N/A;  2:00   EXTRACORPOREAL SHOCK WAVE LITHOTRIPSY Left 01/04/2019   Procedure: EXTRACORPOREAL SHOCK WAVE LITHOTRIPSY (ESWL);  Surgeon: Alexis Frock, MD;  Location: [facility];  Service: Urology;  Laterality: Left;   EYE SURGERY     lasik- bilaterally   TONSILLECTOMY     URETEROSCOPY Left 01/25/2019   Procedure: DIAGNOSTIC LEFT URETEROSCOPY;  Surgeon: Cleon Gustin, MD;  Location: [facility];  Service: Urology;  Laterality: Left;   XI ROBOTIC ASSISTED SIMPLE PROSTATECTOMY N/A 04/05/2019   Procedure: XI ROBOTIC ASSISTED SIMPLE PROSTATECTOMY;  Surgeon: Cleon Gustin, MD;  Location: [facility];  Service: Urology;  Laterality: N/A;  3 HRS    Home Medications:  Allergies as of 04/21/2019   No Known Allergies     Medication List       Accurate as of April 21, 2019  1:22 PM. If you have any questions, ask your nurse or doctor.        ALPRAZolam 0.5 MG tablet Commonly known as: XANAX Take 0.5 mg by mouth 3 (three) times daily as needed for anxiety.   atorvastatin 20 MG tablet Commonly known as: LIPITOR Take 20 mg by mouth daily.   ferrous sulfate 325 (65 FE) MG EC tablet Take 325 mg by mouth daily with breakfast.   HYDROcodone-acetaminophen 5-325 MG tablet Commonly known as: Norco Take 1-2 tablets by mouth every 6 (six) hours as needed for moderate pain.   oxybutynin 5 MG tablet Commonly known as: DITROPAN Take 1 tablet (5 mg total) by mouth every 8 (eight) hours as needed for bladder spasms.   pantoprazole 40 MG tablet Commonly known as: PROTONIX TAKE 1 TABLET  BY MOUTH ONCE DAILY BEFORE BREAKFAST   sulfamethoxazole-trimethoprim 800-160 MG tablet Commonly known as: BACTRIM DS Take 1 tablet by mouth 2 (two) times daily. Start the day prior to foley removal appointment       Allergies: No Known Allergies  Family History: Family History  Problem Relation Age of Onset   Dementia Mother    Breast cancer Mother    Coronary artery  disease Mother    Coronary artery disease Father    Hypertension Father    Diabetes type II Father    Hyperlipidemia Father    GER disease Father     Social History:  reports that he quit smoking about 8 years ago. His smoking use included cigarettes. He smoked 1.50 packs per day. He has never used smokeless tobacco. He reports current alcohol use. He reports that he does not use drugs.  ROS: All other review of systems were reviewed and are negative except what is noted above in HPI  Physical Exam: BP (!) 156/88    Pulse 81    Temp (!) 97 F (36.1 C)    Ht 6' (1.829 m)    Wt 250 lb (113.4 kg)    BMI 33.91 kg/m   Constitutional:  Alert and oriented, No acute distress. HEENT: [place] AT, moist mucus membranes.  Trachea midline, no masses. Cardiovascular: No clubbing, cyanosis, or edema. Respiratory: Normal respiratory effort, no increased work of breathing. GI: Abdomen is soft, nontender, nondistended, no abdominal masses GU: No CVA tenderness.  Lymph: No cervical or inguinal lymphadenopathy. Skin: No rashes, bruises or suspicious lesions. Neurologic: Grossly intact, no focal deficits, moving all 4 extremities. Psychiatric: Normal mood and affect.  Laboratory Data: Lab Results  Component Value Date   WBC 12.4 (H) 04/08/2019   HGB 14.9 04/08/2019   HCT 45.3 04/08/2019   MCV 88.8 04/08/2019   PLT 236 04/08/2019    Lab Results  Component Value Date   CREATININE 0.94 04/08/2019    No results found for: PSA  No results found for: TESTOSTERONE  No results found for: HGBA1C  Urinalysis    Component Value Date/Time   COLORURINE YELLOW 01/05/2019 2056   APPEARANCEUR CLOUDY (A) 01/05/2019 2056   LABSPEC 1.017 01/05/2019 2056   PHURINE 5.0 01/05/2019 2056   GLUCOSEU NEGATIVE 01/05/2019 2056   HGBUR LARGE (A) 01/05/2019 2056   BILIRUBINUR negative 01/06/2019 0900   KETONESUR 20 (A) 01/05/2019 2056   PROTEINUR Positive (A) 01/06/2019 0900   PROTEINUR 30 (A) 01/05/2019  2056   UROBILINOGEN negative (A) 01/06/2019 0900   UROBILINOGEN 0.2 03/27/2010 1655   NITRITE negative 01/06/2019 0900   NITRITE NEGATIVE 01/05/2019 2056   LEUKOCYTESUR Moderate (2+) (A) 01/06/2019 0900   LEUKOCYTESUR SMALL (A) 01/05/2019 2056    Lab Results  Component Value Date   BACTERIA RARE (A) 01/05/2019    Pertinent Imaging: Cystogram 4/14: images reviewed and discussed with the patient Results for orders placed during the hospital encounter of 01/04/19  DG Abd 1 View   Narrative CLINICAL DATA:  Left flank pain.  History of left ureteral calculus.  EXAM: ABDOMEN - 1 VIEW  COMPARISON:  CT scan 01/02/2019  FINDINGS: Stable appearing position of the left ureteral calculus located adjacent to the L4 vertebral body. Stable lower pole right renal calculus.  The bowel gas pattern is unremarkable. Scattered air in stool noted in the colon. No free air.  IMPRESSION: 1. Stable mid left ureteral calculus. 2. Right lower pole renal calculus. 3. Unremarkable bowel gas  pattern.   Electronically Signed   By: Marijo Sanes M.D.   On: 01/04/2019 06:39    No results found for this or any previous visit. No results found for this or any previous visit. No results found for this or any previous visit. Results for orders placed during the hospital encounter of 02/26/19  US RENAL   Narrative CLINICAL DATA:  Nephrolithiasis.  Gross hematuria.  EXAM: RENAL / URINARY TRACT ULTRASOUND COMPLETE  COMPARISON:  CT, 01/02/2019  FINDINGS: Right Kidney:  Renal measurements: 13.0 x 7.2 x 7.5 cm = volume: 365.3 mL. Normal parenchymal echogenicity. No masses. Shadowing echogenic stone in the lower pole, 8 mm. No hydronephrosis.  Left Kidney:  Renal measurements: 13.3 x 7.7 x 6.8 cm = volume: 364.8 mL. Normal parenchymal echogenicity. No masses. Shadowing echogenic stone in the lower pole, 10 mm. No hydronephrosis.  Bladder:  Mildly distended. There are calcifications  projecting at the bladder base. No mass.  Other:  Enlarged prostate, volume calculated at 288 mL.  IMPRESSION: 1. No acute findings.  No hydronephrosis. 2. Nonobstructing stones in the lower pole of each kidney, on the right present on the prior CT, not seen on the prior CT in the left kidney. 3. Stones project at the bladder base.  No bladder mass. 4. Significant prosthetic enlargement.   Electronically Signed   By: Lajean Manes M.D.   On: 02/26/2019 15:53    No results found for this or any previous visit. No results found for this or any previous visit. Results for orders placed during the hospital encounter of 01/02/19  CT Renal Stone Study   Narrative CLINICAL DATA:  Left flank pain.  EXAM: CT ABDOMEN AND PELVIS WITHOUT CONTRAST  TECHNIQUE: Multidetector CT imaging of the abdomen and pelvis was performed following the standard protocol without IV contrast.  COMPARISON:  03/27/2010  FINDINGS: Lower chest: 10 mm lobular nodule in the left lower lobe is stable compared to chest CT of 08/11/2018. This nodule measured 9 mm on the cardiac CT of 10/17/2017 and was 5 mm on a chest CT of 02/11/2012.  Hepatobiliary: No suspicious focal abnormality within the liver parenchyma. There is no evidence for gallstones, gallbladder wall thickening, or pericholecystic fluid. No intrahepatic or extrahepatic biliary dilation.  Pancreas: No focal mass lesion. No dilatation of the main duct. No intraparenchymal cyst. No peripancreatic edema.  Spleen: No splenomegaly. No focal mass lesion.  Adrenals/Urinary Tract: No adrenal nodule or mass. 5 mm nonobstructing stone noted lower pole right kidney. No right ureteral stone. No secondary changes in the right kidney or ureter. No left renal stones although there is mild left hydronephrosis. 7 x 7 x 4 mm stone identified in the proximal left ureter (axial 46/series 2). No other left urinary stone. 2.1 cm water density lesion lower  pole left kidney is compatible with a cyst. No bladder stone.  Stomach/Bowel: Small to moderate hiatal hernia. Duodenum is normally positioned as is the ligament of Treitz. No small bowel wall thickening. No small bowel dilatation. The terminal ileum is normal. The appendix is best seen on coronal images and is unremarkable. No gross colonic mass. No colonic wall thickening. Diverticular changes are noted in the left colon without evidence of diverticulitis.  Vascular/Lymphatic: There is abdominal aortic atherosclerosis without aneurysm. There is no gastrohepatic or hepatoduodenal ligament lymphadenopathy. No retroperitoneal or mesenteric lymphadenopathy. No pelvic sidewall lymphadenopathy.  Reproductive: Prostate gland is markedly enlarged.  Other: No intraperitoneal free fluid.  Musculoskeletal: No worrisome lytic or  sclerotic osseous abnormality.  IMPRESSION: 1. 7 x 7 x 4 mm stone identified in the proximal left ureter with mild left hydronephrosis. 2. 5 mm nonobstructing right renal stone. No other urinary stone disease evident on today's study. 3. 10 mm lobular nodule left lower lobe has shown slow growth comparing back over almost 7 years to a study from 2014. Given the growth characteristics, this is probably benign. Conservatively, follow-up CT chest in 12 months could be used to reassess. 4. Small to moderate hiatal hernia. 5. Marked prostatomegaly. 6.  Aortic Atherosclerois (ICD10-170.0)   Electronically Signed   By: Misty Stanley M.D.   On: 01/02/2019 10:53     Assessment & Plan:    1. Benign prostatic hyperplasia with urinary obstruction -RTC 1 month with PVR - Cystogram; Future   No follow-ups on file.  [name], MD  [facility] [place]

## 2019-04-21 NOTE — Telephone Encounter (Signed)
Pt came by office yesterday asking for stent to be removed. Pt has appt today with Dr. Alyson Ingles. Pt was advised by front desk staff Dr. Alyson Ingles would be here today to remove stent.

## 2019-05-03 DIAGNOSIS — E6609 Other obesity due to excess calories: Secondary | ICD-10-CM | POA: Diagnosis not present

## 2019-05-03 DIAGNOSIS — N342 Other urethritis: Secondary | ICD-10-CM | POA: Diagnosis not present

## 2019-05-03 DIAGNOSIS — Z6835 Body mass index (BMI) 35.0-35.9, adult: Secondary | ICD-10-CM | POA: Diagnosis not present

## 2019-05-11 ENCOUNTER — Encounter (INDEPENDENT_AMBULATORY_CARE_PROVIDER_SITE_OTHER): Payer: PPO | Admitting: Ophthalmology

## 2019-05-24 ENCOUNTER — Other Ambulatory Visit: Payer: Self-pay

## 2019-05-24 ENCOUNTER — Encounter: Payer: Self-pay | Admitting: Urology

## 2019-05-24 ENCOUNTER — Ambulatory Visit: Payer: PPO | Admitting: Urology

## 2019-05-24 VITALS — BP 135/77 | HR 93 | Temp 97.7°F | Ht 72.0 in | Wt 250.0 lb

## 2019-05-24 DIAGNOSIS — N138 Other obstructive and reflux uropathy: Secondary | ICD-10-CM | POA: Diagnosis not present

## 2019-05-24 DIAGNOSIS — R3915 Urgency of urination: Secondary | ICD-10-CM

## 2019-05-24 DIAGNOSIS — N401 Enlarged prostate with lower urinary tract symptoms: Secondary | ICD-10-CM | POA: Diagnosis not present

## 2019-05-24 LAB — POCT URINALYSIS DIPSTICK
Bilirubin, UA: NEGATIVE
Blood, UA: NEGATIVE
Glucose, UA: NEGATIVE
Ketones, UA: NEGATIVE
Leukocytes, UA: NEGATIVE
Nitrite, UA: NEGATIVE
Protein, UA: POSITIVE — AB
Spec Grav, UA: >=1.03 — AB (ref 1.010–1.025)
Urobilinogen, UA: 0.2 E.U./dL
pH, UA: 5 (ref 5.0–8.0)

## 2019-05-24 LAB — BLADDER SCAN AMB NON-IMAGING: Scan Result: 68.4

## 2019-05-24 MED ORDER — MIRABEGRON ER 25 MG PO TB24: 25.0000 mg | ORAL_TABLET | Freq: Every day | ORAL | 0 refills | Status: DC

## 2019-05-24 NOTE — Progress Notes (Signed)
Urological Symptom Review  Patient is experiencing the following symptoms: Frequent urination Hard to postpone urination Burning/pain with urination Leakage of urine   Review of Systems  Gastrointestinal (upper)  : Negative for upper GI symptoms  Gastrointestinal (lower) : Negative for lower GI symptoms  Constitutional : Negative for symptoms  Skin: Negative for skin symptoms  Eyes: Negative for eye symptoms  Ear/Nose/Throat : Negative for Ear/Nose/Throat symptoms  Hematologic/Lymphatic: Negative for Hematologic/Lymphatic symptoms  Cardiovascular : Negative for cardiovascular symptoms  Respiratory : Negative for respiratory symptoms  Endocrine: Negative for endocrine symptoms  Musculoskeletal: Negative for musculoskeletal symptoms  Neurological: Negative for neurological symptoms  Psychologic: Negative for psychiatric symptoms

## 2019-05-24 NOTE — Patient Instructions (Signed)

## 2019-05-24 NOTE — Progress Notes (Signed)
05/24/2019 3:43 PM   James Norris 1950/04/11 [id number]  Referring provider: Redmond School, MD [address] [place],  [place] [address]  Urinary urgency  HPI: Mr Heward is a [phone] here for followup for BPH. Since simple prostatectomy his stream is strong. He has urinary urgency has worsened since last visit. He is drinking more coffee and grapefruit juice. He has intermittent dysuria. UA today is not concerning for infection. Urine is concentrated. PVR 60cc.    PMH: Past Medical History:  Diagnosis Date  . Anxiety   . GERD (gastroesophageal reflux disease)   . Hyperlipemia   . Kidney stone   . Nephrolithiasis 01/06/2019  . Sleep apnea    cpap    Surgical History: Past Surgical History:  Procedure Laterality Date  . BALLOON DILATION N/A 03/11/2012   Procedure: BALLOON DILATION;  Surgeon: Rogene Houston, MD;  [facility];  Service: Endoscopy;  Laterality: N/A;  . COLONOSCOPY WITH ESOPHAGOGASTRODUODENOSCOPY (EGD) N/A 03/11/2012   Procedure: COLONOSCOPY WITH ESOPHAGOGASTRODUODENOSCOPY (EGD);  Surgeon: Rogene Houston, MD;  [facility];  Service: Endoscopy;  Laterality: N/A;  1200  . CYSTOSCOPY W/ RETROGRADES Left 01/25/2019   Procedure: CYSTOSCOPY WITH LEFT RETROGRADE PYELOGRAM;  Surgeon: Cleon Gustin, MD;  Location: [facility];  Service: Urology;  Laterality: Left;  . CYSTOSCOPY W/ URETERAL STENT REMOVAL Left 01/25/2019   Procedure: CYSTOSCOPY WITH LEFT URETERAL STENT REMOVAL;  Surgeon: Cleon Gustin, MD;  [facility]: [facility];  Service: Urology;  Laterality: Left;  . CYSTOSCOPY/URETEROSCOPY/HOLMIUM LASER/STENT PLACEMENT Left 01/06/2019   Procedure: CYSTOSCOPY/URETEROSCOPY//STENT PLACEMENT WITH LEFT RETROGRADE PYELOGRAM;  Surgeon: Cleon Gustin, MD;  [facility];  Service: Urology;  Laterality: Left;  . ESOPHAGEAL DILATION N/A 02/15/2015   Procedure: ESOPHAGEAL DILATION;  Surgeon: Rogene Houston, MD;  [facility];   Service: Endoscopy;  Laterality: N/A;  . ESOPHAGOGASTRODUODENOSCOPY N/A 02/15/2015   Procedure: ESOPHAGOGASTRODUODENOSCOPY (EGD);  Surgeon: Rogene Houston, MD;  [facility];  Service: Endoscopy;  Laterality: N/A;  2:00  . EXTRACORPOREAL SHOCK WAVE LITHOTRIPSY Left 01/04/2019   Procedure: EXTRACORPOREAL SHOCK WAVE LITHOTRIPSY (ESWL);  Surgeon: Alexis Frock, MD;  [facility];  Service: Urology;  Laterality: Left;  . EYE SURGERY     lasik- bilaterally  . TONSILLECTOMY    . URETEROSCOPY Left 01/25/2019   Procedure: DIAGNOSTIC LEFT URETEROSCOPY;  Surgeon: Cleon Gustin, MD;  [facility];  Service: Urology;  Laterality: Left;  . XI ROBOTIC ASSISTED SIMPLE PROSTATECTOMY N/A 04/05/2019   Procedure: XI ROBOTIC ASSISTED SIMPLE PROSTATECTOMY;  Surgeon: Cleon Gustin, MD;  [facility];  Service: Urology;  Laterality: N/A;  3 HRS    Home Medications:  Allergies as of 05/24/2019   No Known Allergies     Medication List       Accurate as of May 24, 2019  3:43 PM. If you have any questions, ask your nurse or doctor.        ALPRAZolam 0.5 MG tablet Commonly known as: XANAX Take 0.5 mg by mouth 3 (three) times daily as needed for anxiety.   atorvastatin 20 MG tablet Commonly known as: LIPITOR Take 20 mg by mouth daily.   ciprofloxacin 500 MG tablet Commonly known as: CIPRO Take 500 mg by mouth 2 (two) times daily.   ferrous sulfate 325 (65 FE) MG EC tablet Take 325 mg by mouth daily with breakfast.   HYDROcodone-acetaminophen 5-325 MG tablet Commonly known as: Norco Take 1-2 tablets by  mouth every 6 (six) hours as needed for moderate pain.   oxybutynin 5 MG tablet Commonly known as: DITROPAN Take 1 tablet (5 mg total) by mouth every 8 (eight) hours as needed for bladder spasms.   pantoprazole 40 MG tablet Commonly known as: PROTONIX TAKE 1 TABLET BY MOUTH ONCE DAILY BEFORE BREAKFAST   sildenafil 20 MG tablet Commonly known as: REVATIO Take 20  mg by mouth 3 (three) times daily.   sulfamethoxazole-trimethoprim 800-160 MG tablet Commonly known as: BACTRIM DS Take 1 tablet by mouth 2 (two) times daily. Start the day prior to foley removal appointment       Allergies: No Known Allergies  Family History: Family History  Problem Relation Age of Onset  . Dementia Mother   . Breast cancer Mother   . Coronary artery disease Mother   . Coronary artery disease Father   . Hypertension Father   . Diabetes type II Father   . Hyperlipidemia Father   . GER disease Father     Social History:  reports that he quit smoking about 8 years ago. His smoking use included cigarettes. He smoked 1.50 packs per day. He has never used smokeless tobacco. He reports current alcohol use. He reports that he does not use drugs.  ROS: All other review of systems were reviewed and are negative except what is noted above in HPI  Physical Exam: BP 135/77   Pulse 93   Temp 97.7 F (36.5 C)   Ht 6' (1.829 m)   Wt 250 lb (113.4 kg)   BMI 33.91 kg/m   Constitutional:  Alert and oriented, No acute distress. HEENT: [place] AT, moist mucus membranes.  Trachea midline, no masses. Cardiovascular: No clubbing, cyanosis, or edema. Respiratory: Normal respiratory effort, no increased work of breathing. GI: Abdomen is soft, nontender, nondistended, no abdominal masses GU: No CVA tenderness.  Lymph: No cervical or inguinal lymphadenopathy. Skin: No rashes, bruises or suspicious lesions. Neurologic: Grossly intact, no focal deficits, moving all 4 extremities. Psychiatric: Normal mood and affect.  Laboratory Data: Lab Results  Component Value Date   WBC 12.4 (H) 04/08/2019   HGB 14.9 04/08/2019   HCT 45.3 04/08/2019   MCV 88.8 04/08/2019   PLT 236 04/08/2019    Lab Results  Component Value Date   CREATININE 0.94 04/08/2019    No results found for: PSA  No results found for: TESTOSTERONE  No results found for: HGBA1C  Urinalysis    Component  Value Date/Time   COLORURINE YELLOW 01/05/2019 2056   APPEARANCEUR CLOUDY (A) 01/05/2019 2056   LABSPEC 1.017 01/05/2019 2056   PHURINE 5.0 01/05/2019 2056   GLUCOSEU NEGATIVE 01/05/2019 2056   HGBUR LARGE (A) 01/05/2019 2056   BILIRUBINUR neg 05/24/2019 1504   KETONESUR 20 (A) 01/05/2019 2056   PROTEINUR Positive (A) 05/24/2019 1504   PROTEINUR 30 (A) 01/05/2019 2056   UROBILINOGEN 0.2 05/24/2019 1504   UROBILINOGEN 0.2 03/27/2010 1655   NITRITE neg 05/24/2019 1504   NITRITE NEGATIVE 01/05/2019 2056   LEUKOCYTESUR Negative 05/24/2019 1504   LEUKOCYTESUR SMALL (A) 01/05/2019 2056    Lab Results  Component Value Date   BACTERIA RARE (A) 01/05/2019    Pertinent Imaging:  Results for orders placed during the hospital encounter of 01/04/19  DG Abd 1 View   Narrative CLINICAL DATA:  Left flank pain.  History of left ureteral calculus.  EXAM: ABDOMEN - 1 VIEW  COMPARISON:  CT scan 01/02/2019  FINDINGS: Stable appearing position of the left  ureteral calculus located adjacent to the L4 vertebral body. Stable lower pole right renal calculus.  The bowel gas pattern is unremarkable. Scattered air in stool noted in the colon. No free air.  IMPRESSION: 1. Stable mid left ureteral calculus. 2. Right lower pole renal calculus. 3. Unremarkable bowel gas pattern.   Electronically Signed   By: Marijo Sanes M.D.   On: 01/04/2019 06:39    No results found for this or any previous visit. No results found for this or any previous visit. No results found for this or any previous visit. Results for orders placed during the hospital encounter of 02/26/19  US RENAL   Narrative CLINICAL DATA:  Nephrolithiasis.  Gross hematuria.  EXAM: RENAL / URINARY TRACT ULTRASOUND COMPLETE  COMPARISON:  CT, 01/02/2019  FINDINGS: Right Kidney:  Renal measurements: 13.0 x 7.2 x 7.5 cm = volume: 365.3 mL. Normal parenchymal echogenicity. No masses. Shadowing echogenic stone in the lower  pole, 8 mm. No hydronephrosis.  Left Kidney:  Renal measurements: 13.3 x 7.7 x 6.8 cm = volume: 364.8 mL. Normal parenchymal echogenicity. No masses. Shadowing echogenic stone in the lower pole, 10 mm. No hydronephrosis.  Bladder:  Mildly distended. There are calcifications projecting at the bladder base. No mass.  Other:  Enlarged prostate, volume calculated at 288 mL.  IMPRESSION: 1. No acute findings.  No hydronephrosis. 2. Nonobstructing stones in the lower pole of each kidney, on the right present on the prior CT, not seen on the prior CT in the left kidney. 3. Stones project at the bladder base.  No bladder mass. 4. Significant prosthetic enlargement.   Electronically Signed   By: Lajean Manes M.D.   On: 02/26/2019 15:53    No results found for this or any previous visit. No results found for this or any previous visit. Results for orders placed during the hospital encounter of 01/02/19  CT Renal Stone Study   Narrative CLINICAL DATA:  Left flank pain.  EXAM: CT ABDOMEN AND PELVIS WITHOUT CONTRAST  TECHNIQUE: Multidetector CT imaging of the abdomen and pelvis was performed following the standard protocol without IV contrast.  COMPARISON:  03/27/2010  FINDINGS: Lower chest: 10 mm lobular nodule in the left lower lobe is stable compared to chest CT of 08/11/2018. This nodule measured 9 mm on the cardiac CT of 10/17/2017 and was 5 mm on a chest CT of 02/11/2012.  Hepatobiliary: No suspicious focal abnormality within the liver parenchyma. There is no evidence for gallstones, gallbladder wall thickening, or pericholecystic fluid. No intrahepatic or extrahepatic biliary dilation.  Pancreas: No focal mass lesion. No dilatation of the main duct. No intraparenchymal cyst. No peripancreatic edema.  Spleen: No splenomegaly. No focal mass lesion.  Adrenals/Urinary Tract: No adrenal nodule or mass. 5 mm nonobstructing stone noted lower pole right kidney. No  right ureteral stone. No secondary changes in the right kidney or ureter. No left renal stones although there is mild left hydronephrosis. 7 x 7 x 4 mm stone identified in the proximal left ureter (axial 46/series 2). No other left urinary stone. 2.1 cm water density lesion lower pole left kidney is compatible with a cyst. No bladder stone.  Stomach/Bowel: Small to moderate hiatal hernia. Duodenum is normally positioned as is the ligament of Treitz. No small bowel wall thickening. No small bowel dilatation. The terminal ileum is normal. The appendix is best seen on coronal images and is unremarkable. No gross colonic mass. No colonic wall thickening. Diverticular changes are noted in the  left colon without evidence of diverticulitis.  Vascular/Lymphatic: There is abdominal aortic atherosclerosis without aneurysm. There is no gastrohepatic or hepatoduodenal ligament lymphadenopathy. No retroperitoneal or mesenteric lymphadenopathy. No pelvic sidewall lymphadenopathy.  Reproductive: Prostate gland is markedly enlarged.  Other: No intraperitoneal free fluid.  Musculoskeletal: No worrisome lytic or sclerotic osseous abnormality.  IMPRESSION: 1. 7 x 7 x 4 mm stone identified in the proximal left ureter with mild left hydronephrosis. 2. 5 mm nonobstructing right renal stone. No other urinary stone disease evident on today's study. 3. 10 mm lobular nodule left lower lobe has shown slow growth comparing back over almost 7 years to a study from 2014. Given the growth characteristics, this is probably benign. Conservatively, follow-up CT chest in 12 months could be used to reassess. 4. Small to moderate hiatal hernia. 5. Marked prostatomegaly. 6.  Aortic Atherosclerois (ICD10-170.0)   Electronically Signed   By: Misty Stanley M.D.   On: 01/02/2019 10:53     Assessment & Plan:    1. Benign prostatic hyperplasia with urinary obstruction -improved after simple prostactomy  2.  Benign prostatic hyperplasia with lower urinary tract symptoms, symptom details unspecified -improved after simple prostatectomy -Patient instructed to drink more water and stop drinking grapefruit juice as this is likely causing his dysuria - BLADDER SCAN AMB NON-IMAGING - POCT urinalysis dipstick  3. Urinary urgency -We will trial mirabegron 25mg  daily -RTC 4-6 weeks   Return in about 6 weeks (around 07/05/2019).  [name], MD  [facility] [place]

## 2019-05-26 ENCOUNTER — Ambulatory Visit: Payer: PPO | Admitting: Urology

## 2019-05-26 ENCOUNTER — Other Ambulatory Visit: Payer: Self-pay

## 2019-05-26 ENCOUNTER — Encounter (INDEPENDENT_AMBULATORY_CARE_PROVIDER_SITE_OTHER): Payer: PPO | Admitting: Ophthalmology

## 2019-05-26 DIAGNOSIS — H33302 Unspecified retinal break, left eye: Secondary | ICD-10-CM

## 2019-05-26 DIAGNOSIS — H35373 Puckering of macula, bilateral: Secondary | ICD-10-CM

## 2019-05-26 DIAGNOSIS — H43813 Vitreous degeneration, bilateral: Secondary | ICD-10-CM | POA: Diagnosis not present

## 2019-06-23 DIAGNOSIS — Z20822 Contact with and (suspected) exposure to covid-19: Secondary | ICD-10-CM | POA: Diagnosis not present

## 2019-06-24 DIAGNOSIS — Z20822 Contact with and (suspected) exposure to covid-19: Secondary | ICD-10-CM | POA: Diagnosis not present

## 2019-07-19 ENCOUNTER — Ambulatory Visit: Payer: PPO | Admitting: Urology

## 2019-07-19 ENCOUNTER — Encounter: Payer: Self-pay | Admitting: Urology

## 2019-07-19 ENCOUNTER — Other Ambulatory Visit: Payer: Self-pay

## 2019-07-19 ENCOUNTER — Ambulatory Visit (INDEPENDENT_AMBULATORY_CARE_PROVIDER_SITE_OTHER): Payer: PPO | Admitting: Urology

## 2019-07-19 VITALS — BP 131/81 | HR 74 | Temp 98.1°F | Ht 72.0 in | Wt 250.0 lb

## 2019-07-19 DIAGNOSIS — N138 Other obstructive and reflux uropathy: Secondary | ICD-10-CM

## 2019-07-19 DIAGNOSIS — R3915 Urgency of urination: Secondary | ICD-10-CM

## 2019-07-19 DIAGNOSIS — N401 Enlarged prostate with lower urinary tract symptoms: Secondary | ICD-10-CM | POA: Diagnosis not present

## 2019-07-19 DIAGNOSIS — N5201 Erectile dysfunction due to arterial insufficiency: Secondary | ICD-10-CM

## 2019-07-19 MED ORDER — TADALAFIL 20 MG PO TABS: 20.0000 mg | ORAL_TABLET | Freq: Every day | ORAL | 5 refills | Status: DC | PRN

## 2019-07-19 NOTE — Progress Notes (Signed)

## 2019-07-19 NOTE — Progress Notes (Signed)
07/19/2019 8:55 AM   James Norris Sep 28, 1950 [id number]  Referring provider: Redmond School, MD [address] [place],  [place] [address]  Urinary urgency  HPI: James Norris is a [phone] here for followup for BPH and urinary urgency. He is doing better since last visit. Off mirabegron. He is having dribbling.  NO urgency. No frequency. NOp hematuria. He continues to have issues getting and maintaining an erection. He has tried sildenafil which failed to give him a firm erection.    PMH: Past Medical History:  Diagnosis Date  . Anxiety   . GERD (gastroesophageal reflux disease)   . Hyperlipemia   . Kidney stone   . Nephrolithiasis 01/06/2019  . Sleep apnea    cpap    Surgical History: Past Surgical History:  Procedure Laterality Date  . BALLOON DILATION N/A 03/11/2012   Procedure: BALLOON DILATION;  Surgeon: Rogene Houston, MD;  [facility];  Service: Endoscopy;  Laterality: N/A;  . COLONOSCOPY WITH ESOPHAGOGASTRODUODENOSCOPY (EGD) N/A 03/11/2012   Procedure: COLONOSCOPY WITH ESOPHAGOGASTRODUODENOSCOPY (EGD);  Surgeon: Rogene Houston, MD;  [facility];  Service: Endoscopy;  Laterality: N/A;  1200  . CYSTOSCOPY W/ RETROGRADES Left 01/25/2019   Procedure: CYSTOSCOPY WITH LEFT RETROGRADE PYELOGRAM;  Surgeon: Cleon Gustin, MD;  [facility];  Service: Urology;  Laterality: Left;  . CYSTOSCOPY W/ URETERAL STENT REMOVAL Left 01/25/2019   Procedure: CYSTOSCOPY WITH LEFT URETERAL STENT REMOVAL;  Surgeon: Cleon Gustin, MD;  [facility];  Service: Urology;  Laterality: Left;  . CYSTOSCOPY/URETEROSCOPY/HOLMIUM LASER/STENT PLACEMENT Left 01/06/2019   Procedure: CYSTOSCOPY/URETEROSCOPY//STENT PLACEMENT WITH LEFT RETROGRADE PYELOGRAM;  Surgeon: Cleon Gustin, MD;  [facility];  Service: Urology;  Laterality: Left;  . ESOPHAGEAL DILATION N/A 02/15/2015   Procedure: ESOPHAGEAL DILATION;  Surgeon: Rogene Houston, MD;  [facility];  Service: Endoscopy;  Laterality: N/A;  . ESOPHAGOGASTRODUODENOSCOPY N/A 02/15/2015   Procedure: ESOPHAGOGASTRODUODENOSCOPY (EGD);  Surgeon: Rogene Houston, MD;  [facility];  Service: Endoscopy;  Laterality: N/A;  2:00  . EXTRACORPOREAL SHOCK WAVE LITHOTRIPSY Left 01/04/2019   Procedure: EXTRACORPOREAL SHOCK WAVE LITHOTRIPSY (ESWL);  Surgeon: Alexis Frock, MD;  [facility];  Service: Urology;  Laterality: Left;  . EYE SURGERY     lasik- bilaterally  . TONSILLECTOMY    . URETEROSCOPY Left 01/25/2019   Procedure: DIAGNOSTIC LEFT URETEROSCOPY;  Surgeon: Cleon Gustin, MD;  [facility];  Service: Urology;  Laterality: Left;  . XI ROBOTIC ASSISTED SIMPLE PROSTATECTOMY N/A 04/05/2019   Procedure: XI ROBOTIC ASSISTED SIMPLE PROSTATECTOMY;  Surgeon: Cleon Gustin, MD;  [facility];  Service: Urology;  Laterality: N/A;  3 HRS    Home Medications:  Allergies as of 07/19/2019   No Known Allergies     Medication List       Accurate as of July 19, 2019  8:55 AM. If you have any questions, ask your nurse or doctor.        STOP taking these medications   ciprofloxacin 500 MG tablet Commonly known as: CIPRO Stopped by: [name], MD   HYDROcodone-acetaminophen 5-325 MG tablet Commonly known as: Norco Stopped by: [name], MD   mirabegron ER 25 MG Tb24 tablet Commonly known as: MYRBETRIQ Stopped by: [name], MD   oxybutynin 5 MG tablet Commonly known as: DITROPAN Stopped by: [name], MD   sulfamethoxazole-trimethoprim 800-160 MG tablet Commonly known as: BACTRIM DS Stopped by: [name], MD  TAKE these medications   ALPRAZolam 0.5 MG tablet Commonly known as: XANAX Take 0.5 mg by mouth 3 (three) times daily as needed for anxiety.   atorvastatin 20 MG tablet Commonly known as: LIPITOR Take 20 mg by mouth daily.   ferrous sulfate 325 (65 FE) MG EC tablet Take 325 mg by mouth daily with  breakfast.   pantoprazole 40 MG tablet Commonly known as: PROTONIX TAKE 1 TABLET BY MOUTH ONCE DAILY BEFORE BREAKFAST   sildenafil 20 MG tablet Commonly known as: REVATIO Take 20 mg by mouth 3 (three) times daily.       Allergies: No Known Allergies  Family History: Family History  Problem Relation Age of Onset  . Dementia Mother   . Breast cancer Mother   . Coronary artery disease Mother   . Coronary artery disease Father   . Hypertension Father   . Diabetes type II Father   . Hyperlipidemia Father   . GER disease Father     Social History:  reports that he quit smoking about 8 years ago. His smoking use included cigarettes. He smoked 1.50 packs per day. He has never used smokeless tobacco. He reports current alcohol use. He reports that he does not use drugs.  ROS: All other review of systems were reviewed and are negative except what is noted above in HPI  Physical Exam: BP 131/81   Pulse 74   Temp 98.1 F (36.7 C)   Ht 6' (1.829 m)   Wt 250 lb (113.4 kg)   BMI 33.91 kg/m   Constitutional:  Alert and oriented, No acute distress. HEENT: [place] AT, moist mucus membranes.  Trachea midline, no masses. Cardiovascular: No clubbing, cyanosis, or edema. Respiratory: Normal respiratory effort, no increased work of breathing. GI: Abdomen is soft, nontender, nondistended, no abdominal masses GU: No CVA tenderness.  Lymph: No cervical or inguinal lymphadenopathy. Skin: No rashes, bruises or suspicious lesions. Neurologic: Grossly intact, no focal deficits, moving all 4 extremities. Psychiatric: Normal mood and affect.  Laboratory Data: Lab Results  Component Value Date   WBC 12.4 (H) 04/08/2019   HGB 14.9 04/08/2019   HCT 45.3 04/08/2019   MCV 88.8 04/08/2019   PLT 236 04/08/2019    Lab Results  Component Value Date   CREATININE 0.94 04/08/2019    No results found for: PSA  No results found for: TESTOSTERONE  No results found for: HGBA1C  Urinalysis      Component Value Date/Time   COLORURINE YELLOW 01/05/2019 2056   APPEARANCEUR CLOUDY (A) 01/05/2019 2056   LABSPEC 1.017 01/05/2019 2056   PHURINE 5.0 01/05/2019 2056   GLUCOSEU NEGATIVE 01/05/2019 2056   HGBUR LARGE (A) 01/05/2019 2056   BILIRUBINUR neg 05/24/2019 1504   KETONESUR 20 (A) 01/05/2019 2056   PROTEINUR Positive (A) 05/24/2019 1504   PROTEINUR 30 (A) 01/05/2019 2056   UROBILINOGEN 0.2 05/24/2019 1504   UROBILINOGEN 0.2 03/27/2010 1655   NITRITE neg 05/24/2019 1504   NITRITE NEGATIVE 01/05/2019 2056   LEUKOCYTESUR Negative 05/24/2019 1504   LEUKOCYTESUR SMALL (A) 01/05/2019 2056    Lab Results  Component Value Date   BACTERIA RARE (A) 01/05/2019    Pertinent Imaging:  Results for orders placed during the hospital encounter of 01/04/19  DG Abd 1 View  Narrative CLINICAL DATA:  Left flank pain.  History of left ureteral calculus.  EXAM: ABDOMEN - 1 VIEW  COMPARISON:  CT scan 01/02/2019  FINDINGS: Stable appearing position of the left ureteral calculus located adjacent to  the L4 vertebral body. Stable lower pole right renal calculus.  The bowel gas pattern is unremarkable. Scattered air in stool noted in the colon. No free air.  IMPRESSION: 1. Stable mid left ureteral calculus. 2. Right lower pole renal calculus. 3. Unremarkable bowel gas pattern.   Electronically Signed By: Marijo Sanes M.D. On: 01/04/2019 06:39  No results found for this or any previous visit.  No results found for this or any previous visit.  No results found for this or any previous visit.  Results for orders placed during the hospital encounter of 02/26/19  US RENAL  Narrative CLINICAL DATA:  Nephrolithiasis.  Gross hematuria.  EXAM: RENAL / URINARY TRACT ULTRASOUND COMPLETE  COMPARISON:  CT, 01/02/2019  FINDINGS: Right Kidney:  Renal measurements: 13.0 x 7.2 x 7.5 cm = volume: 365.3 mL. Normal parenchymal echogenicity. No masses. Shadowing echogenic stone  in the lower pole, 8 mm. No hydronephrosis.  Left Kidney:  Renal measurements: 13.3 x 7.7 x 6.8 cm = volume: 364.8 mL. Normal parenchymal echogenicity. No masses. Shadowing echogenic stone in the lower pole, 10 mm. No hydronephrosis.  Bladder:  Mildly distended. There are calcifications projecting at the bladder base. No mass.  Other:  Enlarged prostate, volume calculated at 288 mL.  IMPRESSION: 1. No acute findings.  No hydronephrosis. 2. Nonobstructing stones in the lower pole of each kidney, on the right present on the prior CT, not seen on the prior CT in the left kidney. 3. Stones project at the bladder base.  No bladder mass. 4. Significant prosthetic enlargement.   Electronically Signed By: Lajean Manes M.D. On: 02/26/2019 15:53  No results found for this or any previous visit.  No results found for this or any previous visit.  Results for orders placed during the hospital encounter of 01/02/19  CT Renal Stone Study  Narrative CLINICAL DATA:  Left flank pain.  EXAM: CT ABDOMEN AND PELVIS WITHOUT CONTRAST  TECHNIQUE: Multidetector CT imaging of the abdomen and pelvis was performed following the standard protocol without IV contrast.  COMPARISON:  03/27/2010  FINDINGS: Lower chest: 10 mm lobular nodule in the left lower lobe is stable compared to chest CT of 08/11/2018. This nodule measured 9 mm on the cardiac CT of 10/17/2017 and was 5 mm on a chest CT of 02/11/2012.  Hepatobiliary: No suspicious focal abnormality within the liver parenchyma. There is no evidence for gallstones, gallbladder wall thickening, or pericholecystic fluid. No intrahepatic or extrahepatic biliary dilation.  Pancreas: No focal mass lesion. No dilatation of the main duct. No intraparenchymal cyst. No peripancreatic edema.  Spleen: No splenomegaly. No focal mass lesion.  Adrenals/Urinary Tract: No adrenal nodule or mass. 5 mm nonobstructing stone noted lower pole right  kidney. No right ureteral stone. No secondary changes in the right kidney or ureter. No left renal stones although there is mild left hydronephrosis. 7 x 7 x 4 mm stone identified in the proximal left ureter (axial 46/series 2). No other left urinary stone. 2.1 cm water density lesion lower pole left kidney is compatible with a cyst. No bladder stone.  Stomach/Bowel: Small to moderate hiatal hernia. Duodenum is normally positioned as is the ligament of Treitz. No small bowel wall thickening. No small bowel dilatation. The terminal ileum is normal. The appendix is best seen on coronal images and is unremarkable. No gross colonic mass. No colonic wall thickening. Diverticular changes are noted in the left colon without evidence of diverticulitis.  Vascular/Lymphatic: There is abdominal aortic atherosclerosis without  aneurysm. There is no gastrohepatic or hepatoduodenal ligament lymphadenopathy. No retroperitoneal or mesenteric lymphadenopathy. No pelvic sidewall lymphadenopathy.  Reproductive: Prostate gland is markedly enlarged.  Other: No intraperitoneal free fluid.  Musculoskeletal: No worrisome lytic or sclerotic osseous abnormality.  IMPRESSION: 1. 7 x 7 x 4 mm stone identified in the proximal left ureter with mild left hydronephrosis. 2. 5 mm nonobstructing right renal stone. No other urinary stone disease evident on today's study. 3. 10 mm lobular nodule left lower lobe has shown slow growth comparing back over almost 7 years to a study from 2014. Given the growth characteristics, this is probably benign. Conservatively, follow-up CT chest in 12 months could be used to reassess. 4. Small to moderate hiatal hernia. 5. Marked prostatomegaly. 6.  Aortic Atherosclerois (ICD10-170.0)   Electronically Signed By: Misty Stanley M.D. On: 01/02/2019 10:53   Assessment & Plan:    1. Benign prostatic hyperplasia with urinary obstruction -improved after simple  prostatectomy  2. Urinary urgency Fluid management,   3. Erectile dysfunction -we will trial tadalafil 20mg  prn   No follow-ups on file.  [name], MD  [facility] [place]

## 2019-07-19 NOTE — Patient Instructions (Signed)

## 2019-08-10 DIAGNOSIS — Z20822 Contact with and (suspected) exposure to covid-19: Secondary | ICD-10-CM | POA: Diagnosis not present

## 2019-08-11 DIAGNOSIS — Z20822 Contact with and (suspected) exposure to covid-19: Secondary | ICD-10-CM | POA: Diagnosis not present

## 2019-08-12 ENCOUNTER — Ambulatory Visit: Payer: PPO | Admitting: Internal Medicine

## 2019-09-16 ENCOUNTER — Ambulatory Visit (INDEPENDENT_AMBULATORY_CARE_PROVIDER_SITE_OTHER): Payer: PPO

## 2019-09-16 ENCOUNTER — Encounter: Payer: Self-pay | Admitting: Internal Medicine

## 2019-09-16 ENCOUNTER — Ambulatory Visit: Payer: PPO | Admitting: Internal Medicine

## 2019-09-16 ENCOUNTER — Other Ambulatory Visit: Payer: Self-pay

## 2019-09-16 VITALS — BP 130/70 | HR 72 | Temp 97.3°F | Ht 72.0 in | Wt 231.1 lb

## 2019-09-16 DIAGNOSIS — Z87891 Personal history of nicotine dependence: Secondary | ICD-10-CM

## 2019-09-16 DIAGNOSIS — R918 Other nonspecific abnormal finding of lung field: Secondary | ICD-10-CM

## 2019-09-16 DIAGNOSIS — F17201 Nicotine dependence, unspecified, in remission: Secondary | ICD-10-CM | POA: Diagnosis not present

## 2019-09-16 NOTE — Patient Instructions (Signed)
Order- CXR  Dx history of tobacco use   Please call if we can help

## 2019-09-16 NOTE — Progress Notes (Signed)
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 Progressive enlargement of left lower lobe nodule which currently measures 10 x 9 mm. This is concerning for primary bronchogenic neoplasm.  PET scan 06/16/2017- IMPRESSION: 1. Left lower lobe 1.0 cm solid pulmonary nodule is non hypermetabolic, more suggestive of a benign etiology. Recommend continued chest CT surveillance in 6-12 months, as a low-grade neoplasm cannot be excluded by PET  ------------------------------------------------------------------   08/12/2018- 68 year old male former heavy smoker followed for bilateral lung nodules with particular interest in left lower lobe nodule, complicated by BPH, Aortic Atherosclerosis, Hiatal Hernia with GERD, High cholesterol,  -----F/U after CT 08/11/2018 He has been hiking in [place] w/o problems. No chest complaints.  CT findings reviewed, consistent with benign process. Discussed possibility still that LLL nodule might eventually change . CT chest- 08/11/2018 IMPRESSION: 1. Interval resolution of the right upper lobe pulmonary nodule. This was likely an inflammatory or infectious lesion. 2. Interval decrease in size of the right lower lobe pulmonary nodule also likely inflammatory. 3. Stable left lower lobe pulmonary nodule consistent with benign process. 4. No mediastinal or hilar mass or adenopathy. 5. Stable moderate to large hiatal hernia.  09/16/19-  69 year old male former heavy smoker followed for bilateral lung nodules with particular interest in left lower lobe nodule, complicated by BPH, Aortic Atherosclerosis, Hiatal Hernia with GERD, High cholesterol,   -----Multiple lung nodules on CT Had 3 Phizer Covax On weight loss injectable med- down to 231 lbs Remains off cigarettes Had Prostate surgery for outlet obstruction Feels that he is breeathing well with little cough. Went climbing/ hiking in [place] last week.   ROS-see HPI   + = positive Constitutional:    weight loss, night sweats, fevers, chills, fatigue, lassitude. HEENT:    headaches, difficulty swallowing, tooth/dental problems, sore throat,       sneezing, itching, ear ache, nasal congestion, post nasal drip, snoring CV:    chest pain, orthopnea, PND, swelling in lower extremities, anasarca,                                              dizziness, palpitations Resp:   +shortness of breath with exertion or at rest.                productive cough,   non-productive cough, coughing up of blood.              change in color of mucus.  wheezing.   Skin:    rash or lesions. GI:  No-   heartburn, indigestion, abdominal pain, nausea, vomiting, diarrhea,                 change in bowel habits, loss of appetite GU: dysuria, change in color of urine, no urgency or frequency.   flank pain. MS:   joint pain, stiffness, decreased range of motion, back pain. Neuro-     nothing unusual Psych:  change in mood or affect.  depression or anxiety.   memory loss.  OBJ- Physical Exam General- Alert, Oriented, Affect-appropriate, Distress- none acute, + obese Skin- rash-none, lesions- none, excoriation- none  Lymphadenopathy- none Head- atraumatic            Eyes- Gross vision intact, PERRLA, conjunctivae and secretions clear            Ears- Hearing, canals-normal            Nose- Clear, no-Septal dev, mucus, polyps, erosion, perforation             Throat- Mallampati II-III , mucosa clear , drainage- none, tonsils- atrophic Neck- flexible , trachea midline, no stridor , thyroid nl, carotid no bruit Chest - symmetrical excursion , unlabored           Heart/CV- RRR , no murmur , no gallop  , no  rub, nl s1 s2                           - JVD- none , edema- none, stasis changes- none, varices- none           Lung- clear to P&A, wheeze- none, cough- none , dullness-none, rub- none           Chest wall-  Abd-  Br/ Gen/ Rectal- Not done, not indicated Extrem- cyanosis- none, clubbing, none, atrophy- none, strength- nl Neuro- grossly intact to observation

## 2019-09-28 NOTE — Assessment & Plan Note (Signed)
He feels that breathing has improved as he remains off cigarettes. Weight loss also helps.

## 2019-09-28 NOTE — Assessment & Plan Note (Addendum)
Acting benign. Plan- because of smoking hx, we will follow with CXR now- discussed.

## 2019-10-19 DIAGNOSIS — Z0001 Encounter for general adult medical examination with abnormal findings: Secondary | ICD-10-CM | POA: Diagnosis not present

## 2019-10-19 DIAGNOSIS — E781 Pure hyperglyceridemia: Secondary | ICD-10-CM | POA: Diagnosis not present

## 2019-10-19 DIAGNOSIS — Z1331 Encounter for screening for depression: Secondary | ICD-10-CM | POA: Diagnosis not present

## 2019-10-19 DIAGNOSIS — K219 Gastro-esophageal reflux disease without esophagitis: Secondary | ICD-10-CM | POA: Diagnosis not present

## 2019-10-19 DIAGNOSIS — J439 Emphysema, unspecified: Secondary | ICD-10-CM | POA: Diagnosis not present

## 2019-10-19 DIAGNOSIS — D229 Melanocytic nevi, unspecified: Secondary | ICD-10-CM | POA: Diagnosis not present

## 2019-10-19 DIAGNOSIS — Z1389 Encounter for screening for other disorder: Secondary | ICD-10-CM | POA: Diagnosis not present

## 2019-10-19 DIAGNOSIS — E6609 Other obesity due to excess calories: Secondary | ICD-10-CM | POA: Diagnosis not present

## 2019-10-19 DIAGNOSIS — N400 Enlarged prostate without lower urinary tract symptoms: Secondary | ICD-10-CM | POA: Diagnosis not present

## 2019-10-19 DIAGNOSIS — L821 Other seborrheic keratosis: Secondary | ICD-10-CM | POA: Diagnosis not present

## 2019-10-19 DIAGNOSIS — Z6834 Body mass index (BMI) 34.0-34.9, adult: Secondary | ICD-10-CM | POA: Diagnosis not present

## 2019-10-19 DIAGNOSIS — K5730 Diverticulosis of large intestine without perforation or abscess without bleeding: Secondary | ICD-10-CM | POA: Diagnosis not present

## 2019-10-28 ENCOUNTER — Other Ambulatory Visit (HOSPITAL_COMMUNITY): Payer: Self-pay | Admitting: Internal Medicine

## 2019-10-28 ENCOUNTER — Other Ambulatory Visit: Payer: Self-pay | Admitting: Internal Medicine

## 2019-11-03 DIAGNOSIS — Z20822 Contact with and (suspected) exposure to covid-19: Secondary | ICD-10-CM | POA: Diagnosis not present

## 2019-11-04 ENCOUNTER — Other Ambulatory Visit (INDEPENDENT_AMBULATORY_CARE_PROVIDER_SITE_OTHER): Payer: Self-pay | Admitting: Internal Medicine

## 2019-11-30 DIAGNOSIS — Z23 Encounter for immunization: Secondary | ICD-10-CM | POA: Diagnosis not present

## 2019-12-15 NOTE — Progress Notes (Unsigned)
Cardiology Office Note  Date: 12/16/2019   ID: James Norris, DOB 1950/11/20, MRN [id number]  PCP:  Redmond School, MD  Cardiologist:  No primary care provider on file. Electrophysiologist:  None   Chief Complaint: Follow up CAD  History of Present Illness: James Norris is a 69 y.o. male with a history of Coronary artery calcification on CT, Hyperlipidemia, lung nodules, GERD, OSA, HLD, moderate hiatal hernia, pulmonary hypertension, history of prostate surgery.  Last encounter with Bernerd Pho, [place] 11/19/2017.  Reported he had been well overall.  He denied any chest pain, dyspnea on exertion, orthopnea, PND, lower extremity edema, palpitations.  He did stop smoking 4 years prior.  He was planning on going back to the [facility] to increase exercise due to steady weight gain.  He was continuing atorvastatin 40 mg daily.  Lipid profile and LFTs were ordered to be done in 6 to 8 weeks.  He was following pulmonology for pulmonary nodule in LLL.  He recently saw his primary care provider for annual back wellness visit.  He was sent by her primary care provider for evaluation of cardiac risk factors and history of coronary artery calcifications on CT scan with strong family history of heart disease in his family and his father who was a physician in the area.  He has been taking Crestor 10 mg 3 times per week only.  Most recent LDL was 88.  Significant history of smoking up to 3 packs/day in the past.  Complains of some dyspnea on exertion and activity intolerance.  Denies any CVA or TIA-like symptoms, palpitations or arrhythmias, orthostatic symptoms, PND, orthopnea, lower extremity edema, claudication-like symptoms, DVT or PE-like symptoms.  Blood pressure elevated on arrival today at 140/80.  Recent visit with Dr. Gerarda Fraction his primary care physician blood pressure was 120/70.  Past Medical History:  Diagnosis Date  . Anxiety   . GERD (gastroesophageal reflux disease)   . Hyperlipemia   .  Kidney stone   . Nephrolithiasis 01/06/2019  . Sleep apnea    cpap    Past Surgical History:  Procedure Laterality Date  . BALLOON DILATION N/A 03/11/2012   Procedure: BALLOON DILATION;  Surgeon: Rogene Houston, MD;  [facility];  Service: Endoscopy;  Laterality: N/A;  . COLONOSCOPY WITH ESOPHAGOGASTRODUODENOSCOPY (EGD) N/A 03/11/2012   Procedure: COLONOSCOPY WITH ESOPHAGOGASTRODUODENOSCOPY (EGD);  Surgeon: Rogene Houston, MD;  [facility];  Service: Endoscopy;  Laterality: N/A;  1200  . CYSTOSCOPY W/ RETROGRADES Left 01/25/2019   Procedure: CYSTOSCOPY WITH LEFT RETROGRADE PYELOGRAM;  Surgeon: Cleon Gustin, MD;  Location: [facility];  Service: Urology;  Laterality: Left;  . CYSTOSCOPY W/ URETERAL STENT REMOVAL Left 01/25/2019   Procedure: CYSTOSCOPY WITH LEFT URETERAL STENT REMOVAL;  Surgeon: Cleon Gustin, MD;  Location: [facility];  Service: Urology;  Laterality: Left;  . CYSTOSCOPY/URETEROSCOPY/HOLMIUM LASER/STENT PLACEMENT Left 01/06/2019   Procedure: CYSTOSCOPY/URETEROSCOPY//STENT PLACEMENT WITH LEFT RETROGRADE PYELOGRAM;  Surgeon: Cleon Gustin, MD;  Location: [facility];  Service: Urology;  Laterality: Left;  . ESOPHAGEAL DILATION N/A 02/15/2015   Procedure: ESOPHAGEAL DILATION;  Surgeon: Rogene Houston, MD;  [facility];  Service: Endoscopy;  Laterality: N/A;  . ESOPHAGOGASTRODUODENOSCOPY N/A 02/15/2015   Procedure: ESOPHAGOGASTRODUODENOSCOPY (EGD);  Surgeon: Rogene Houston, MD;  [facility];  Service: Endoscopy;  Laterality: N/A;  2:00  . EXTRACORPOREAL SHOCK WAVE LITHOTRIPSY Left 01/04/2019   Procedure: EXTRACORPOREAL SHOCK WAVE LITHOTRIPSY (ESWL);  Surgeon: Alexis Frock, MD;  Location: [facility];  Service: Urology;  Laterality: Left;  . EYE SURGERY     lasik- bilaterally  . TONSILLECTOMY    . URETEROSCOPY Left 01/25/2019   Procedure: DIAGNOSTIC LEFT URETEROSCOPY;  Surgeon: Cleon Gustin, MD;  Location: [facility];  Service:  Urology;  Laterality: Left;  . XI ROBOTIC ASSISTED SIMPLE PROSTATECTOMY N/A 04/05/2019   Procedure: XI ROBOTIC ASSISTED SIMPLE PROSTATECTOMY;  Surgeon: Cleon Gustin, MD;  Location: [facility];  Service: Urology;  Laterality: N/A;  3 HRS    Current Outpatient Medications  Medication Sig Dispense Refill  . ALPRAZolam (XANAX) 0.5 MG tablet Take 0.5 mg by mouth 3 (three) times daily as needed for anxiety.     . ferrous sulfate 325 (65 FE) MG EC tablet Take 325 mg by mouth daily with breakfast.     . metFORMIN (GLUCOPHAGE-XR) 500 MG 24 hr tablet SMARTSIG:1 Tablet(s) By Mouth Every Evening    . pantoprazole (PROTONIX) 40 MG tablet TAKE 1 TABLET BY MOUTH ONCE DAILY BEFORE BREAKFAST 90 tablet 0  . rosuvastatin (CRESTOR) 10 MG tablet Take 10 mg by mouth 3 (three) times a week.    . sildenafil (REVATIO) 20 MG tablet Take 20 mg by mouth 3 (three) times daily.    . tadalafil (CIALIS) 20 MG tablet Take 1 tablet (20 mg total) by mouth daily as needed for erectile dysfunction. 10 tablet 5  . WEGOVY 0.5 MG/0.5ML SOAJ INJECT 0.5 MG SUBCUTANEOUSLY ONCE WEEKLY ON THE same DAY of each WEEK     No current facility-administered medications for this visit.   Allergies:  Patient has no known allergies.   Social History: The patient  reports that he quit smoking about 9 years ago. His smoking use included cigarettes. He smoked 1.50 packs per day. He has never used smokeless tobacco. He reports current alcohol use. He reports that he does not use drugs.   Family History: The patient's family history includes Breast cancer in his mother; Coronary artery disease in his father and mother; Dementia in his mother; Diabetes type II in his father; GER disease in his father; Hyperlipidemia in his father; Hypertension in his father.   ROS:  Please see the history of present illness. Otherwise, complete review of systems is positive for none.  All other systems are reviewed and negative.   Physical Exam: VS:  BP 140/80    Pulse 75   Ht 5\' 11"  (1.803 m)   Wt 234 lb 9.6 oz (106.4 kg)   SpO2 98%   BMI 32.72 kg/m , BMI Body mass index is 32.72 kg/m.  Wt Readings from Last 3 Encounters:  12/16/19 234 lb 9.6 oz (106.4 kg)  09/16/19 231 lb 1.6 oz (104.8 kg)  07/19/19 250 lb (113.4 kg)    General: Patient appears comfortable at rest. Neck: Supple, no elevated JVP or carotid bruits, no thyromegaly. Lungs: Clear to auscultation, nonlabored breathing at rest. Cardiac: Regular rate and rhythm, no S3 or significant systolic murmur, no pericardial rub. Extremities: No pitting edema, distal pulses 2+. Skin: Warm and dry. Musculoskeletal: No kyphosis. Neuropsychiatric: Alert and oriented x3, affect grossly appropriate.  ECG:  An ECG dated 12/16/2019 was personally reviewed today and demonstrated:  Normal sinus rhythm right bundle branch block rate of 72.  Recent Labwork: 04/08/2019: ALT 26; AST 18; BUN 18; Creatinine, Ser 0.94; Hemoglobin 14.9; Platelets 236; Potassium 4.2; Sodium 138  No results found for: CHOL, TRIG, HDL, CHOLHDL, VLDL, LDLCALC, LDLDIRECT  Other Studies Reviewed Today:  Echocardiogram:  07/2017 Study Conclusions  - Left ventricle: The cavity size was normal. Wall thickness was increased in a pattern of mild LVH. Systolic function was normal. The estimated ejection fraction was in the range of 60% to 65%. Wall motion was normal; there were no regional wall motion abnormalities. Left ventricular diastolic function parameters were normal for the patient&'s age. - Aortic valve: Mildly calcified annulus. Probably trileaflet; mildly calcified leaflets. - Mitral valve: Mildly calcified annulus. There was trivial regurgitation. - Left atrium: The atrium was at the upper limits of normal in size. - Right atrium: Central venous pressure (est): 3 mm Hg. - Atrial septum: No defect or patent foramen ovale was identified. - Tricuspid valve: There was trivial regurgitation. -  Pulmonary arteries: PA peak pressure: 17 mm Hg (S). - Pericardium, extracardiac: There was no pericardial effusion.  Coronary CT: 10/17/2017 Aorta: Normal size. No calcifications. No dissection.  Aortic Valve: Trileaflet. Mild calcification of the aortic valve.  Coronary Arteries: Normal coronary origin. Right dominance.  RCA is a small, co-dominant artery. There is mild (1-24%) mixed calcified and noncalcified plaque proximally.  Left main is a large artery that gives rise to LAD and LCX arteries. There is mild (1-24%) calcified plaque.  LAD is a large vessel that gives rise to a small D1 and normal D2. It has mild (1-24%) calcified plaque in the mid LAD just distal to D2. D2 has mild (1-24%) calcified plaque proximally.  LCX is a co-dominant artery that gives rise a large OM1 and small OM2 branches. There is mild (1-24%) diffuse mixed plaque in the LCX and OM1.  Other findings:  Normal pulmonary vein drainage into the left atrium.  Normal let atrial appendage without a thrombus.  Normal size of the pulmonary artery.  IMPRESSION: 1. Coronary calcium score of 221. This was 62 percentile for age and sex matched control.  2. Normal coronary origin with right dominance.  3. Mild, non-obstructive plaque in all coronary distributions.  4. Recommend aggressive risk factor modification and high-potency statin.    Assessment and Plan:  1. Coronary artery calcification seen on CT scan   2. Mixed hyperlipidemia   3. Pulmonary nodules   4. Pulmonary hypertension, unspecified ([place])     1. Coronary artery calcification seen on CT scan Previous coronary artery calcification on CT scan and 2019 with calcium score of 221 placing him in a 92 percentile for age and sex matched control.  Mild nonobstructive plaque in all coronary distributions.: LAD was a large vessel giving rise to small D1 normal D2.  It had mild 1 to 24% plaque in the mid LAD just distal  to D2.  D2 you had mild 1 to 24% calcified plaque proximally.  LCx codominant artery giving rise to a large OM1 and small OM 2 branches.  Mild 1 to 24% diffuse mixed plaques and LCx and OM1.  RCA small, codominant artery with mild 1 to 24% mixed calcified and noncalcified plaque proximally.    Recommendation was for aggressive risk factor modification high potency statins.  Recently complaining of increased dyspnea on exertion.  This could be an anginal equivalent.  Please get a Lexiscan nuclear stress test.  2. Mixed hyperlipidemia Currently taking Crestor 10 mg 3 times weekly on Mondays, Wednesdays, and Fridays.  Recent lipid panel at PCP office on 10/20/2019: TC 154, TG 78, HDL 51, LDL 88.  Change Crestor to 10 mg p.o. daily.  3. Pulmonary nodule History of pulmonary nodule.  Recent CT of the chest on  04/08/2019 secondary to complaint of chest pain performed at [facility] emergency department.  A 10 mm left lower lobe pulmonary nodule was noted which had not appreciably changed since 2019 were shown to be PET negative.  Report stated it was likely benign.  4.  Pulmonary hypertension Currently taking sildenafil 20 mg p.o. 3 times daily for pulmonary hypertension.  Complaining of increased dyspnea recently.  Please get a follow-up echocardiogram.   5.  Screening for abdominal aortic aneurysm. Previous history of smoking age greater than 4.  Please get a screening abdominal aortic ultrasound.  Medication Adjustments/Labs and Tests Ordered: Current medicines are reviewed at length with the patient today.  Concerns regarding medicines are outlined above.   Disposition: Follow-up with Dr. Domenic Polite or APP 4 to 6 weeks.  Signed, Levell July, NP 12/16/2019 1:36 PM    [facility] at [place], [place], [place] [address] Phone: [phone]; Fax: [phone]

## 2019-12-16 ENCOUNTER — Encounter: Payer: Self-pay | Admitting: *Deleted

## 2019-12-16 ENCOUNTER — Telehealth: Payer: Self-pay | Admitting: Family Medicine

## 2019-12-16 ENCOUNTER — Encounter: Payer: Self-pay | Admitting: Family Medicine

## 2019-12-16 ENCOUNTER — Ambulatory Visit: Payer: PPO | Admitting: Family Medicine

## 2019-12-16 VITALS — BP 140/80 | HR 75 | Ht 71.0 in | Wt 234.6 lb

## 2019-12-16 DIAGNOSIS — Z136 Encounter for screening for cardiovascular disorders: Secondary | ICD-10-CM | POA: Diagnosis not present

## 2019-12-16 DIAGNOSIS — I272 Pulmonary hypertension, unspecified: Secondary | ICD-10-CM | POA: Diagnosis not present

## 2019-12-16 DIAGNOSIS — E782 Mixed hyperlipidemia: Secondary | ICD-10-CM

## 2019-12-16 DIAGNOSIS — I2510 Atherosclerotic heart disease of native coronary artery without angina pectoris: Secondary | ICD-10-CM | POA: Diagnosis not present

## 2019-12-16 DIAGNOSIS — R918 Other nonspecific abnormal finding of lung field: Secondary | ICD-10-CM

## 2019-12-16 DIAGNOSIS — R0602 Shortness of breath: Secondary | ICD-10-CM | POA: Diagnosis not present

## 2019-12-16 MED ORDER — ROSUVASTATIN CALCIUM 10 MG PO TABS: 10.0000 mg | ORAL_TABLET | Freq: Every day | ORAL | 2 refills | Status: DC

## 2019-12-16 NOTE — Addendum Note (Signed)
Addended by: Merlene Laughter on: 12/16/2019 04:54 PM   Modules accepted: Orders

## 2019-12-16 NOTE — Patient Instructions (Addendum)
Medication Instructions:   Your physician has recommended you make the following change in your medication:   Increase rosuvastatin 10 mg to one by mouth daily  Continue other medications the same  Labwork:  None  Testing/Procedures: Your physician has requested that you have an echocardiogram. Echocardiography is a painless test that uses sound waves to create images of your heart. It provides your doctor with information about the size and shape of your heart and how well your heart's chambers and valves are working. This procedure takes approximately one hour. There are no restrictions for this procedure. Your physician has requested that you have a lexiscan myoview. For further information please visit [email or web address]. Please follow instruction sheet, as given.  Your physician has requested that you have an abdominal aorta duplex. During this test, an ultrasound is used to evaluate the aorta. Allow 30 minutes for this exam. Do not eat after midnight the day before and avoid carbonated beverages  Follow-Up:  Your physician recommends that you schedule a follow-up appointment in: 4-6 weeks.  Any Other Special Instructions Will Be Listed Below (If Applicable).  If you need a refill on your cardiac medications before your next appointment, please call your pharmacy.

## 2019-12-16 NOTE — Addendum Note (Signed)
Addended by: Merlene Laughter on: 12/16/2019 02:53 PM   Modules accepted: Orders

## 2019-12-24 ENCOUNTER — Encounter (HOSPITAL_COMMUNITY)
Admission: RE | Admit: 2019-12-24 | Discharge: 2019-12-24 | Disposition: A | Discharge status: Home / Self Care | Payer: PPO | Source: Ambulatory Visit | Attending: Family Medicine | Admitting: Family Medicine

## 2019-12-24 ENCOUNTER — Encounter (HOSPITAL_BASED_OUTPATIENT_CLINIC_OR_DEPARTMENT_OTHER)
Admission: RE | Admit: 2019-12-24 | Discharge: 2019-12-24 | Disposition: A | Discharge status: Home / Self Care | Payer: PPO | Source: Ambulatory Visit | Attending: Family Medicine | Admitting: Family Medicine

## 2019-12-24 ENCOUNTER — Ambulatory Visit (HOSPITAL_COMMUNITY)
Admission: RE | Admit: 2019-12-24 | Discharge: 2019-12-24 | Disposition: A | Discharge status: Home / Self Care | Payer: PPO | Source: Ambulatory Visit | Attending: Family Medicine | Admitting: Family Medicine

## 2019-12-24 ENCOUNTER — Other Ambulatory Visit: Payer: Self-pay

## 2019-12-24 ENCOUNTER — Encounter (HOSPITAL_COMMUNITY): Payer: Self-pay

## 2019-12-24 DIAGNOSIS — R0602 Shortness of breath: Secondary | ICD-10-CM | POA: Insufficient documentation

## 2019-12-24 DIAGNOSIS — I2510 Atherosclerotic heart disease of native coronary artery without angina pectoris: Secondary | ICD-10-CM | POA: Insufficient documentation

## 2019-12-24 LAB — ECHOCARDIOGRAM COMPLETE
Area-P 1/2: 3.27 cm2
S' Lateral: 2.6 cm

## 2019-12-24 LAB — NM MYOCAR MULTI W/SPECT W/WALL MOTION / EF
LV dias vol: 97 mL (ref 62–150)
LV sys vol: 42 mL
Peak HR: 93 {beats}/min
RATE: 0.32
Rest HR: 74 {beats}/min
SDS: 2
SRS: 1
SSS: 3
TID: 1.03

## 2019-12-24 MED ORDER — TECHNETIUM TC 99M TETROFOSMIN IV KIT
30.0000 | PACK | Freq: Once | INTRAVENOUS | Status: AC | PRN
  Administered 2019-12-24: 29.4 via INTRAVENOUS

## 2019-12-24 MED ORDER — TECHNETIUM TC 99M TETROFOSMIN IV KIT
10.0000 | PACK | Freq: Once | INTRAVENOUS | Status: AC | PRN
  Administered 2019-12-24: 11 via INTRAVENOUS

## 2019-12-24 MED ORDER — REGADENOSON 0.4 MG/5ML IV SOLN
INTRAVENOUS | Status: AC
  Administered 2019-12-24: 0.4 mg via INTRAVENOUS
  Filled 2019-12-24: qty 5

## 2019-12-24 MED ORDER — SODIUM CHLORIDE FLUSH 0.9 % IV SOLN
INTRAVENOUS | Status: AC
  Administered 2019-12-24: 10 mL via INTRAVENOUS
  Filled 2019-12-24: qty 10

## 2019-12-24 NOTE — Telephone Encounter (Signed)
error 

## 2019-12-24 NOTE — Progress Notes (Signed)
*  PRELIMINARY RESULTS* Echocardiogram 2D Echocardiogram has been performed.  James Norris 12/24/2019, 12:03 PM

## 2019-12-27 ENCOUNTER — Telehealth: Payer: Self-pay | Admitting: *Deleted

## 2019-12-27 NOTE — Telephone Encounter (Signed)
-----   Message from Verta Ellen., NP sent at 12/27/2019  7:48 AM EST ----- Please call the patient and let him know according to the doctor's interpretation of the stress test it was considered a low risk study..  Thank you

## 2019-12-27 NOTE — Telephone Encounter (Signed)
Patient informed. Copy sent to PCP °

## 2019-12-27 NOTE — Telephone Encounter (Signed)
-----   Message from Verta Ellen., NP sent at 12/24/2019  3:13 PM EST ----- Please call the patient and tell him the echocardiogram looks good his pumping function looks great.  Left ventricle is mildly more muscular than normal.  No leaking or narrowing valves.  Otherwise everything looks great

## 2020-01-19 ENCOUNTER — Ambulatory Visit (INDEPENDENT_AMBULATORY_CARE_PROVIDER_SITE_OTHER): Payer: PPO | Admitting: Urology

## 2020-01-19 ENCOUNTER — Encounter: Payer: Self-pay | Admitting: Urology

## 2020-01-19 ENCOUNTER — Other Ambulatory Visit: Payer: Self-pay

## 2020-01-19 VITALS — BP 144/77 | HR 75 | Temp 98.4°F | Ht 71.0 in | Wt 230.0 lb

## 2020-01-19 DIAGNOSIS — N5201 Erectile dysfunction due to arterial insufficiency: Secondary | ICD-10-CM | POA: Diagnosis not present

## 2020-01-19 DIAGNOSIS — R3915 Urgency of urination: Secondary | ICD-10-CM

## 2020-01-19 LAB — URINALYSIS, ROUTINE W REFLEX MICROSCOPIC
Bilirubin, UA: NEGATIVE
Glucose, UA: NEGATIVE
Leukocytes,UA: NEGATIVE
Nitrite, UA: NEGATIVE
Protein,UA: NEGATIVE
RBC, UA: NEGATIVE
Specific Gravity, UA: 1.025 (ref 1.005–1.030)
Urobilinogen, Ur: 0.2 mg/dL (ref 0.2–1.0)
pH, UA: 6 (ref 5.0–7.5)

## 2020-01-19 MED ORDER — TADALAFIL 20 MG PO TABS: 20.0000 mg | ORAL_TABLET | ORAL | 5 refills | Status: DC | PRN

## 2020-01-19 NOTE — Patient Instructions (Signed)
Erectile Dysfunction Erectile dysfunction (ED) is the inability to get or keep an erection in order to have sexual intercourse. ED is considered a symptom of an underlying disorder and not considered a disease. Erectile dysfunction may include:  Inability to get an erection.  Lack of enough hardness of the erection to allow penetration.  Loss of the erection before sex is finished. What are the causes? This condition may be caused by:  Certain medicines, such as: ? Pain relievers. ? Antihistamines. ? Antidepressants. ? Blood pressure medicines. ? Water pills (diuretics). ? Ulcer medicines. ? Muscle relaxants. ? Drugs.  Excessive drinking.  Psychological causes, such as: ? Anxiety. ? Depression. ? Sadness. ? Exhaustion. ? Performance fear. ? Stress.  Physical causes, such as: ? Artery problems. This may include diabetes, smoking, liver disease, or atherosclerosis. ? High blood pressure. ? Hormonal problems, such as low testosterone. ? Obesity. ? Nerve problems. This may include back or pelvic injuries, diabetes mellitus, multiple sclerosis, or Parkinson's disease. What are the signs or symptoms? Symptoms of this condition include:  Inability to get an erection.  Lack of enough hardness of the erection to allow penetration.  Loss of the erection before sex is finished.  Normal erections at some times, but with frequent unsatisfactory episodes.  Low sexual satisfaction in either partner due to erection problems.  A curved penis occurring with erection. The curve may cause pain or the penis may be too curved to allow for intercourse.  Never having nighttime erections. How is this diagnosed? This condition is often diagnosed by:  Performing a physical exam to find other diseases or specific problems with the penis.  Asking you detailed questions about the problem.  Performing blood tests to check for diabetes mellitus or to measure hormone levels.  Performing  other tests to check for underlying health conditions.  Performing an ultrasound exam to check for scarring.  Performing a test to check blood flow to the penis.  Doing a sleep study at home to measure nighttime erections. How is this treated? This condition may be treated by:  Medicine taken by mouth to help you achieve an erection (oral medicine).  Hormone replacement therapy to replace low testosterone levels.  Medicine that is injected into the penis. Your health care provider may instruct you how to give yourself these injections at home.  Vacuum pump. This is a pump with a ring on it. The pump and ring are placed on the penis and used to create pressure that helps the penis become erect.  Penile implant surgery. In this procedure, you may receive: ? An inflatable implant. This consists of cylinders, a pump, and a reservoir. The cylinders can be inflated with a fluid that helps to create an erection, and they can be deflated after intercourse. ? A semi-rigid implant. This consists of two silicone rubber rods. The rods provide some rigidity. They are also flexible, so the penis can both curve downward in its normal position and become straight for sexual intercourse.  Blood vessel surgery, to improve blood flow to the penis. During this procedure, a blood vessel from a different part of the body is placed into the penis to allow blood to flow around (bypass) damaged or blocked blood vessels.  Lifestyle changes, such as exercising more, losing weight, and quitting smoking. Follow these instructions at home: Medicines  Take over-the-counter and prescription medicines only as told by your health care provider. Do not increase the dosage without first discussing it with your health care   provider.  If you are using self-injections, perform injections as directed by your health care provider. Make sure to avoid any veins that are on the surface of the penis. After giving an injection,  apply pressure to the injection site for 5 minutes.   General instructions  Exercise regularly, as directed by your health care provider. Work with your health care provider to lose weight, if needed.  Do not use any products that contain nicotine or tobacco, such as cigarettes and e-cigarettes. If you need help quitting, ask your health care provider.  Before using a vacuum pump, read the instructions that come with the pump and discuss any questions with your health care provider.  Keep all follow-up visits as told by your health care provider. This is important. Contact a health care provider if:  You feel nauseous.  You vomit. Get help right away if:  You are taking oral or injectable medicines and you have an erection that lasts longer than 4 hours. If your health care provider is unavailable, go to the nearest emergency room for evaluation. An erection that lasts much longer than 4 hours can result in permanent damage to your penis.  You have severe pain in your groin or abdomen.  You develop redness or severe swelling of your penis.  You have redness spreading up into your groin or lower abdomen.  You are unable to urinate.  You experience chest pain or a rapid heart beat (palpitations) after taking oral medicines. Summary  Erectile dysfunction (ED) is the inability to get or keep an erection during sexual intercourse. This problem can usually be treated successfully.  This condition is diagnosed based on a physical exam, your symptoms, and tests to determine the cause. Treatment varies depending on the cause and may include medicines, hormone therapy, surgery, or a vacuum pump.  You may need follow-up visits to make sure that you are using your medicines or devices correctly.  Get help right away if you are taking or injecting medicines and you have an erection that lasts longer than 4 hours. This information is not intended to replace advice given to you by your health  care provider. Make sure you discuss any questions you have with your health care provider. Document Revised: 09/10/2019 Document Reviewed: 09/10/2019 Elsevier Patient Education  2021 Elsevier Inc.  

## 2020-01-19 NOTE — Progress Notes (Signed)
01/19/2020 9:57 AM   James Norris Sep 27, 1950 [id number]  Referring provider: [employer], James Norris,  James Norris [id number]  Urinary urgency and ED  HPI: [name] is a 70yo here for followup for urinary urgency and erectile dysfunction. His urgency is mild and he is not bothered by his urinary urgency. He has ED and takes tadalafil with mixed results.    PMH: Past Medical History:  Diagnosis Date  . Anxiety   . GERD (gastroesophageal reflux disease)   . Hyperlipemia   . Kidney stone   . Nephrolithiasis 01/06/2019  . Sleep apnea    cpap    Surgical History: Past Surgical History:  Procedure Laterality Date  . BALLOON DILATION N/A 03/11/2012   Procedure: BALLOON DILATION;  Surgeon: Rogene Houston, MD;  [facility];  Service: Endoscopy;  Laterality: N/A;  . COLONOSCOPY WITH ESOPHAGOGASTRODUODENOSCOPY (EGD) N/A 03/11/2012   Procedure: COLONOSCOPY WITH ESOPHAGOGASTRODUODENOSCOPY (EGD);  Surgeon: Rogene Houston, MD;  [facility]: [facility];  Service: Endoscopy;  Laterality: N/A;  1200  . CYSTOSCOPY W/ RETROGRADES Left 01/25/2019   Procedure: CYSTOSCOPY WITH LEFT RETROGRADE PYELOGRAM;  Surgeon: Cleon Gustin, MD;  Location: [facility];  Service: Urology;  Laterality: Left;  . CYSTOSCOPY W/ URETERAL STENT REMOVAL Left 01/25/2019   Procedure: CYSTOSCOPY WITH LEFT URETERAL STENT REMOVAL;  Surgeon: Cleon Gustin, MD;  Location: [facility];  Service: Urology;  Laterality: Left;  . CYSTOSCOPY/URETEROSCOPY/HOLMIUM LASER/STENT PLACEMENT Left 01/06/2019   Procedure: CYSTOSCOPY/URETEROSCOPY//STENT PLACEMENT WITH LEFT RETROGRADE PYELOGRAM;  Surgeon: Cleon Gustin, MD;  Location: [facility];  Service: Urology;  Laterality: Left;  . ESOPHAGEAL DILATION N/A 02/15/2015   Procedure: ESOPHAGEAL DILATION;  Surgeon: Rogene Houston, MD;  [facility];  Service: Endoscopy;  Laterality: N/A;  . ESOPHAGOGASTRODUODENOSCOPY N/A 02/15/2015   Procedure:  ESOPHAGOGASTRODUODENOSCOPY (EGD);  Surgeon: Rogene Houston, MD;  [facility];  Service: Endoscopy;  Laterality: N/A;  2:00  . EXTRACORPOREAL SHOCK WAVE LITHOTRIPSY Left 01/04/2019   Procedure: EXTRACORPOREAL SHOCK WAVE LITHOTRIPSY (ESWL);  Surgeon: Alexis Frock, MD;  Location: [facility];  Service: Urology;  Laterality: Left;  . EYE SURGERY     lasik- bilaterally  . TONSILLECTOMY    . URETEROSCOPY Left 01/25/2019   Procedure: DIAGNOSTIC LEFT URETEROSCOPY;  Surgeon: Cleon Gustin, MD;  Location: [facility];  Service: Urology;  Laterality: Left;  . XI ROBOTIC ASSISTED SIMPLE PROSTATECTOMY N/A 04/05/2019   Procedure: XI ROBOTIC ASSISTED SIMPLE PROSTATECTOMY;  Surgeon: Cleon Gustin, MD;  Location: [facility];  Service: Urology;  Laterality: N/A;  3 HRS    Home Medications:  Allergies as of 01/19/2020   No Known Allergies     Medication List       Accurate as of January 19, 2020  9:57 AM. If you have any questions, ask your nurse or doctor.        ALPRAZolam 0.5 MG tablet Commonly known as: XANAX Take 0.5 mg by mouth 3 (three) times daily as needed for anxiety.   ferrous sulfate 325 (65 FE) MG EC tablet Take 325 mg by mouth daily with breakfast.   metFORMIN 500 MG 24 hr tablet Commonly known as: GLUCOPHAGE-XR SMARTSIG:1 Tablet(s) By Mouth Every Evening   pantoprazole 40 MG tablet Commonly known as: PROTONIX TAKE 1 TABLET BY MOUTH ONCE DAILY BEFORE BREAKFAST   rosuvastatin 10 MG tablet Commonly known as: CRESTOR Take 1 tablet (10 mg total) by mouth daily.   sildenafil 20 MG  tablet Commonly known as: REVATIO Take 20 mg by mouth 3 (three) times daily.   tadalafil 20 MG tablet Commonly known as: CIALIS Take 1 tablet (20 mg total) by mouth daily as needed for erectile dysfunction.   Wegovy 0.5 MG/0.5ML Soaj Generic drug: Semaglutide-Weight Management INJECT 0.5 MG SUBCUTANEOUSLY ONCE WEEKLY ON THE same DAY of each WEEK   Wegovy 2.4 MG/0.75ML Soaj Generic  drug: Semaglutide-Weight Management INJECT 0.63ml SUBCUTANEOUSLY EVERY WEEK ON THE same DAY OF EACH WEEK       Allergies: No Known Allergies  Family History: Family History  Problem Relation Age of Onset  . Dementia Mother   . Breast cancer Mother   . Coronary artery disease Mother   . Coronary artery disease Father   . Hypertension Father   . Diabetes type II Father   . Hyperlipidemia Father   . GER disease Father     Social History:  reports that he quit smoking about 9 years ago. His smoking use included cigarettes. He smoked 1.50 packs per day. He has never used smokeless tobacco. He reports current alcohol use. He reports that he does not use drugs.  ROS: All other review of systems were reviewed and are negative except what is noted above in HPI  Physical Exam: BP (!) 144/77   Pulse 75   Temp 98.4 F (36.9 C)   Ht 5\' 11"  (1.803 m)   Wt 230 lb (104.3 kg)   BMI 32.08 kg/m   Constitutional:  Alert and oriented, No acute distress. HEENT: [place] AT, moist mucus membranes.  Trachea midline, no masses. Cardiovascular: No clubbing, cyanosis, or edema. Respiratory: Normal respiratory effort, no increased work of breathing. GI: Abdomen is soft, nontender, nondistended, no abdominal masses GU: No CVA tenderness.  Lymph: No cervical or inguinal lymphadenopathy. Skin: No rashes, bruises or suspicious lesions. Neurologic: Grossly intact, no focal deficits, moving all 4 extremities. Psychiatric: Normal mood and affect.  Laboratory Data: Lab Results  Component Value Date   WBC 12.4 (H) 04/08/2019   HGB 14.9 04/08/2019   HCT 45.3 04/08/2019   MCV 88.8 04/08/2019   PLT 236 04/08/2019    Lab Results  Component Value Date   CREATININE 0.94 04/08/2019    No results found for: PSA  No results found for: TESTOSTERONE  No results found for: HGBA1C  Urinalysis    Component Value Date/Time   COLORURINE YELLOW 01/05/2019 2056   APPEARANCEUR CLOUDY (A) 01/05/2019 2056    LABSPEC 1.017 01/05/2019 2056   PHURINE 5.0 01/05/2019 2056   GLUCOSEU NEGATIVE 01/05/2019 2056   HGBUR LARGE (A) 01/05/2019 2056   BILIRUBINUR neg 05/24/2019 1504   KETONESUR 20 (A) 01/05/2019 2056   PROTEINUR Positive (A) 05/24/2019 1504   PROTEINUR 30 (A) 01/05/2019 2056   UROBILINOGEN 0.2 05/24/2019 1504   UROBILINOGEN 0.2 03/27/2010 1655   NITRITE neg 05/24/2019 1504   NITRITE NEGATIVE 01/05/2019 2056   LEUKOCYTESUR Negative 05/24/2019 1504   LEUKOCYTESUR SMALL (A) 01/05/2019 2056    Lab Results  Component Value Date   BACTERIA RARE (A) 01/05/2019    Pertinent Imaging:  Results for orders placed during the hospital encounter of 01/04/19  DG Abd 1 View  Narrative CLINICAL DATA:  Left flank pain.  History of left ureteral calculus.  EXAM: ABDOMEN - 1 VIEW  COMPARISON:  CT scan 01/02/2019  FINDINGS: Stable appearing position of the left ureteral calculus located adjacent to the L4 vertebral body. Stable lower pole right renal calculus.  The bowel gas  pattern is unremarkable. Scattered air in stool noted in the colon. No free air.  IMPRESSION: 1. Stable mid left ureteral calculus. 2. Right lower pole renal calculus. 3. Unremarkable bowel gas pattern.   Electronically Signed By: Marijo Sanes M.D. On: 01/04/2019 06:39  No results found for this or any previous visit.  No results found for this or any previous visit.  No results found for this or any previous visit.  Results for orders placed during the hospital encounter of 02/26/19  US RENAL  Narrative CLINICAL DATA:  Nephrolithiasis.  Gross hematuria.  EXAM: RENAL / URINARY TRACT ULTRASOUND COMPLETE  COMPARISON:  CT, 01/02/2019  FINDINGS: Right Kidney:  Renal measurements: 13.0 x 7.2 x 7.5 cm = volume: 365.3 mL. Normal parenchymal echogenicity. No masses. Shadowing echogenic stone in the lower pole, 8 mm. No hydronephrosis.  Left Kidney:  Renal measurements: 13.3 x 7.7 x 6.8 cm =  volume: 364.8 mL. Normal parenchymal echogenicity. No masses. Shadowing echogenic stone in the lower pole, 10 mm. No hydronephrosis.  Bladder:  Mildly distended. There are calcifications projecting at the bladder base. No mass.  Other:  Enlarged prostate, volume calculated at 288 mL.  IMPRESSION: 1. No acute findings.  No hydronephrosis. 2. Nonobstructing stones in the lower pole of each kidney, on the right present on the prior CT, not seen on the prior CT in the left kidney. 3. Stones project at the bladder base.  No bladder mass. 4. Significant prosthetic enlargement.   Electronically Signed By: Lajean Manes M.D. On: 02/26/2019 15:53  No results found for this or any previous visit.  No results found for this or any previous visit.  Results for orders placed during the hospital encounter of 01/02/19  CT Renal Stone Study  Narrative CLINICAL DATA:  Left flank pain.  EXAM: CT ABDOMEN AND PELVIS WITHOUT CONTRAST  TECHNIQUE: Multidetector CT imaging of the abdomen and pelvis was performed following the standard protocol without IV contrast.  COMPARISON:  03/27/2010  FINDINGS: Lower chest: 10 mm lobular nodule in the left lower lobe is stable compared to chest CT of 08/11/2018. This nodule measured 9 mm on the cardiac CT of 10/17/2017 and was 5 mm on a chest CT of 02/11/2012.  Hepatobiliary: No suspicious focal abnormality within the liver parenchyma. There is no evidence for gallstones, gallbladder wall thickening, or pericholecystic fluid. No intrahepatic or extrahepatic biliary dilation.  Pancreas: No focal mass lesion. No dilatation of the main duct. No intraparenchymal cyst. No peripancreatic edema.  Spleen: No splenomegaly. No focal mass lesion.  Adrenals/Urinary Tract: No adrenal nodule or mass. 5 mm nonobstructing stone noted lower pole right kidney. No right ureteral stone. No secondary changes in the right kidney or ureter. No left renal stones  although there is mild left hydronephrosis. 7 x 7 x 4 mm stone identified in the proximal left ureter (axial 46/series 2). No other left urinary stone. 2.1 cm water density lesion lower pole left kidney is compatible with a cyst. No bladder stone.  Stomach/Bowel: Small to moderate hiatal hernia. Duodenum is normally positioned as is the ligament of Treitz. No small bowel wall thickening. No small bowel dilatation. The terminal ileum is normal. The appendix is best seen on coronal images and is unremarkable. No gross colonic mass. No colonic wall thickening. Diverticular changes are noted in the left colon without evidence of diverticulitis.  Vascular/Lymphatic: There is abdominal aortic atherosclerosis without aneurysm. There is no gastrohepatic or hepatoduodenal ligament lymphadenopathy. No retroperitoneal or mesenteric lymphadenopathy.  No pelvic sidewall lymphadenopathy.  Reproductive: Prostate gland is markedly enlarged.  Other: No intraperitoneal free fluid.  Musculoskeletal: No worrisome lytic or sclerotic osseous abnormality.  IMPRESSION: 1. 7 x 7 x 4 mm stone identified in the proximal left ureter with mild left hydronephrosis. 2. 5 mm nonobstructing right renal stone. No other urinary stone disease evident on today's study. 3. 10 mm lobular nodule left lower lobe has shown slow growth comparing back over almost 7 years to a study from 2014. Given the growth characteristics, this is probably benign. Conservatively, follow-up CT chest in 12 months could be used to reassess. 4. Small to moderate hiatal hernia. 5. Marked prostatomegaly. 6.  Aortic Atherosclerois (ICD10-170.0)   Electronically Signed By: Misty Stanley M.D. On: 01/02/2019 10:53   Assessment & Plan:    1. Urinary urgency -resolved  2. Erectile dysfunction due to arterial insufficiency -Tadalafil prn   No follow-ups on file.  [name], MD  [facility] [place]

## 2020-01-19 NOTE — Progress Notes (Signed)
Urological Symptom Review  Patient is experiencing the following symptoms: Erection problems (male only)   Review of Systems  Gastrointestinal (upper)  : Negative for upper GI symptoms  Gastrointestinal (lower) : Diarrhea Constipation  (Iron?)  Constitutional : Negative for symptoms  Skin: Negative for skin symptoms  Eyes: Negative for eye symptoms  Ear/Nose/Throat : Negative for Ear/Nose/Throat symptoms  Hematologic/Lymphatic: Negative for Hematologic/Lymphatic symptoms  Cardiovascular : Negative for cardiovascular symptoms  Respiratory : Negative for respiratory symptoms  Endocrine: Negative for endocrine symptoms  Musculoskeletal: Negative for musculoskeletal symptoms  Neurological: Negative for neurological symptoms  Psychologic: Negative for psychiatric symptoms

## 2020-01-22 DIAGNOSIS — Z03818 Encounter for observation for suspected exposure to other biological agents ruled out: Secondary | ICD-10-CM | POA: Diagnosis not present

## 2020-01-22 DIAGNOSIS — Z20822 Contact with and (suspected) exposure to covid-19: Secondary | ICD-10-CM | POA: Diagnosis not present

## 2020-02-01 ENCOUNTER — Other Ambulatory Visit: Payer: Self-pay

## 2020-02-01 ENCOUNTER — Encounter (INDEPENDENT_AMBULATORY_CARE_PROVIDER_SITE_OTHER): Payer: Self-pay | Admitting: Internal Medicine

## 2020-02-01 ENCOUNTER — Telehealth (INDEPENDENT_AMBULATORY_CARE_PROVIDER_SITE_OTHER): Payer: PPO | Admitting: Internal Medicine

## 2020-02-01 ENCOUNTER — Ambulatory Visit: Payer: PPO | Admitting: Dermatology

## 2020-02-01 VITALS — Ht 71.0 in | Wt 230.0 lb

## 2020-02-01 DIAGNOSIS — U071 COVID-19: Secondary | ICD-10-CM

## 2020-02-01 DIAGNOSIS — K2100 Gastro-esophageal reflux disease with esophagitis, without bleeding: Secondary | ICD-10-CM | POA: Diagnosis not present

## 2020-02-01 DIAGNOSIS — K760 Fatty (change of) liver, not elsewhere classified: Secondary | ICD-10-CM

## 2020-02-01 NOTE — Progress Notes (Signed)
Virtual Visit via Video Note  I connected with James Norris on 02/01/20 at 12:30 PM EST by a video enabled telemedicine application and verified that I am speaking with the correct person using two identifiers.  Location: Patient: home Provider: office   I discussed the limitations of evaluation and management by telemedicine and the availability of in person appointments. The patient expressed understanding and agreed to proceed.  Database and subjective  Patient is 70 year old Caucasian male who has history of erosive GERD complicated by esophageal stricture requiring esophageal dilation, hiatal hernia as well history of mildly elevated ALT due to fatty liver who is undergoing yearly visit.  He was last seen on 01/26/2019. He states he is doing well from GI standpoint.  He feels pantoprazole is working very well.  He rarely has heartburn.  However if he skips medication for 2 days he will start to have heartburn.  As a result he rarely misses his medication.  He is not having any side effects.  He denies dysphagia nausea vomiting or abdominal pain.  His bowels move regularly.  He got concerned when he noted black stools but then he realized he was taking ferrous sulfate.  He is not taking it 3 times a week.  He also has a history of iron deficiency anemia. He states he tested Covid positive yesterday.  His wife also tested positive.  They recently came back from a cruise.  They had been negative when the board the ship.  He has sore throat and hoarseness.  He is not running fever.  He does not have chest pain or shortness of breath. He says Dr. Gerarda Fraction has called some medications for him but he has not yet filled them.  He says he does not do any scheduled physical activity but  he stays busy.  He walks an average of 7000 steps a day.  He has lost 28 pounds since I last saw him.  His weight in September 2021 was 231 pounds when he saw Dr. Etta Grandchild of pulmonary service  Prior GI studies  He had normal screening colonoscopy in June 2006 and then again in March 2014. He had esophagogastroduodenoscopy with esophageal dilation and December 2012, March 2014 and more recently in February 2017    Current Outpatient Medications:  .  ALPRAZolam (XANAX) 0.5 MG tablet, Take 0.5 mg by mouth 3 (three) times daily as needed for anxiety. , Disp: , Rfl:  .  ferrous sulfate 325 (65 FE) MG EC tablet, Take 325 mg by mouth daily with breakfast. , Disp: , Rfl:  .  pantoprazole (PROTONIX) 40 MG tablet, TAKE 1 TABLET BY MOUTH ONCE DAILY BEFORE BREAKFAST, Disp: 90 tablet, Rfl: 0 .  rosuvastatin (CRESTOR) 10 MG tablet, Take 1 tablet (10 mg total) by mouth daily., Disp: 90 tablet, Rfl: 2 .  sildenafil (REVATIO) 20 MG tablet, Take 20 mg by mouth 3 (three) times daily., Disp: , Rfl:  .  tadalafil (CIALIS) 20 MG tablet, Take 1 tablet (20 mg total) by mouth as needed for erectile dysfunction., Disp: 10 tablet, Rfl: 5 .  WEGOVY 2.4 MG/0.75ML SOAJ, INJECT 0.70ml SUBCUTANEOUSLY EVERY WEEK ON THE same DAY OF EACH WEEK, Disp: , Rfl:    Observations/Objective:  Weight reported to be 231 pounds  Assessment and Plan:  #1.  Chronic GERD complicated by esophageal stricture which has been dilated 3 times in the past most recently in 2017.  He also has sliding hiatal hernia.  He appears to be doing  well with dietary measures and single dose PPI.  #2.  Fatty liver.  Transaminases have remained normal.  Will request recent blood work from Dr. Vita Barley office.  #3.  History of iron deficiency anemia felt to be due to impaired iron absorption in the setting of chronic acid suppression.  His hemoglobin has been normal.  #4. COVID-19 infection.  He has URI symptoms.  Will refer him to remdesivir clinic  for consideration of monoclonal antibody.  Follow Up Instructions:  We will obtain copy of recent blood work from Dr. Nolon Rod office. Patient's condition discussed with Dr. Nolon Rod and he is in agreement with referral to ambulatory clinic for monoclonal antibody infusion.  Patient understands he may or may not meet the criterion for this therapy.   Office visit in 6 months.  I discussed the assessment and treatment plan with the patient. The patient was provided an opportunity to ask questions and all were answered. The patient agreed with the plan and demonstrated an understanding of the instructions.   The patient was advised to call back or seek an in-person evaluation if the symptoms worsen or if the condition fails to improve as anticipated.  I provided 22 minutes of non-face-to-face time during this encounter.   Hildred Laser, MD

## 2020-02-02 ENCOUNTER — Telehealth (HOSPITAL_COMMUNITY): Payer: Self-pay | Admitting: Family

## 2020-02-02 ENCOUNTER — Telehealth: Payer: Self-pay | Admitting: Family Medicine

## 2020-02-02 NOTE — Progress Notes (Addendum)
This is a telemedicine visit Patient location: Home Provider location: Office  Cardiology Office Note  Date: 02/03/2020   ID: James Norris, DOB [birth year], MRN [id number]  PCP:  Redmond School, MD  Cardiologist:  Rozann Lesches, MD Electrophysiologist:  None   Chief Complaint: Follow up CAD  History of Present Illness: James Norris is a 70 y.o. male with a history of Coronary artery calcification on CT, Hyperlipidemia, lung nodules, GERD, OSA, HLD, moderate hiatal hernia, pulmonary hypertension, history of prostate surgery.  Last encounter with Bernerd Pho, [place] 11/19/2017.  Reported he had been well overall.  He denied any chest pain, dyspnea on exertion, orthopnea, PND, lower extremity edema, palpitations.  He did stop smoking 4 years prior.  He was planning on going back to the [facility] to increase exercise due to steady weight gain.  He was continuing atorvastatin 40 mg daily.  Lipid profile and LFTs were ordered to be done in 6 to 8 weeks.  He was following pulmonology for pulmonary nodule in [place].  He recently saw his primary care provider for annual  wellness visit.  He was sent by  primary care provider for evaluation of cardiac risk factors and history of coronary artery calcifications on CT scan with strong family history of heart disease in his family and his father who was a physician in the area.  He has been taking Crestor 10 mg 3 times per week only.  Most recent LDL was 88.  Significant history of smoking up to 3 packs/day in the past.  Complains of some dyspnea on exertion and activity intolerance.  Denies any CVA or TIA-like symptoms, palpitations or arrhythmias, orthostatic symptoms, PND, orthopnea, lower extremity edema, claudication-like symptoms, DVT or PE-like symptoms.  Blood pressure elevated on arrival today at 140/80.  Recent visit with Dr. Gerarda Fraction his primary care physician blood pressure was 120/70.  Spoke with patient by phone and went over his stress  test and echocardiogram.  He verbalizes understanding of both test results.  He is currently suffering with Covid infection.  He was unable to make his schedule abdominal aortic ultrasound due to Covid infection.  He denies any anginal or exertional symptoms, palpitations or arrhythmias, orthostatic symptoms, CVA or TIA-like symptoms, PND or orthopnea.  No bleeding issues, claudication-like symptoms, DVT or PE-like symptoms, lower extremity edema.  He is asking that we reschedule the abdominal study and he will have that done.  Past Medical History:  Diagnosis Date  . Anxiety   . GERD (gastroesophageal reflux disease)   . Hyperlipemia   . Kidney stone   . Nephrolithiasis 01/06/2019  . Sleep apnea    cpap    Past Surgical History:  Procedure Laterality Date  . BALLOON DILATION N/A 03/11/2012   Procedure: BALLOON DILATION;  Surgeon: Rogene Houston, MD;  [facility];  Service: Endoscopy;  Laterality: N/A;  . COLONOSCOPY WITH ESOPHAGOGASTRODUODENOSCOPY (EGD) N/A 03/11/2012   Procedure: COLONOSCOPY WITH ESOPHAGOGASTRODUODENOSCOPY (EGD);  Surgeon: Rogene Houston, MD;  [facility];  Service: Endoscopy;  Laterality: N/A;  1200  . CYSTOSCOPY W/ RETROGRADES Left 01/25/2019   Procedure: CYSTOSCOPY WITH LEFT RETROGRADE PYELOGRAM;  Surgeon: Cleon Gustin, MD;  Location: [facility];  Service: Urology;  Laterality: Left;  . CYSTOSCOPY W/ URETERAL STENT REMOVAL Left 01/25/2019   Procedure: CYSTOSCOPY WITH LEFT URETERAL STENT REMOVAL;  Surgeon: Cleon Gustin, MD;  Location: [facility];  Service: Urology;  Laterality: Left;  . CYSTOSCOPY/URETEROSCOPY/HOLMIUM LASER/STENT PLACEMENT  Left 01/06/2019   Procedure: CYSTOSCOPY/URETEROSCOPY//STENT PLACEMENT WITH LEFT RETROGRADE PYELOGRAM;  Surgeon: Cleon Gustin, MD;  Location: [facility];  Service: Urology;  Laterality: Left;  . ESOPHAGEAL DILATION N/A 02/15/2015   Procedure: ESOPHAGEAL DILATION;  Surgeon: Rogene Houston, MD;  Location[facility];  Service: Endoscopy;  Laterality: N/A;  . ESOPHAGOGASTRODUODENOSCOPY N/A 02/15/2015   Procedure: ESOPHAGOGASTRODUODENOSCOPY (EGD);  Surgeon: Rogene Houston, MD;  [facility];  Service: Endoscopy;  Laterality: N/A;  2:00  . EXTRACORPOREAL SHOCK WAVE LITHOTRIPSY Left 01/04/2019   Procedure: EXTRACORPOREAL SHOCK WAVE LITHOTRIPSY (ESWL);  Surgeon: Alexis Frock, MD;  Location: [facility];  Service: Urology;  Laterality: Left;  . EYE SURGERY     lasik- bilaterally  . TONSILLECTOMY    . URETEROSCOPY Left 01/25/2019   Procedure: DIAGNOSTIC LEFT URETEROSCOPY;  Surgeon: Cleon Gustin, MD;  Location: [facility];  Service: Urology;  Laterality: Left;  . XI ROBOTIC ASSISTED SIMPLE PROSTATECTOMY N/A 04/05/2019   Procedure: XI ROBOTIC ASSISTED SIMPLE PROSTATECTOMY;  Surgeon: Cleon Gustin, MD;  Location: [facility];  Service: Urology;  Laterality: N/A;  3 HRS    Current Outpatient Medications  Medication Sig Dispense Refill  . ALPRAZolam (XANAX) 0.5 MG tablet Take 0.5 mg by mouth 3 (three) times daily as needed for anxiety.     . ferrous sulfate 325 (65 FE) MG EC tablet Take 325 mg by mouth daily with breakfast.     . pantoprazole (PROTONIX) 40 MG tablet TAKE 1 TABLET BY MOUTH ONCE DAILY BEFORE BREAKFAST 90 tablet 0  . rosuvastatin (CRESTOR) 10 MG tablet Take 1 tablet (10 mg total) by mouth daily. 90 tablet 2  . sildenafil (REVATIO) 20 MG tablet Take 20 mg by mouth 3 (three) times daily.    . tadalafil (CIALIS) 20 MG tablet Take 1 tablet (20 mg total) by mouth as needed for erectile dysfunction. 10 tablet 5  . WEGOVY 2.4 MG/0.75ML SOAJ INJECT 0.77ml SUBCUTANEOUSLY EVERY WEEK ON THE same DAY OF EACH WEEK     No current facility-administered medications for this visit.   Allergies:  Patient has no known allergies.   Social History: The patient  reports that he quit smoking about 9 years ago. His smoking use included cigarettes. He smoked 1.50 packs per day. He has never used  smokeless tobacco. He reports current alcohol use. He reports that he does not use drugs.   Family History: The patient's family history includes Breast cancer in his mother; Coronary artery disease in his father and mother; Dementia in his mother; Diabetes type II in his father; GER disease in his father; Hyperlipidemia in his father; Hypertension in his father.   ROS:  Please see the history of present illness. Otherwise, complete review of systems is positive for none.  All other systems are reviewed and negative.   Physical Exam: VS:  Pulse 70   Ht 5\' 11"  (1.803 m)   Wt 235 lb (106.6 kg)   SpO2 97%   BMI 32.78 kg/m , BMI Body mass index is 32.78 kg/m.  Wt Readings from Last 3 Encounters:  02/03/20 235 lb (106.6 kg)  02/01/20 230 lb (104.3 kg)  01/19/20 230 lb (104.3 kg)    Patient spoke in a normal speech pattern.  No evidence of dyspnea, cough, or wheezing noted.  ECG:  An ECG dated 12/16/2019 was personally reviewed today and demonstrated:  Normal sinus rhythm right bundle branch block rate of 72.  Recent Labwork: 04/08/2019: ALT 26; AST  18; BUN 18; Creatinine, Ser 0.94; Hemoglobin 14.9; Platelets 236; Potassium 4.2; Sodium 138  No results found for: CHOL, TRIG, HDL, CHOLHDL, VLDL, LDLCALC, LDLDIRECT  Other Studies Reviewed Today:  NST 12/24/2019 Study Result  Narrative & Impression   Findings consistent with small prior apical myocardial infarction with very mild peri-infarct ischemia.  This is a low risk study.  The left ventricular ejection fraction is normal (55-65%).  There was no ST segment deviation noted during stress.       Echocardiogram 12/24/2019 1. Left ventricular ejection fraction, by estimation, is 70 to 75%. The left ventricle has hyperdynamic function. The left ventricle has no regional wall motion abnormalities. There is mild left ventricular hypertrophy. Left ventricular diastolic parameters are indeterminate. 2. Right ventricular systolic  function is normal. The right ventricular size is normal. 3. The mitral valve is normal in structure. No evidence of mitral valve regurgitation. No evidence of mitral stenosis. 4. The aortic valve is tricuspid. There is mild calcification of the aortic valve. There is mild thickening of the aortic valve. Aortic valve regurgitation is not visualized. No aortic stenosis is present. 5. The inferior vena cava is normal in size with greater than 50% respiratory variability, suggesting right atrial pressure of 3 mmHg.    Echocardiogram: 07/2017 Study Conclusions  - Left ventricle: The cavity size was normal. Wall thickness was increased in a pattern of mild LVH. Systolic function was normal. The estimated ejection fraction was in the range of 60% to 65%. Wall motion was normal; there were no regional wall motion abnormalities. Left ventricular diastolic function parameters were normal for the patient&'s age. - Aortic valve: Mildly calcified annulus. Probably trileaflet; mildly calcified leaflets. - Mitral valve: Mildly calcified annulus. There was trivial regurgitation. - Left atrium: The atrium was at the upper limits of normal in size. - Right atrium: Central venous pressure (est): 3 mm Hg. - Atrial septum: No defect or patent foramen ovale was identified. - Tricuspid valve: There was trivial regurgitation. - Pulmonary arteries: PA peak pressure: 17 mm Hg (S). - Pericardium, extracardiac: There was no pericardial effusion.  Coronary CT: 10/17/2017 Aorta: Normal size. No calcifications. No dissection.  Aortic Valve: Trileaflet. Mild calcification of the aortic valve.  Coronary Arteries: Normal coronary origin. Right dominance.  RCA is a small, co-dominant artery. There is mild (1-24%) mixed calcified and noncalcified plaque proximally.  Left main is a large artery that gives rise to LAD and LCX arteries. There is mild (1-24%) calcified  plaque.  LAD is a large vessel that gives rise to a small D1 and normal D2. It has mild (1-24%) calcified plaque in the mid LAD just distal to D2. D2 has mild (1-24%) calcified plaque proximally.  LCX is a co-dominant artery that gives rise a large OM1 and small OM2 branches. There is mild (1-24%) diffuse mixed plaque in the LCX and OM1.  Other findings:  Normal pulmonary vein drainage into the left atrium.  Normal let atrial appendage without a thrombus.  Normal size of the pulmonary artery.  IMPRESSION: 1. Coronary calcium score of 221. This was 29 percentile for age and sex matched control.  2. Normal coronary origin with right dominance.  3. Mild, non-obstructive plaque in all coronary distributions.  4. Recommend aggressive risk factor modification and high-potency statin.    Assessment and Plan:    1. Coronary artery calcification seen on CT scan Previous coronary artery calcification on CT scan and 2019 with calcium score of 221 placing him in a  1 percentile for age and sex matched control.  Mild nonobstructive plaque in all coronary distributions.: LAD was a large vessel giving rise to small D1 normal D2.  It had mild 1 to 24% plaque in the mid LAD just distal to D2.  D2 you had mild 1 to 24% calcified plaque proximally.  LCx codominant artery giving rise to a large OM1 and small OM 2 branches.  Mild 1 to 24% diffuse mixed plaques and LCx and OM1.  RCA small, codominant artery with mild 1 to 24% mixed calcified and noncalcified plaque proximally.    Recommendation was for aggressive risk factor modification high potency statins.  Recently complaining of increased dyspnea on exertion. Stress test showed and is consistent with small prior apical myocardial infarction with very mild peri-infarct ischemia.  This was considered a low risk study.  EF was 55 to 65%.  No ST segment deviation noted during stress test.  Currently denies any anginal or exertional  symptoms.  2. Mixed hyperlipidemia Currently taking Crestor 10 mg 3 times weekly on Mondays, Wednesdays, and Fridays.  Recent lipid panel at PCP office on 10/20/2019: TC 154, TG 78, HDL 51, LDL 88.  Continue Crestor to 10 mg p.o. daily.  3. Pulmonary nodule History of pulmonary nodule.  Recent CT of the chest on 04/08/2019 secondary to complaint of chest pain performed at [facility] emergency department.  A 10 mm left lower lobe pulmonary nodule was noted which had not appreciably changed since 2019 were shown to be PET negative.  Report stated it was likely benign.  4.  Pulmonary hypertension Currently taking sildenafil 20 mg p.o. 3 times daily for pulmonary hypertension.  Complaining of increased dyspnea recently.   Recent echocardiogram showed EF of 7075%.  No WMA's.  Mild LVH.  RV systolic function was normal and of normal size.  No mention of pulmonary hypertension.  5.  Screening for abdominal aortic aneurysm. Previous history of smoking age greater than 76.  Please get a screening abdominal aortic ultrasound.  Patient states he missed his appointment due to having Covid.  We will reschedule abdominal study.  Medication Adjustments/Labs and Tests Ordered: Current medicines are reviewed at length with the patient today.  Concerns regarding medicines are outlined above.  Spoke to patient for 15 minutes during telemedicine visit.  Disposition: Follow-up with Dr. Domenic Polite or APP 1 year.  Signed, Levell July, NP 02/03/2020 10:32 AM    [facility] [place], [place], [place] [address] Phone: [phone]; Fax: [phone]

## 2020-02-02 NOTE — Telephone Encounter (Signed)
°  Patient Consent for Virtual Visit         James Norris has provided verbal consent on 02/02/2020 for a virtual visit (video or telephone).   CONSENT FOR VIRTUAL VISIT FOR:  James Norris  By participating in this virtual visit I agree to the following:  I hereby voluntarily request, consent and authorize [facility] and its employed or contracted physicians, physician assistants, nurse practitioners or other licensed health care professionals (the Practitioner), to provide me with telemedicine health care services (the Services") as deemed necessary by the treating Practitioner. I acknowledge and consent to receive the Services by the Practitioner via telemedicine. I understand that the telemedicine visit will involve communicating with the Practitioner through live audiovisual communication technology and the disclosure of certain medical information by electronic transmission. I acknowledge that I have been given the opportunity to request an in-person assessment or other available alternative prior to the telemedicine visit and am voluntarily participating in the telemedicine visit.  I understand that I have the right to withhold or withdraw my consent to the use of telemedicine in the course of my care at any time, without affecting my right to future care or treatment, and that the Practitioner or I may terminate the telemedicine visit at any time. I understand that I have the right to inspect all information obtained and/or recorded in the course of the telemedicine visit and may receive copies of available information for a reasonable fee.  I understand that some of the potential risks of receiving the Services via telemedicine include:   Delay or interruption in medical evaluation due to technological equipment failure or disruption;  Information transmitted may not be sufficient (e.g. poor resolution of images) to allow for appropriate medical decision making by the Practitioner;  and/or   In rare instances, security protocols could fail, causing a breach of personal health information.  Furthermore, I acknowledge that it is my responsibility to provide information about my medical history, conditions and care that is complete and accurate to the best of my ability. I acknowledge that Practitioner's advice, recommendations, and/or decision may be based on factors not within their control, such as incomplete or inaccurate data provided by me or distortions of diagnostic images or specimens that may result from electronic transmissions. I understand that the practice of medicine is not an exact science and that Practitioner makes no warranties or guarantees regarding treatment outcomes. I acknowledge that a copy of this consent can be made available to me via my patient portal ([facility]), or I can request a printed copy by calling the office of [facility].    I understand that my insurance will be billed for this visit.   I have read or had this consent read to me.  I understand the contents of this consent, which adequately explains the benefits and risks of the Services being provided via telemedicine.   I have been provided ample opportunity to ask questions regarding this consent and the Services and have had my questions answered to my satisfaction.  I give my informed consent for the services to be provided through the use of telemedicine in my medical care

## 2020-02-02 NOTE — Telephone Encounter (Signed)
Called to discuss with patient about COVID-19 symptoms and the use of one of the available treatments for those with mild to moderate Covid symptoms and at a high risk of hospitalization.  Pt appears to qualify for outpatient treatment due to co-morbid conditions and/or a member of an at-risk group in accordance with the FDA Emergency Use Authorization.    Unable to reach pt - VM left  James Norris   

## 2020-02-03 ENCOUNTER — Encounter: Payer: Self-pay | Admitting: Family Medicine

## 2020-02-03 ENCOUNTER — Telehealth (INDEPENDENT_AMBULATORY_CARE_PROVIDER_SITE_OTHER): Payer: PPO | Admitting: Family Medicine

## 2020-02-03 ENCOUNTER — Telehealth: Payer: PPO | Admitting: Family Medicine

## 2020-02-03 VITALS — HR 70 | Ht 71.0 in | Wt 235.0 lb

## 2020-02-03 DIAGNOSIS — I2510 Atherosclerotic heart disease of native coronary artery without angina pectoris: Secondary | ICD-10-CM | POA: Diagnosis not present

## 2020-02-03 DIAGNOSIS — I272 Pulmonary hypertension, unspecified: Secondary | ICD-10-CM | POA: Diagnosis not present

## 2020-02-03 DIAGNOSIS — Z136 Encounter for screening for cardiovascular disorders: Secondary | ICD-10-CM | POA: Diagnosis not present

## 2020-02-03 DIAGNOSIS — R918 Other nonspecific abnormal finding of lung field: Secondary | ICD-10-CM

## 2020-02-03 DIAGNOSIS — E782 Mixed hyperlipidemia: Secondary | ICD-10-CM

## 2020-02-03 NOTE — Progress Notes (Signed)
Thank you.  I fixed it

## 2020-02-03 NOTE — Patient Instructions (Addendum)
Your physician wants you to follow-up in: 1 YEAR WITH MD You will receive a reminder letter in the mail two months in advance. If you don't receive a letter, please call our office to schedule the follow-up appointment.  Your physician recommends that you continue on your current medications as directed. Please refer to the Current Medication list given to you today.  Thank you for choosing [facility]!!   

## 2020-02-08 ENCOUNTER — Encounter (INDEPENDENT_AMBULATORY_CARE_PROVIDER_SITE_OTHER): Payer: Self-pay

## 2020-02-11 NOTE — Progress Notes (Signed)
Okay I think I fixed this chart.  Thank you let me know if it is not right

## 2020-02-29 ENCOUNTER — Encounter (INDEPENDENT_AMBULATORY_CARE_PROVIDER_SITE_OTHER): Payer: PPO | Admitting: Ophthalmology

## 2020-02-29 ENCOUNTER — Other Ambulatory Visit: Payer: Self-pay

## 2020-02-29 ENCOUNTER — Other Ambulatory Visit (INDEPENDENT_AMBULATORY_CARE_PROVIDER_SITE_OTHER): Payer: Self-pay | Admitting: *Deleted

## 2020-02-29 DIAGNOSIS — H43813 Vitreous degeneration, bilateral: Secondary | ICD-10-CM | POA: Diagnosis not present

## 2020-02-29 DIAGNOSIS — H33302 Unspecified retinal break, left eye: Secondary | ICD-10-CM | POA: Diagnosis not present

## 2020-02-29 DIAGNOSIS — H35373 Puckering of macula, bilateral: Secondary | ICD-10-CM

## 2020-02-29 MED ORDER — PANTOPRAZOLE SODIUM 40 MG PO TBEC: 40.0000 mg | DELAYED_RELEASE_TABLET | Freq: Every day | ORAL | 0 refills | Status: DC

## 2020-03-01 ENCOUNTER — Other Ambulatory Visit: Payer: Self-pay

## 2020-03-01 ENCOUNTER — Ambulatory Visit: Payer: PPO | Admitting: Dermatology

## 2020-03-01 ENCOUNTER — Encounter: Payer: Self-pay | Admitting: Dermatology

## 2020-03-01 DIAGNOSIS — D1800 Hemangioma unspecified site: Secondary | ICD-10-CM | POA: Diagnosis not present

## 2020-03-01 DIAGNOSIS — Z1283 Encounter for screening for malignant neoplasm of skin: Secondary | ICD-10-CM | POA: Diagnosis not present

## 2020-03-01 DIAGNOSIS — L821 Other seborrheic keratosis: Secondary | ICD-10-CM

## 2020-03-02 ENCOUNTER — Other Ambulatory Visit: Payer: Self-pay | Admitting: Family Medicine

## 2020-03-02 ENCOUNTER — Other Ambulatory Visit (INDEPENDENT_AMBULATORY_CARE_PROVIDER_SITE_OTHER): Payer: PPO

## 2020-03-02 ENCOUNTER — Telehealth: Payer: Self-pay | Admitting: *Deleted

## 2020-03-02 DIAGNOSIS — Z136 Encounter for screening for cardiovascular disorders: Secondary | ICD-10-CM | POA: Diagnosis not present

## 2020-03-02 NOTE — Telephone Encounter (Signed)
Laurine Blazer, LPN  07/04/3660 9:47 PM EST Back to Top     Notified, copy to pcp.

## 2020-03-02 NOTE — Telephone Encounter (Signed)
-----   Message from Verta Ellen., NP sent at 03/02/2020  8:53 AM EST ----- Please call the patient and let him know the aortic scan showed no evidence of abdominal aortic aneurysm.  Thank you

## 2020-03-02 NOTE — Progress Notes (Signed)
Please call the patient and let him know the aortic scan showed no evidence of abdominal aortic aneurysm.  Thank you

## 2020-03-13 ENCOUNTER — Encounter: Payer: Self-pay | Admitting: Dermatology

## 2020-03-13 NOTE — Progress Notes (Signed)
   New Patient   Subjective  James Norris is a 70 y.o. male who presents for the following: Annual Exam (Patient needs full body scan. Dark spot on right shine x years, getting larger. Patient has knot on left in sole of foot, not painful, noticed today. Dark spots on back, getting larger. ).  General skin examination Location:  Duration:  Quality:  Associated Signs/Symptoms: Modifying Factors:  Severity:  Timing: Context:    The following portions of the chart were reviewed this encounter and updated as appropriate:  Tobacco  Allergies  Meds  Problems  Med Hx  Surg Hx  Fam Hx      Objective  Well appearing patient in no apparent distress; mood and affect are within normal limits. Objective  Left Abdomen (side) - Upper: Waist up skin examination plus lower legs. No current atypical pigmented spots or nonmobile skin cancer  Objective  Left Abdomen (side) - Lower: Multiple red raised smooth 1 to 3 mm papules  Objective  Mid Back: Multiple Stuck-on, waxy, tan-brown 3 to 8 mm papules  --Discussed benign etiology and prognosis.    All skin waist up examined. Plus lower legs.   Assessment & Plan  Encounter for screening for malignant neoplasm of skin Left Abdomen (side) - Upper  Yearly skin check, encouraged to self examine twice annually. Continued ultraviolet protection.  Hemangioma, unspecified site Left Abdomen (side) - Lower  Okay to leave if stable  Seborrheic keratosis Mid Back  Okay to leave if stable

## 2020-03-27 IMAGING — PT NM PET TUM IMG INITIAL (PI) SKULL BASE T - THIGH
3 series · 22 of 25 positions shown · non-contrast
Comparison: 06/10/2017 chest CT.

CLINICAL DATA: Initial treatment strategy for enlarging left lower
lobe pulmonary nodule.

EXAM:
NUCLEAR MEDICINE PET SKULL BASE TO THIGH
TECHNIQUE: 12.1 mCi F-18 FDG was injected intravenously. Full-ring PET imaging
was performed from the skull base to thigh after the radiotracer. CT
data was obtained and used for attenuation correction and anatomic
localization.
Fasting blood glucose: 79 mg/dl

[axial ct wb fusion · 15 of 194 slices shown]
[im 1/194]
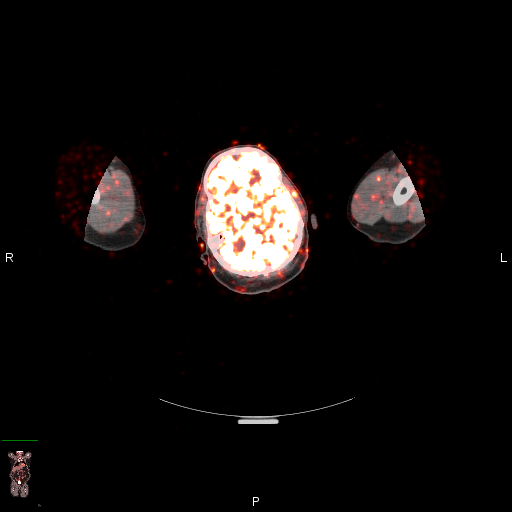
[im 13/194]
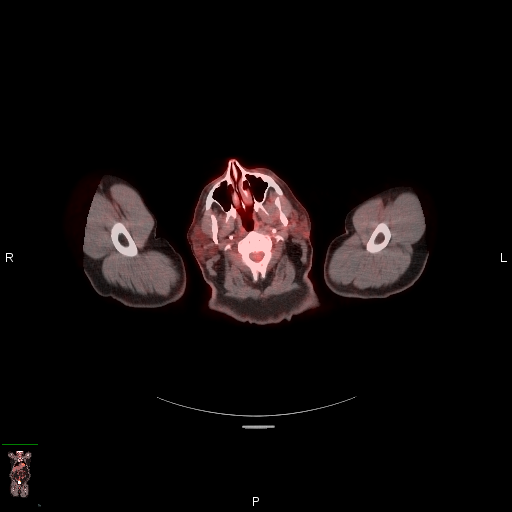
[im 25/194]
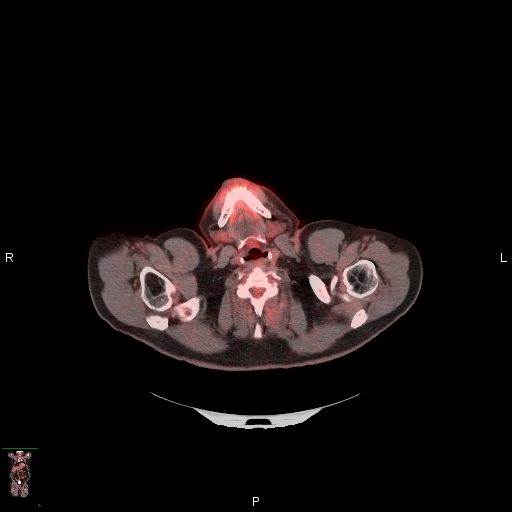
[im 37/194]
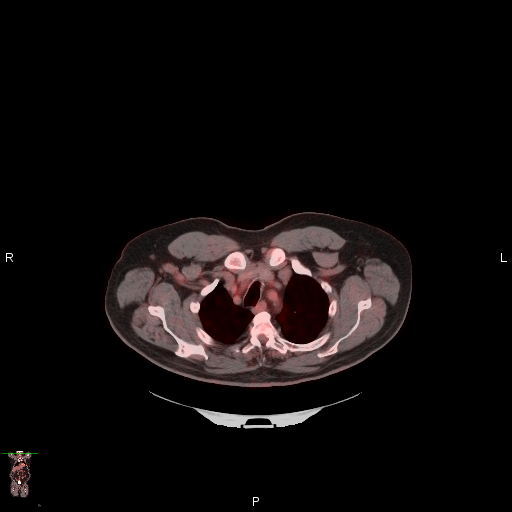
[im 61/194]
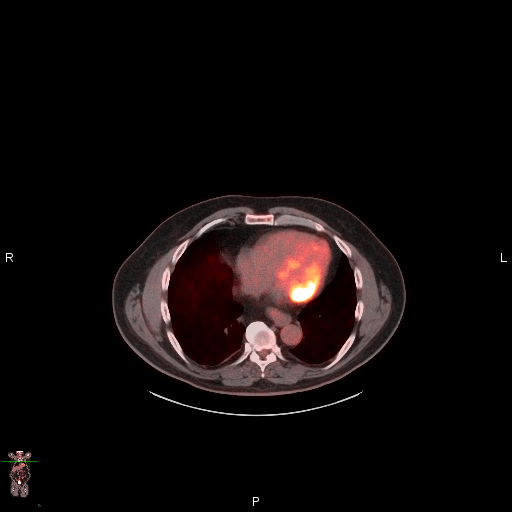
[im 73/194]
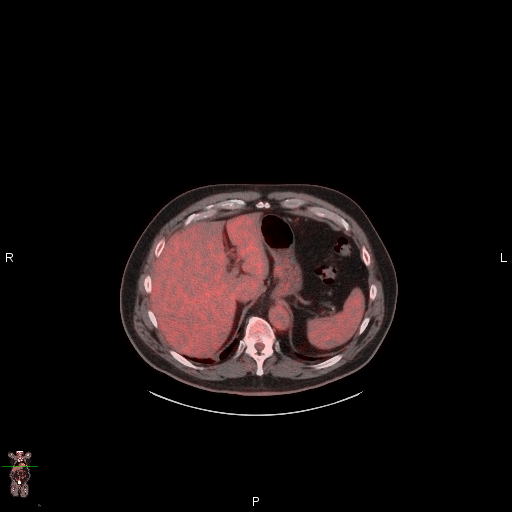
[im 85/194]
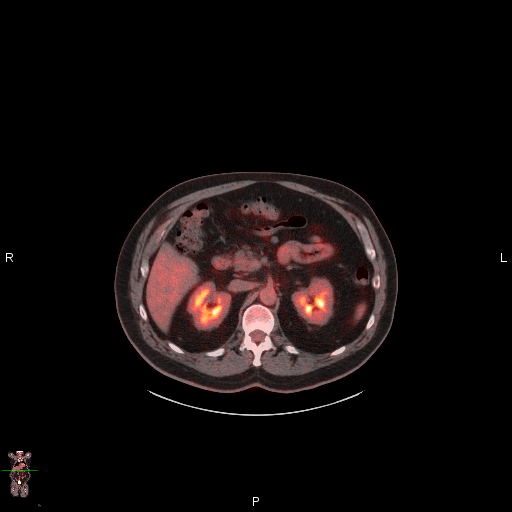
[im 97/194]
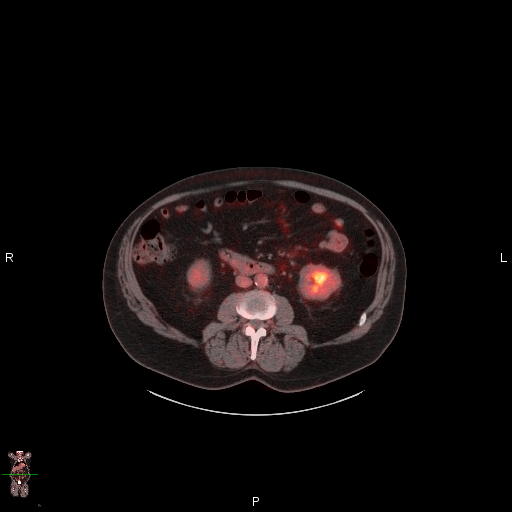
[im 109/194]
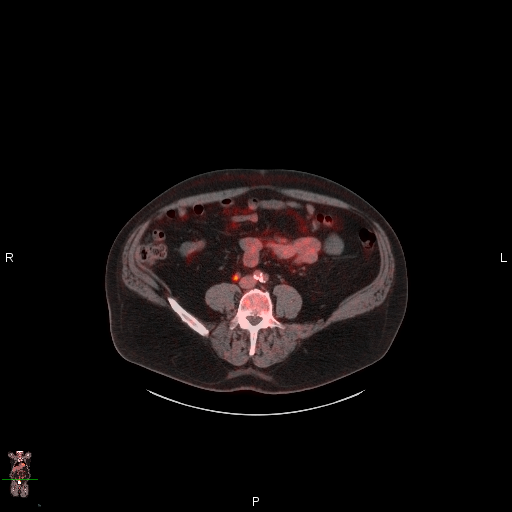
[im 121/194]
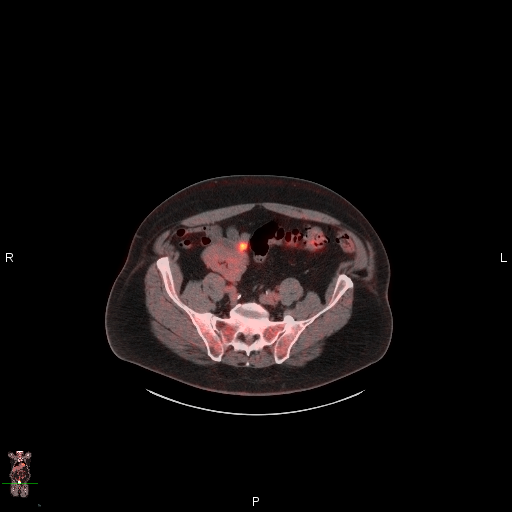
[im 133/194]
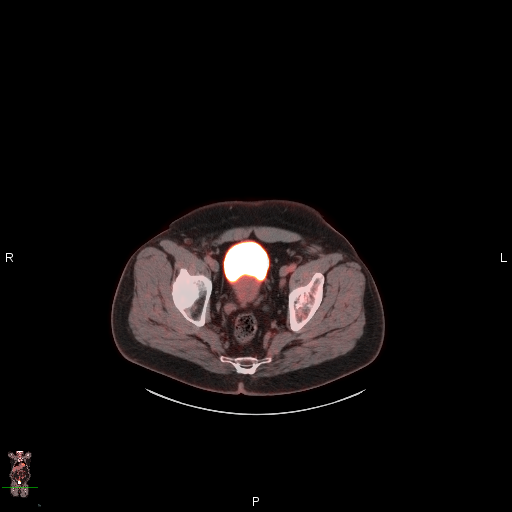
[im 157/194]
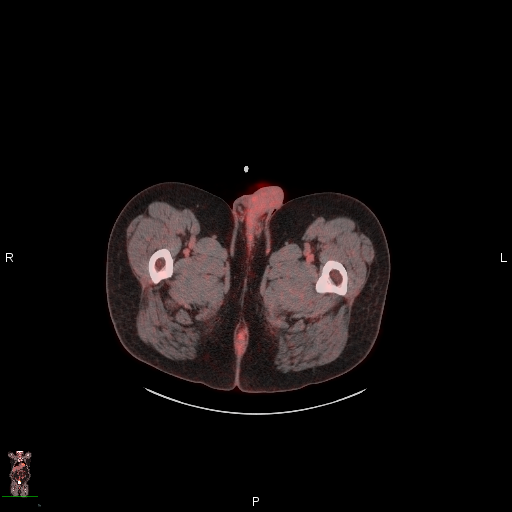
[im 169/194]
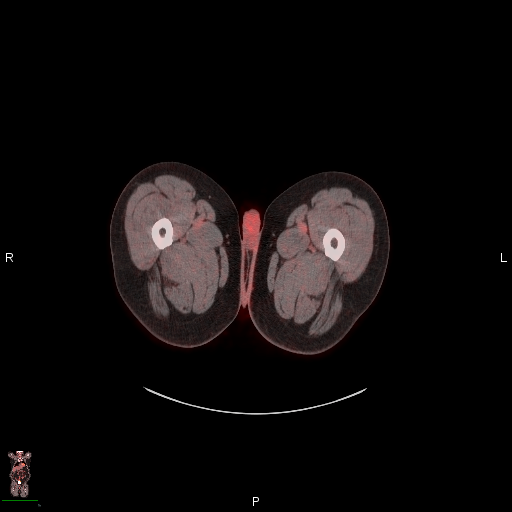
[im 181/194]
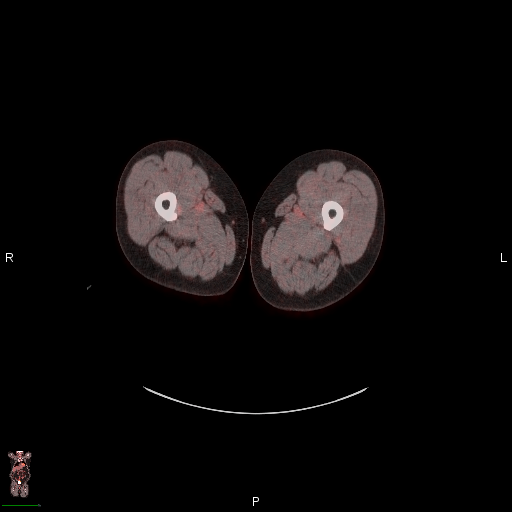
[im 194/194]
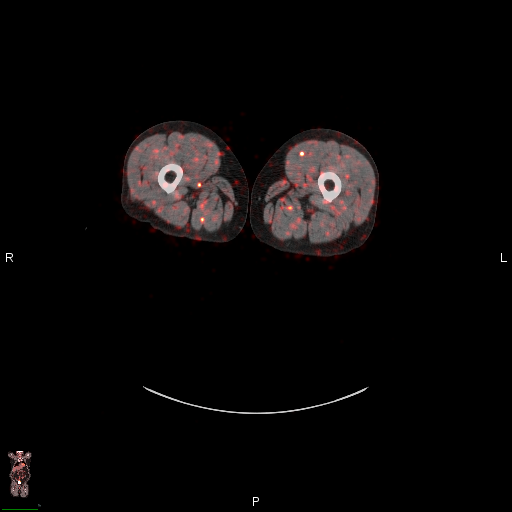

[coronal ct wb fusion · 3 of 40 slices shown]
[im 1/40]
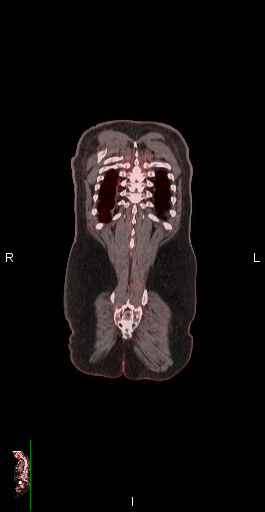
[im 14/40]
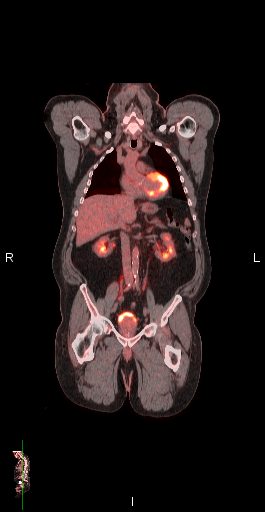
[im 27/40]
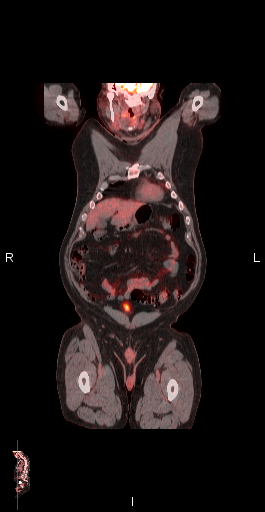

[mip · 4 of 48 slices shown]
[im 1/48]
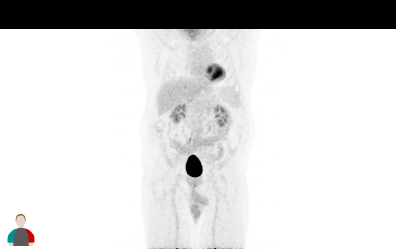
[im 16/48]
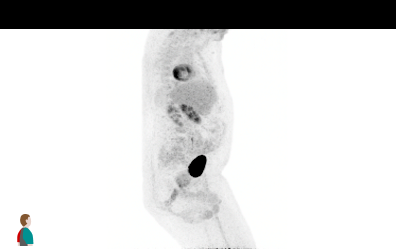
[im 32/48]
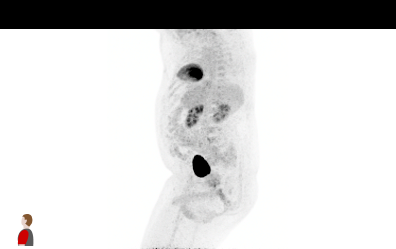
[im 48/48]
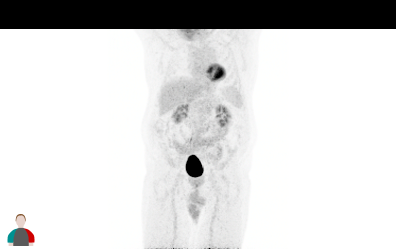

[22 of 25 positions shown; findings below may reference images not displayed]

FINDINGS: Mediastinal blood pool activity: SUV max

NECK: No hypermetabolic lymph nodes in the neck.

Incidental CT findings: none

CHEST:

No hypermetabolic pulmonary findings. Posterior left lower lobe
cm solid pulmonary nodule (series 3/image 131) is non hypermetabolic
with max SUV 0.8, stable in size since 06/10/2017 chest CT.

No hypermetabolic axillary, mediastinal or hilar adenopathy.

Incidental CT findings: Stable solid 6 mm right lower lobe pulmonary
nodule (series 3/image 109), below PET resolution. No acute
consolidative airspace disease, lung masses or new significant
pulmonary nodules. Coronary atherosclerosis. Atherosclerotic
nonaneurysmal thoracic aorta.

ABDOMEN/PELVIS: No abnormal hypermetabolic activity within the
liver, pancreas, adrenal glands, or spleen. No hypermetabolic lymph
nodes in the abdomen or pelvis.

Incidental CT findings: Nonobstructing 3 mm lower right and 4 mm
lower left renal stones. Atherosclerotic nonaneurysmal abdominal
aorta. Moderate hiatal hernia. Moderate sigmoid diverticulosis.
Marked prostatomegaly with nonspecific internal prostatic
calcifications. Mild diffuse bladder wall thickening.

SKELETON: No focal hypermetabolic activity to suggest skeletal
metastasis.

Incidental CT findings: none
IMPRESSION: 1. Left lower lobe 1.0 cm solid pulmonary nodule is non
hypermetabolic, more suggestive of a benign etiology. Recommend
continued chest CT surveillance in 6-12 months, as a low-grade
neoplasm cannot be excluded by PET.
2. No hypermetabolic disease.
3. Chronic findings include: Aortic Atherosclerosis (HICD8-WLS.S).
Coronary atherosclerosis. Nonobstructing bilateral nephrolithiasis.
Moderate hiatal hernia. Moderate sigmoid diverticulosis. Mild
diffuse bladder wall thickening, nonspecific, potentially due to
chronic bladder outlet obstruction by the markedly enlarged
prostate.

## 2020-06-01 ENCOUNTER — Other Ambulatory Visit (INDEPENDENT_AMBULATORY_CARE_PROVIDER_SITE_OTHER): Payer: Self-pay | Admitting: Internal Medicine

## 2020-06-15 DIAGNOSIS — Z20822 Contact with and (suspected) exposure to covid-19: Secondary | ICD-10-CM | POA: Diagnosis not present

## 2020-06-15 DIAGNOSIS — R030 Elevated blood-pressure reading, without diagnosis of hypertension: Secondary | ICD-10-CM | POA: Diagnosis not present

## 2020-08-23 DIAGNOSIS — Z20822 Contact with and (suspected) exposure to covid-19: Secondary | ICD-10-CM | POA: Diagnosis not present

## 2020-09-07 ENCOUNTER — Other Ambulatory Visit (INDEPENDENT_AMBULATORY_CARE_PROVIDER_SITE_OTHER): Payer: Self-pay | Admitting: Internal Medicine

## 2020-09-15 ENCOUNTER — Ambulatory Visit: Payer: PPO | Admitting: Internal Medicine

## 2020-09-18 DIAGNOSIS — Z6834 Body mass index (BMI) 34.0-34.9, adult: Secondary | ICD-10-CM | POA: Diagnosis not present

## 2020-09-18 DIAGNOSIS — H6593 Unspecified nonsuppurative otitis media, bilateral: Secondary | ICD-10-CM | POA: Diagnosis not present

## 2020-09-18 DIAGNOSIS — E6609 Other obesity due to excess calories: Secondary | ICD-10-CM | POA: Diagnosis not present

## 2020-11-07 DIAGNOSIS — J209 Acute bronchitis, unspecified: Secondary | ICD-10-CM | POA: Diagnosis not present

## 2020-11-07 DIAGNOSIS — Z6836 Body mass index (BMI) 36.0-36.9, adult: Secondary | ICD-10-CM | POA: Diagnosis not present

## 2020-11-07 DIAGNOSIS — G4733 Obstructive sleep apnea (adult) (pediatric): Secondary | ICD-10-CM | POA: Diagnosis not present

## 2020-11-10 ENCOUNTER — Other Ambulatory Visit (INDEPENDENT_AMBULATORY_CARE_PROVIDER_SITE_OTHER): Payer: Self-pay | Admitting: Internal Medicine

## 2021-01-05 ENCOUNTER — Other Ambulatory Visit: Payer: Self-pay | Admitting: *Deleted

## 2021-01-05 MED ORDER — ROSUVASTATIN CALCIUM 10 MG PO TABS: 10.0000 mg | ORAL_TABLET | Freq: Every day | ORAL | 0 refills | Status: DC

## 2021-01-11 ENCOUNTER — Encounter (INDEPENDENT_AMBULATORY_CARE_PROVIDER_SITE_OTHER): Payer: Self-pay | Admitting: Gastroenterology

## 2021-01-11 ENCOUNTER — Other Ambulatory Visit: Payer: Self-pay

## 2021-01-11 ENCOUNTER — Ambulatory Visit (INDEPENDENT_AMBULATORY_CARE_PROVIDER_SITE_OTHER): Payer: PPO | Admitting: Gastroenterology

## 2021-01-11 VITALS — BP 149/90 | HR 61 | Temp 98.7°F | Ht 71.0 in | Wt 264.5 lb

## 2021-01-11 DIAGNOSIS — D508 Other iron deficiency anemias: Secondary | ICD-10-CM | POA: Diagnosis not present

## 2021-01-11 DIAGNOSIS — R0989 Other specified symptoms and signs involving the circulatory and respiratory systems: Secondary | ICD-10-CM

## 2021-01-11 DIAGNOSIS — K219 Gastro-esophageal reflux disease without esophagitis: Secondary | ICD-10-CM

## 2021-01-11 MED ORDER — PANTOPRAZOLE SODIUM 40 MG PO TBEC: 40.0000 mg | DELAYED_RELEASE_TABLET | Freq: Every day | ORAL | 3 refills | Status: DC

## 2021-01-11 NOTE — Patient Instructions (Signed)
Please continue your pantoprazole 40mg  once daily Consider trialing allergy medication to see if this helps with the tickle in your throat/cough. If you develop worsening symptoms or new symptoms, please let me know. This could also be related to your acid reflux, make sure you are staying upright 2-3 hours after eating, avoiding spicy, greasy, tomato based foods, as well as caffeine, alcohol and chocolate.  Follow up 1 year

## 2021-01-11 NOTE — Progress Notes (Signed)
Referring Provider: Redmond School, MD Primary Care Physician:  Redmond School, MD Primary GI Physician: Rehman  Chief Complaint  Patient presents with   Gastroesophageal Reflux    Follow up on GERD, pt wants to discuss losing weight. Also has a cough that he would like checked.   HPI:   James Norris is a 71 y.o. male with past medical history of anxiety, GERD, hyperlipemia, kidney stones and sleep apnea, fatty liver and anemia secondary to impaired iron absorption.  Patient presenting today for follow up of GERD.  GERD: maintained on pantoprazole 40mg  once daily. States that he is doing well with current regimen, he eats most anything he wants, especially spicy foods. He states that he had a little issue with some barbecue going down a few days ago but otherwise has had no issues with dysphagia. He denies abdominal pain, early satiety, weight loss, or appetite changes.   Notably he tells me that he has had a tickle in his throat just at the sternal notch for a few weeks, causing him to cough, he has no other associated symptoms. He is concerned because he has had some friends recently diagnosed with throat cancer, as some family members with it as well. He wants to make sure the tickle/cough is not something more serious. States that symptoms occur mostly at night.   Anemia: previously thought related to impaired iron absorption from chronic PPI therapy. maintained on po iron, he is currently out and states he needs to pick up his refill. He denies fatigue or sob. No rectal bleeding or melena. Unable to review labs from most recent work up at PCP.   Last Colonoscopy:2014Prep excellent. Normal mucosa of terminal ileum. Normal mucosa of colon and rectum. Unremarkable anorectal junction.  Last Endoscopy: Feb 2017Healed erosive esophagitis. Schatzki's ring and moderate-sized sliding hiatal hernia. Schatzki's ring dilated/disrupted with balloon dilator as above. No evidence of  gastritis or peptic ulcer.  Past Medical History:  Diagnosis Date   Anxiety    GERD (gastroesophageal reflux disease)    Hyperlipemia    Kidney stone    Nephrolithiasis 01/06/2019   Sleep apnea    cpap    Past Surgical History:  Procedure Laterality Date   BALLOON DILATION N/A 03/11/2012   Procedure: BALLOON DILATION;  Surgeon: Rogene Houston, MD;  [facility];  Service: Endoscopy;  Laterality: N/A;   COLONOSCOPY WITH ESOPHAGOGASTRODUODENOSCOPY (EGD) N/A 03/11/2012   Procedure: COLONOSCOPY WITH ESOPHAGOGASTRODUODENOSCOPY (EGD);  Surgeon: Rogene Houston, MD;  [facility];  Service: Endoscopy;  Laterality: N/A;  Echo Left 01/25/2019   Procedure: CYSTOSCOPY WITH LEFT RETROGRADE PYELOGRAM;  Surgeon: Cleon Gustin, MD;  Location: [facility];  Service: Urology;  Laterality: Left;   CYSTOSCOPY W/ URETERAL STENT REMOVAL Left 01/25/2019   Procedure: CYSTOSCOPY WITH LEFT URETERAL STENT REMOVAL;  Surgeon: Cleon Gustin, MD;  Location: [facility];  Service: Urology;  Laterality: Left;   CYSTOSCOPY/URETEROSCOPY/HOLMIUM LASER/STENT PLACEMENT Left 01/06/2019   Procedure: CYSTOSCOPY/URETEROSCOPY//STENT PLACEMENT WITH LEFT RETROGRADE PYELOGRAM;  Surgeon: Cleon Gustin, MD;  [facility]: [facility];  Service: Urology;  Laterality: Left;   ESOPHAGEAL DILATION N/A 02/15/2015   Procedure: ESOPHAGEAL DILATION;  Surgeon: Rogene Houston, MD;  [facility];  Service: Endoscopy;  Laterality: N/A;   ESOPHAGOGASTRODUODENOSCOPY N/A 02/15/2015   Procedure: ESOPHAGOGASTRODUODENOSCOPY (EGD);  Surgeon: Rogene Houston, MD;  [facility];  Service: Endoscopy;  Laterality: N/A;  2:00   EXTRACORPOREAL  SHOCK WAVE LITHOTRIPSY Left 01/04/2019   Procedure: EXTRACORPOREAL SHOCK WAVE LITHOTRIPSY (ESWL);  Surgeon: Alexis Frock, MD;  Location: [facility];  Service: Urology;  Laterality: Left;   EYE SURGERY     lasik- bilaterally   TONSILLECTOMY      URETEROSCOPY Left 01/25/2019   Procedure: DIAGNOSTIC LEFT URETEROSCOPY;  Surgeon: Cleon Gustin, MD;  Location[facility];  Service: Urology;  Laterality: Left;   XI ROBOTIC ASSISTED SIMPLE PROSTATECTOMY N/A 04/05/2019   Procedure: XI ROBOTIC ASSISTED SIMPLE PROSTATECTOMY;  Surgeon: Cleon Gustin, MD;  [facility];  Service: Urology;  Laterality: N/A;  3 HRS    Current Outpatient Medications  Medication Sig Dispense Refill   ALPRAZolam (XANAX) 0.5 MG tablet Take 0.5 mg by mouth 3 (three) times daily as needed for anxiety.      ferrous sulfate 325 (65 FE) MG EC tablet Take 325 mg by mouth daily with breakfast.      Multiple Vitamin (MULTIVITAMIN) tablet Take 1 tablet by mouth daily.     Multiple Vitamins-Minerals (ZINC PO) Take by mouth.     pantoprazole (PROTONIX) 40 MG tablet Take 1 tablet (40 mg total) by mouth daily before breakfast. NEEDS OFFICE VISIT 90 tablet 0   rosuvastatin (CRESTOR) 10 MG tablet Take 1 tablet (10 mg total) by mouth daily. 90 tablet 0   tadalafil (CIALIS) 20 MG tablet Take 1 tablet (20 mg total) by mouth as needed for erectile dysfunction. 10 tablet 5   sildenafil (REVATIO) 20 MG tablet Take 20 mg by mouth 3 (three) times daily. (Patient not taking: Reported on 01/11/2021)     WEGOVY 2.4 MG/0.75ML SOAJ INJECT 0.65ml SUBCUTANEOUSLY EVERY WEEK ON THE same DAY OF EACH WEEK (Patient not taking: Reported on 01/11/2021)     No current facility-administered medications for this visit.    Allergies as of 01/11/2021   (No Known Allergies)    Family History  Problem Relation Age of Onset   Dementia Mother    Breast cancer Mother    Coronary artery disease Mother    Coronary artery disease Father    Hypertension Father    Diabetes type II Father    Hyperlipidemia Father    GER disease Father     Social History   Socioeconomic History   Marital status: Married    Spouse name: Not on file   Number of children: Not on file   Years of education: Not on  file   Highest education level: Not on file  Occupational History   Not on file  Tobacco Use   Smoking status: Former    Packs/day: 1.50    Types: Cigarettes    Quit date: 08/27/2006    Years since quitting: 14.3   Smokeless tobacco: Never   Tobacco comments:    quit 7 yrs ago  Vaping Use   Vaping Use: Never used  Substance and Sexual Activity   Alcohol use: Yes    Alcohol/week: 0.0 standard drinks    Comment: social rare   Drug use: No   Sexual activity: Yes  Other Topics Concern   Not on file  Social History Narrative   Not on file   Social Determinants of Health   Financial Resource Strain: Not on file  Food Insecurity: Not on file  Transportation Needs: Not on file  Physical Activity: Not on file  Stress: Not on file  Social Connections: Not on file   Review of systems General: negative for malaise, night sweats, fever,  chills, weight loss Neck: Negative for lumps, goiter, pain and significant neck swelling Resp: Negative for wheezing, dyspnea at rest +dry cough/throat clearing CV: Negative for chest pain, leg swelling, palpitations, orthopnea GI: denies melena, hematochezia, nausea, vomiting, diarrhea, constipation, dysphagia, odyonophagia, early satiety or unintentional weight loss.  MSK: Negative for joint pain or swelling, back pain, and muscle pain. Derm: Negative for itching or rash Psych: Denies depression, anxiety, memory loss, confusion. No homicidal or suicidal ideation.  Heme: Negative for prolonged bleeding, bruising easily, and swollen nodes. Endocrine: Negative for cold or heat intolerance, polyuria, polydipsia and goiter. Neuro: negative for tremor, gait imbalance, syncope and seizures. The remainder of the review of systems is noncontributory.  Physical Exam: BP (!) 155/101 (BP Location: Right Arm, Patient Position: Sitting, Cuff Size: Large)    Pulse 61    Temp 98.7 F (37.1 C) (Oral)    Ht 5\' 11"  (1.803 m)    Wt 264 lb 8 oz (120 kg)    BMI 36.89  kg/m  General:   Alert and oriented. No distress noted. Pleasant and cooperative.  Head:  Normocephalic and atraumatic. Eyes:  Conjuctiva clear without scleral icterus. Mouth:  Oral mucosa pink and moist. Good dentition. No lesions. No visible lesions/tumors on inspection of oral cavity and throat. Heart: Normal rate and rhythm, s1 and s2 heart sounds present.  Lungs: Clear lung sounds in all lobes. Respirations equal and unlabored. Abdomen:  +BS, soft, non-tender and non-distended. No rebound or guarding. No HSM or masses noted. Derm: No palmar erythema or jaundice Msk:  Symmetrical without gross deformities. Normal posture. Extremities:  Without edema. Neurologic:  Alert and  oriented x4 Psych:  Alert and cooperative. Normal mood and affect.  Invalid input(s): 6 MONTHS   ASSESSMENT: AHMARION SARACENO is a 71 y.o. male presenting today for yearly follow up of GERD and anemia.  Doing well on pantoprazole 40mg  once daily, he denies any episodes of acid reflux, and typically eats most anything he wants. Very rare occurrences of dysphagia, usually with thicker meats if it does occur. He is concerned about recently developed "tickle" in his throat requiring coughing or throat clearing, mostly at night. He endorses much concern about this as he knows multiple people who have had/have throat cancer, reassuringly he has no other associated symptoms, no obvious lesions noted on exam. I suspect this could be related to either allergies or some aspect of his GERD even though he is without typical reflux symptoms. I advised him to try and OTC allergy medication and make sure he is doing reflux precautions with staying upright 2-3 hours after eating and avoiding trigger foods/drinks. He will let me know if he develops any new or worsening symptoms related to this.  As far as his anemia, he has had no fatigue, sob or dizziness, no rectal bleeding or melena. Currently maintained on ferrous sulfate 325mg  but he  has been out recently and will need to pick up his refill. He had labs with PCP within the past few months but he is unsure exactly when, we will try to obtain these for our records and for continued following of his Anemia. He will let me know if he develops rectal bleeding, melena, fatigue, sob or dizziness.    PLAN:  Continue on PPI once daily 2. Will obtain most recent labs from PCP 3. Patient to let me know if he has worsening symptoms of cough/tickle in his throat or dysphagia 4. Continue supplemental PO iron 5. Reflux precautions  6. Otc allergy medication   Follow Up: 1 year  James Friedhoff L. Alver Sorrow, MSN, APRN, AGNP-C Adult-Gerontology Nurse Practitioner [facility]

## 2021-01-12 DIAGNOSIS — R0989 Other specified symptoms and signs involving the circulatory and respiratory systems: Secondary | ICD-10-CM | POA: Insufficient documentation

## 2021-01-17 ENCOUNTER — Ambulatory Visit: Payer: PPO | Admitting: Urology

## 2021-01-17 ENCOUNTER — Encounter: Payer: Self-pay | Admitting: Urology

## 2021-01-17 ENCOUNTER — Other Ambulatory Visit: Payer: Self-pay

## 2021-01-17 VITALS — BP 138/82 | HR 74 | Wt 265.0 lb

## 2021-01-17 DIAGNOSIS — N138 Other obstructive and reflux uropathy: Secondary | ICD-10-CM

## 2021-01-17 DIAGNOSIS — R3914 Feeling of incomplete bladder emptying: Secondary | ICD-10-CM | POA: Diagnosis not present

## 2021-01-17 DIAGNOSIS — N401 Enlarged prostate with lower urinary tract symptoms: Secondary | ICD-10-CM

## 2021-01-17 DIAGNOSIS — R3915 Urgency of urination: Secondary | ICD-10-CM | POA: Diagnosis not present

## 2021-01-17 LAB — URINALYSIS, ROUTINE W REFLEX MICROSCOPIC
Bilirubin, UA: NEGATIVE
Glucose, UA: NEGATIVE
Ketones, UA: NEGATIVE
Leukocytes,UA: NEGATIVE
Nitrite, UA: NEGATIVE
Protein,UA: NEGATIVE
RBC, UA: NEGATIVE
Specific Gravity, UA: >1.03 — ABNORMAL HIGH (ref 1.005–1.030)
Urobilinogen, Ur: 0.2 mg/dL (ref 0.2–1.0)
pH, UA: 5 (ref 5.0–7.5)

## 2021-01-17 MED ORDER — VARDENAFIL HCL 20 MG PO TABS: 20.0000 mg | ORAL_TABLET | Freq: Every day | ORAL | 0 refills | Status: DC | PRN

## 2021-01-17 MED ORDER — TADALAFIL 20 MG PO TABS: 20.0000 mg | ORAL_TABLET | ORAL | 5 refills | Status: DC | PRN

## 2021-01-17 NOTE — Progress Notes (Signed)
01/17/2021 9:51 AM   James Norris [birth year] [id number]  Referring provider: Redmond School, MD [address] [place],  [place] [address]  Followup erectile dysfunction and urinary urgency   HPI: James Norris is a 71yo here for followup for urinary urgency and erectile dysfunction. IPSS 1 QOL 0. He uses tadalafil 20mg  prn with decent results. Urinary urgency is improved significantly since last visit. No recent PSA.    PMH: Past Medical History:  Diagnosis Date   Anxiety    GERD (gastroesophageal reflux disease)    Hyperlipemia    Kidney stone    Nephrolithiasis 01/06/2019   Sleep apnea    cpap    Surgical History: Past Surgical History:  Procedure Laterality Date   BALLOON DILATION N/A 03/11/2012   Procedure: BALLOON DILATION;  Surgeon: Rogene Houston, MD;  [facility];  Service: Endoscopy;  Laterality: N/A;   COLONOSCOPY WITH ESOPHAGOGASTRODUODENOSCOPY (EGD) N/A 03/11/2012   Procedure: COLONOSCOPY WITH ESOPHAGOGASTRODUODENOSCOPY (EGD);  Surgeon: Rogene Houston, MD;  [facility];  Service: Endoscopy;  Laterality: N/A;  [place] Left 01/25/2019   Procedure: CYSTOSCOPY WITH LEFT RETROGRADE PYELOGRAM;  Surgeon: Cleon Gustin, MD;  [facility];  Service: Urology;  Laterality: Left;   CYSTOSCOPY W/ URETERAL STENT REMOVAL Left 01/25/2019   Procedure: CYSTOSCOPY WITH LEFT URETERAL STENT REMOVAL;  Surgeon: Cleon Gustin, MD;  [facility];  Service: Urology;  Laterality: Left;   CYSTOSCOPY/URETEROSCOPY/HOLMIUM LASER/STENT PLACEMENT Left 01/06/2019   Procedure: CYSTOSCOPY/URETEROSCOPY//STENT PLACEMENT WITH LEFT RETROGRADE PYELOGRAM;  Surgeon: Cleon Gustin, MD;  [facility];  Service: Urology;  Laterality: Left;   ESOPHAGEAL DILATION N/A 02/15/2015   Procedure: ESOPHAGEAL DILATION;  Surgeon: Rogene Houston, MD;  [facility];  Service: Endoscopy;  Laterality: N/A;    ESOPHAGOGASTRODUODENOSCOPY N/A 02/15/2015   Procedure: ESOPHAGOGASTRODUODENOSCOPY (EGD);  Surgeon: Rogene Houston, MD;  [facility];  Service: Endoscopy;  Laterality: N/A;  2:00   EXTRACORPOREAL SHOCK WAVE LITHOTRIPSY Left 01/04/2019   Procedure: EXTRACORPOREAL SHOCK WAVE LITHOTRIPSY (ESWL);  Surgeon: Alexis Frock, MD;  [facility];  Service: Urology;  Laterality: Left;   EYE SURGERY     lasik- bilaterally   TONSILLECTOMY     URETEROSCOPY Left 01/25/2019   Procedure: DIAGNOSTIC LEFT URETEROSCOPY;  Surgeon: Cleon Gustin, MD;  Location: [facility];  Service: Urology;  Laterality: Left;   XI ROBOTIC ASSISTED SIMPLE PROSTATECTOMY N/A 04/05/2019   Procedure: XI ROBOTIC ASSISTED SIMPLE PROSTATECTOMY;  Surgeon: Cleon Gustin, MD;  [facility];  Service: Urology;  Laterality: N/A;  3 HRS    Home Medications:  Allergies as of 01/17/2021   No Known Allergies      Medication List        Accurate as of January 17, 2021  9:51 AM. If you have any questions, ask your nurse or doctor.          ALPRAZolam 0.5 MG tablet Commonly known as: XANAX Take 0.5 mg by mouth 3 (three) times daily as needed for anxiety.   ferrous sulfate 325 (65 FE) MG EC tablet Take 325 mg by mouth daily with breakfast.   multivitamin tablet Take 1 tablet by mouth daily.   pantoprazole 40 MG tablet Commonly known as: PROTONIX Take 1 tablet (40 mg total) by mouth daily before breakfast. NEEDS OFFICE VISIT   rosuvastatin 10 MG tablet Commonly known as: CRESTOR Take 1 tablet (10 mg total) by mouth daily.  sildenafil 20 MG tablet Commonly known as: REVATIO Take 20 mg by mouth 3 (three) times daily.   tadalafil 20 MG tablet Commonly known as: CIALIS Take 1 tablet (20 mg total) by mouth as needed for erectile dysfunction.   Wegovy 2.4 MG/0.75ML Soaj Generic drug: Semaglutide-Weight Management   ZINC PO Take by mouth.        Allergies: No Known Allergies  Family  History: Family History  Problem Relation Age of Onset   Dementia Mother    Breast cancer Mother    Coronary artery disease Mother    Coronary artery disease Father    Hypertension Father    Diabetes type II Father    Hyperlipidemia Father    GER disease Father     Social History:  reports that he quit smoking about 14 years ago. His smoking use included cigarettes. He smoked an average of 1.5 packs per day. He has never used smokeless tobacco. He reports current alcohol use. He reports that he does not use drugs.  ROS: All other review of systems were reviewed and are negative except what is noted above in HPI  Physical Exam: BP 138/82    Pulse 74    Wt 265 lb (120.2 kg)    BMI 36.96 kg/m   Constitutional:  Alert and oriented, No acute distress. HEENT: [place] AT, moist mucus membranes.  Trachea midline, no masses. Cardiovascular: No clubbing, cyanosis, or edema. Respiratory: Normal respiratory effort, no increased work of breathing. GI: Abdomen is soft, nontender, nondistended, no abdominal masses GU: No CVA tenderness.  Lymph: No cervical or inguinal lymphadenopathy. Skin: No rashes, bruises or suspicious lesions. Neurologic: Grossly intact, no focal deficits, moving all 4 extremities. Psychiatric: Normal mood and affect.  Laboratory Data: Lab Results  Component Value Date   WBC 12.4 (H) 04/08/2019   HGB 14.9 04/08/2019   HCT 45.3 04/08/2019   MCV 88.8 04/08/2019   PLT 236 04/08/2019    Lab Results  Component Value Date   CREATININE 0.94 04/08/2019    No results found for: PSA  No results found for: TESTOSTERONE  No results found for: HGBA1C  Urinalysis    Component Value Date/Time   COLORURINE YELLOW 01/05/2019 2056   APPEARANCEUR Clear 01/19/2020 1003   LABSPEC 1.017 01/05/2019 2056   PHURINE 5.0 01/05/2019 2056   GLUCOSEU Negative 01/19/2020 1003   HGBUR LARGE (A) 01/05/2019 2056   BILIRUBINUR Negative 01/19/2020 1003   KETONESUR 20 (A) 01/05/2019 2056    PROTEINUR Negative 01/19/2020 1003   PROTEINUR 30 (A) 01/05/2019 2056   UROBILINOGEN 0.2 05/24/2019 1504   UROBILINOGEN 0.2 03/27/2010 1655   NITRITE Negative 01/19/2020 1003   NITRITE NEGATIVE 01/05/2019 2056   LEUKOCYTESUR Negative 01/19/2020 1003   LEUKOCYTESUR SMALL (A) 01/05/2019 2056    Lab Results  Component Value Date   LABMICR Comment 01/19/2020   BACTERIA RARE (A) 01/05/2019    Pertinent Imaging:  Results for orders placed during the hospital encounter of 01/04/19  DG Abd 1 View  Narrative CLINICAL DATA:  Left flank pain.  History of left ureteral calculus.  EXAM: ABDOMEN - 1 VIEW  COMPARISON:  CT scan 01/02/2019  FINDINGS: Stable appearing position of the left ureteral calculus located adjacent to the L4 vertebral body. Stable lower pole right renal calculus.  The bowel gas pattern is unremarkable. Scattered air in stool noted in the colon. No free air.  IMPRESSION: 1. Stable mid left ureteral calculus. 2. Right lower pole renal calculus. 3. Unremarkable bowel  gas pattern.   Electronically Signed By: Marijo Sanes M.D. On: 01/04/2019 06:39  No results found for this or any previous visit.  No results found for this or any previous visit.  No results found for this or any previous visit.  Results for orders placed during the hospital encounter of 02/26/19  US RENAL  Narrative CLINICAL DATA:  Nephrolithiasis.  Gross hematuria.  EXAM: RENAL / URINARY TRACT ULTRASOUND COMPLETE  COMPARISON:  CT, 01/02/2019  FINDINGS: Right Kidney:  Renal measurements: 13.0 x 7.2 x 7.5 cm = volume: 365.3 mL. Normal parenchymal echogenicity. No masses. Shadowing echogenic stone in the lower pole, 8 mm. No hydronephrosis.  Left Kidney:  Renal measurements: 13.3 x 7.7 x 6.8 cm = volume: 364.8 mL. Normal parenchymal echogenicity. No masses. Shadowing echogenic stone in the lower pole, 10 mm. No hydronephrosis.  Bladder:  Mildly distended. There are  calcifications projecting at the bladder base. No mass.  Other:  Enlarged prostate, volume calculated at 288 mL.  IMPRESSION: 1. No acute findings.  No hydronephrosis. 2. Nonobstructing stones in the lower pole of each kidney, on the right present on the prior CT, not seen on the prior CT in the left kidney. 3. Stones project at the bladder base.  No bladder mass. 4. Significant prosthetic enlargement.   Electronically Signed By: Lajean Manes M.D. On: 02/26/2019 15:53  No results found for this or any previous visit.  No results found for this or any previous visit.  Results for orders placed during the hospital encounter of 01/02/19  CT Renal Stone Study  Narrative CLINICAL DATA:  Left flank pain.  EXAM: CT ABDOMEN AND PELVIS WITHOUT CONTRAST  TECHNIQUE: Multidetector CT imaging of the abdomen and pelvis was performed following the standard protocol without IV contrast.  COMPARISON:  03/27/2010  FINDINGS: Lower chest: 10 mm lobular nodule in the left lower lobe is stable compared to chest CT of 08/11/2018. This nodule measured 9 mm on the cardiac CT of 10/17/2017 and was 5 mm on a chest CT of 02/11/2012.  Hepatobiliary: No suspicious focal abnormality within the liver parenchyma. There is no evidence for gallstones, gallbladder wall thickening, or pericholecystic fluid. No intrahepatic or extrahepatic biliary dilation.  Pancreas: No focal mass lesion. No dilatation of the main duct. No intraparenchymal cyst. No peripancreatic edema.  Spleen: No splenomegaly. No focal mass lesion.  Adrenals/Urinary Tract: No adrenal nodule or mass. 5 mm nonobstructing stone noted lower pole right kidney. No right ureteral stone. No secondary changes in the right kidney or ureter. No left renal stones although there is mild left hydronephrosis. 7 x 7 x 4 mm stone identified in the proximal left ureter (axial 46/series 2). No other left urinary stone. 2.1 cm water  density lesion lower pole left kidney is compatible with a cyst. No bladder stone.  Stomach/Bowel: Small to moderate hiatal hernia. Duodenum is normally positioned as is the ligament of Treitz. No small bowel wall thickening. No small bowel dilatation. The terminal ileum is normal. The appendix is best seen on coronal images and is unremarkable. No gross colonic mass. No colonic wall thickening. Diverticular changes are noted in the left colon without evidence of diverticulitis.  Vascular/Lymphatic: There is abdominal aortic atherosclerosis without aneurysm. There is no gastrohepatic or hepatoduodenal ligament lymphadenopathy. No retroperitoneal or mesenteric lymphadenopathy. No pelvic sidewall lymphadenopathy.  Reproductive: Prostate gland is markedly enlarged.  Other: No intraperitoneal free fluid.  Musculoskeletal: No worrisome lytic or sclerotic osseous abnormality.  IMPRESSION: 1. 7 x  7 x 4 mm stone identified in the proximal left ureter with mild left hydronephrosis. 2. 5 mm nonobstructing right renal stone. No other urinary stone disease evident on today's study. 3. 10 mm lobular nodule left lower lobe has shown slow growth comparing back over almost 7 years to a study from 2014. Given the growth characteristics, this is probably benign. Conservatively, follow-up CT chest in 12 months could be used to reassess. 4. Small to moderate hiatal hernia. 5. Marked prostatomegaly. 6.  Aortic Atherosclerois (ICD10-170.0)   Electronically Signed By: Misty Stanley M.D. On: 01/02/2019 10:53   Assessment & Plan:    1. Urinary urgency -resolved - Urinalysis, Routine w reflex microscopic  2. Benign prostatic hyperplasia with incomplete bladder emptying -improved after simple prostatectomy  3. Erectile dysfunction -we will trial levitra 20mg  prn   No follow-ups on file.  [name], MD  [facility] [place]

## 2021-01-17 NOTE — Progress Notes (Signed)

## 2021-01-17 NOTE — Patient Instructions (Signed)
Erectile Dysfunction °Erectile dysfunction (ED) is the inability to get or keep an erection in order to have sexual intercourse. ED is considered a symptom of an underlying disorder and is not considered a disease. ED may include: °Inability to get an erection. °Lack of enough hardness of the erection to allow penetration. °Loss of erection before sex is finished. °What are the causes? °This condition may be caused by: °Physical causes, such as: °Artery problems. This may include heart disease, high blood pressure, atherosclerosis, and diabetes. °Hormonal problems, such as low testosterone. °Obesity. °Nerve problems. This may include back or pelvic injuries, multiple sclerosis, Parkinson's disease, spinal cord injury, and stroke. °Certain medicines, such as: °Pain relievers. °Antidepressants. °Blood pressure medicines and water pills (diuretics). °Cancer medicines. °Antihistamines. °Muscle relaxants. °Lifestyle factors, such as: °Use of drugs such as marijuana, cocaine, or opioids. °Excessive use of alcohol. °Smoking. °Lack of physical activity or exercise. °Psychological causes, such as: °Anxiety or stress. °Sadness or depression. °Exhaustion. °Fear about sexual performance. °Guilt. °What are the signs or symptoms? °Symptoms of this condition include: °Inability to get an erection. °Lack of enough hardness of the erection to allow penetration. °Loss of the erection before sex is finished. °Sometimes having normal erections, but with frequent unsatisfactory episodes. °Low sexual satisfaction in either partner due to erection problems. °A curved penis occurring with erection. The curve may cause pain, or the penis may be too curved to allow for intercourse. °Never having nighttime or morning erections. °How is this diagnosed? °This condition is often diagnosed by: °Performing a physical exam to find other diseases or specific problems with the penis. °Asking you detailed questions about the problem. °Doing tests,  such as: °Blood tests to check for diabetes mellitus or high cholesterol, or to measure hormone levels. °Other tests to check for underlying health conditions. °An ultrasound exam to check for scarring. °A test to check blood flow to the penis. °Doing a sleep study at home to measure nighttime erections. °How is this treated? °This condition may be treated by: °Medicines, such as: °Medicine taken by mouth to help you achieve an erection (oral medicine). °Hormone replacement therapy to replace low testosterone levels. °Medicine that is injected into the penis. Your health care provider may instruct you how to give yourself these injections at home. °Medicine that is delivered with a short applicator tube. The tube is inserted into the opening at the tip of the penis, which is the opening of the urethra. A tiny pellet of medicine is put in the urethra. The pellet dissolves and enhances erectile function. This is also called MUSE (medicated urethral system for erections) therapy. °Vacuum pump. This is a pump with a ring on it. The pump and ring are placed on the penis and used to create pressure that helps the penis become erect. °Penile implant surgery. In this procedure, you may receive: °An inflatable implant. This consists of cylinders, a pump, and a reservoir. The cylinders can be inflated with a fluid that helps to create an erection, and they can be deflated after intercourse. °A semi-rigid implant. This consists of two silicone rubber rods. The rods provide some rigidity. They are also flexible, so the penis can both curve downward in its normal position and become straight for sexual intercourse. °Blood vessel surgery to improve blood flow to the penis. During this procedure, a blood vessel from a different part of the body is placed into the penis to allow blood to flow around (bypass) damaged or blocked blood vessels. °Lifestyle changes,   such as exercising more, losing weight, and quitting smoking. °Follow  these instructions at home: °Medicines ° °Take over-the-counter and prescription medicines only as told by your health care provider. Do not increase the dosage without first discussing it with your health care provider. °If you are using self-injections, do injections as directed by your health care provider. Make sure you avoid any veins that are on the surface of the penis. After giving an injection, apply pressure to the injection site for 5 minutes. °Talk to your health care provider about how to prevent headaches while taking ED medicines. These medicines may cause a sudden headache due to the increase in blood flow in your body. °General instructions °Exercise regularly, as directed by your health care provider. Work with your health care provider to lose weight, if needed. °Do not use any products that contain nicotine or tobacco. These products include cigarettes, chewing tobacco, and vaping devices, such as e-cigarettes. If you need help quitting, ask your health care provider. °Before using a vacuum pump, read the instructions that come with the pump and discuss any questions with your health care provider. °Keep all follow-up visits. This is important. °Contact a health care provider if: °You feel nauseous. °You are vomiting. °You get sudden headaches while taking ED medicines. °You have any concerns about your sexual health. °Get help right away if: °You are taking oral or injectable medicines and you have an erection that lasts longer than 4 hours. If your health care provider is unavailable, go to the nearest emergency room for evaluation. An erection that lasts much longer than 4 hours can result in permanent damage to your penis. °You have severe pain in your groin or abdomen. °You develop redness or severe swelling of your penis. °You have redness spreading at your groin or lower abdomen. °You are unable to urinate. °You experience chest pain or a rapid heartbeat (palpitations) after taking oral  medicines. °These symptoms may represent a serious problem that is an emergency. Do not wait to see if the symptoms will go away. Get medical help right away. Call your local emergency services (911 in the [place] Do not drive yourself to the hospital. °Summary °Erectile dysfunction (ED) is the inability to get or keep an erection during sexual intercourse. °This condition is diagnosed based on a physical exam, your symptoms, and tests to determine the cause. Treatment varies depending on the cause and may include medicines, hormone therapy, surgery, or a vacuum pump. °You may need follow-up visits to make sure that you are using your medicines or devices correctly. °Get help right away if you are taking or injecting medicines and you have an erection that lasts longer than 4 hours. °This information is not intended to replace advice given to you by your health care provider. Make sure you discuss any questions you have with your health care provider. °Document Revised: 03/22/2020 Document Reviewed: 03/22/2020 °Elsevier Patient Education © 2022 [employer] ° °

## 2021-01-18 LAB — PSA: Prostate Specific Ag, Serum: 1.5 ng/mL (ref 0.0–4.0)

## 2021-02-14 NOTE — Progress Notes (Signed)
Cardiology Office Note  Date: 02/14/2021   ID: ANAKIN VARKEY, DOB 03/23/1950, MRN [id number]  PCP:  Redmond School, MD  Cardiologist:  Rozann Lesches, MD Electrophysiologist:  None   Chief Complaint: Dyspnea   History of Present Illness: James Norris is a 71 y.o. male with a history of Coronary artery calcification on CT, Hyperlipidemia, lung nodules, GERD, OSA, HLD, moderate hiatal hernia, pulmonary hypertension, history of prostate surgery. Added on to schedule for dyspnea  He stopped smoking 5 years ago but smoked 3 ppd prior to this   Cardiac CTA 10/17/17 calcium score 221 no obstructive CAD 1-24%  in vessels  Myovue 12/24/19 with no ischemia EF 55-60% TTE 12/24/19 EF 70-75% Normal RV normal MV/AV No signs of pulmonary HTN  Medicare screening no AAA 03/03/20   CTA 04/08/19 no PE moderate hiatal hernia 10 mm LLL nodule stable since 2019 PET negative at that time   Taking statin 3x/week with LDL 88 Dyspnea with fatigue and exercise intolerance COVID positive 02/03/20   Sees urology for ED and frequency uses Cialis/Levitra   Weight is up 30 lbs this year he eats too much pasta and goes on cruises. He has had dyspnea at altitude in [place] No chest pain      Past Surgical History:  Procedure Laterality Date   BALLOON DILATION N/A 03/11/2012   Procedure: BALLOON DILATION;  Surgeon: Rogene Houston, MD;  [facility];  Service: Endoscopy;  Laterality: N/A;   COLONOSCOPY WITH ESOPHAGOGASTRODUODENOSCOPY (EGD) N/A 03/11/2012   Procedure: COLONOSCOPY WITH ESOPHAGOGASTRODUODENOSCOPY (EGD);  Surgeon: Rogene Houston, MD;  Location: [facility];  Service: Endoscopy;  Laterality: N/A;  [place] Left 01/25/2019   Procedure: CYSTOSCOPY WITH LEFT RETROGRADE PYELOGRAM;  Surgeon: Cleon Gustin, MD;  Location: [facility];  Service: Urology;  Laterality: Left;   CYSTOSCOPY W/ URETERAL STENT REMOVAL Left 01/25/2019   Procedure: CYSTOSCOPY WITH LEFT  URETERAL STENT REMOVAL;  Surgeon: Cleon Gustin, MD;  Location: [facility];  Service: Urology;  Laterality: Left;   CYSTOSCOPY/URETEROSCOPY/HOLMIUM LASER/STENT PLACEMENT Left 01/06/2019   Procedure: CYSTOSCOPY/URETEROSCOPY//STENT PLACEMENT WITH LEFT RETROGRADE PYELOGRAM;  Surgeon: Cleon Gustin, MD;  Location: [facility];  Service: Urology;  Laterality: Left;   ESOPHAGEAL DILATION N/A 02/15/2015   Procedure: ESOPHAGEAL DILATION;  Surgeon: Rogene Houston, MD;  [facility];  Service: Endoscopy;  Laterality: N/A;   ESOPHAGOGASTRODUODENOSCOPY N/A 02/15/2015   Procedure: ESOPHAGOGASTRODUODENOSCOPY (EGD);  Surgeon: Rogene Houston, MD;  [facility];  Service: Endoscopy;  Laterality: N/A;  2:00   EXTRACORPOREAL SHOCK WAVE LITHOTRIPSY Left 01/04/2019   Procedure: EXTRACORPOREAL SHOCK WAVE LITHOTRIPSY (ESWL);  Surgeon: Alexis Frock, MD;  Location: [facility];  Service: Urology;  Laterality: Left;   EYE SURGERY     lasik- bilaterally   TONSILLECTOMY     URETEROSCOPY Left 01/25/2019   Procedure: DIAGNOSTIC LEFT URETEROSCOPY;  Surgeon: Cleon Gustin, MD;  Location: [facility];  Service: Urology;  Laterality: Left;   XI ROBOTIC ASSISTED SIMPLE PROSTATECTOMY N/A 04/05/2019   Procedure: XI ROBOTIC ASSISTED SIMPLE PROSTATECTOMY;  Surgeon: Cleon Gustin, MD;  Location: [facility];  Service: Urology;  Laterality: N/A;  3 HRS    Current Outpatient Medications  Medication Sig Dispense Refill   ALPRAZolam (XANAX) 0.5 MG tablet Take 0.5 mg by mouth 3 (three) times daily as needed for anxiety.      ferrous sulfate 325 (65 FE) MG EC tablet Take  325 mg by mouth daily with breakfast.      Multiple Vitamin (MULTIVITAMIN) tablet Take 1 tablet by mouth daily.     Multiple Vitamins-Minerals (ZINC PO) Take by mouth.     pantoprazole (PROTONIX) 40 MG tablet Take 1 tablet (40 mg total) by mouth daily before breakfast. NEEDS OFFICE VISIT 90 tablet 3   rosuvastatin (CRESTOR) 10 MG tablet Take 1  tablet (10 mg total) by mouth daily. 90 tablet 0   sildenafil (REVATIO) 20 MG tablet Take 20 mg by mouth 3 (three) times daily.     tadalafil (CIALIS) 20 MG tablet Take 1 tablet (20 mg total) by mouth as needed for erectile dysfunction. 10 tablet 5   vardenafil (LEVITRA) 20 MG tablet Take 1 tablet (20 mg total) by mouth daily as needed for erectile dysfunction. 6 tablet 0   WEGOVY 2.4 MG/0.75ML SOAJ      No current facility-administered medications for this visit.   Allergies:  Patient has no known allergies.   Social History: The patient  reports that he quit smoking about 14 years ago. His smoking use included cigarettes. He smoked an average of 1.5 packs per day. He has never used smokeless tobacco. He reports current alcohol use. He reports that he does not use drugs.   Family History: The patient's family history includes Breast cancer in his mother; Coronary artery disease in his father and mother; Dementia in his mother; Diabetes type II in his father; GER disease in his father; Hyperlipidemia in his father; Hypertension in his father.   ROS:  Please see the history of present illness. Otherwise, complete review of systems is positive for none.  All other systems are reviewed and negative.   Physical Exam: VS:  There were no vitals taken for this visit., BMI There is no height or weight on file to calculate BMI.  Wt Readings from Last 3 Encounters:  01/17/21 265 lb (120.2 kg)  01/11/21 264 lb 8 oz (120 kg)  02/03/20 235 lb (106.6 kg)    Affect appropriate Healthy:  appears stated age 71: normal Neck supple with no adenopathy JVP normal no bruits no thyromegaly Lungs clear with no wheezing and good diaphragmatic motion Heart:  S1/S2 no murmur, no rub, gallop or click PMI normal Abdomen: benighn, BS positve, no tenderness, no AAA no bruit.  No HSM or HJR Distal pulses intact with no bruits No edema Neuro non-focal Skin warm and dry No muscular weakness   ECG:  SR rate  72 RBBB 12/16/19   Recent Labwork: No results found for requested labs within last 8760 hours.  No results found for: CHOL, TRIG, HDL, CHOLHDL, VLDL, LDLCALC, LDLDIRECT  Other Studies Reviewed Today:  NST 12/24/2019 Study Result  Narrative & Impression  Findings consistent with small prior apical myocardial infarction with very mild peri-infarct ischemia. This is a low risk study. The left ventricular ejection fraction is normal (55-65%). There was no ST segment deviation noted during stress.       Echocardiogram 12/24/2019 1. Left ventricular ejection fraction, by estimation, is 70 to 75%. The left ventricle has hyperdynamic function. The left ventricle has no regional wall motion abnormalities. There is mild left ventricular hypertrophy. Left ventricular diastolic parameters are indeterminate. 2. Right ventricular systolic function is normal. The right ventricular size is normal. 3. The mitral valve is normal in structure. No evidence of mitral valve regurgitation. No evidence of mitral stenosis. 4. The aortic valve is tricuspid. There is mild calcification of the aortic  valve. There is mild thickening of the aortic valve. Aortic valve regurgitation is not visualized. No aortic stenosis is present. 5. The inferior vena cava is normal in size with greater than 50% respiratory variability, suggesting right atrial pressure of 3 mmHg.    Echocardiogram: 07/2017 Study Conclusions   - Left ventricle: The cavity size was normal. Wall thickness was   increased in a pattern of mild LVH. Systolic function was normal.   The estimated ejection fraction was in the range of 60% to 65%.   Wall motion was normal; there were no regional wall motion   abnormalities. Left ventricular diastolic function parameters   were normal for the patient&'s age. - Aortic valve: Mildly calcified annulus. Probably trileaflet;   mildly calcified leaflets. - Mitral valve: Mildly calcified annulus. There  was trivial   regurgitation. - Left atrium: The atrium was at the upper limits of normal in   size. - Right atrium: Central venous pressure (est): 3 mm Hg. - Atrial septum: No defect or patent foramen ovale was identified. - Tricuspid valve: There was trivial regurgitation. - Pulmonary arteries: PA peak pressure: 17 mm Hg (S). - Pericardium, extracardiac: There was no pericardial effusion.   Coronary CT: 10/17/2017 Aorta:  Normal size.  No calcifications.  No dissection.   Aortic Valve:  Trileaflet.  Mild calcification of the aortic valve.   Coronary Arteries:  Normal coronary origin.  Right dominance.   RCA is a small, co-dominant artery. There is mild (1-24%) mixed calcified and noncalcified plaque proximally.   Left main is a large artery that gives rise to LAD and LCX arteries. There is mild (1-24%) calcified plaque.   LAD is a large vessel that gives rise to a small D1 and normal D2. It has mild (1-24%) calcified plaque in the mid LAD just distal to D2. D2 has mild (1-24%) calcified plaque proximally.   LCX is a co-dominant artery that gives rise a large OM1 and small OM2 branches. There is mild (1-24%) diffuse mixed plaque in the LCX and OM1.   Other findings:   Normal pulmonary vein drainage into the left atrium.   Normal let atrial appendage without a thrombus.   Normal size of the pulmonary artery.   IMPRESSION: 1. Coronary calcium score of 221. This was 34 percentile for age and sex matched control.   2. Normal coronary origin with right dominance.   3.  Mild, non-obstructive plaque in all coronary distributions.   4. Recommend aggressive risk factor modification and high-potency statin.      Assessment and Plan:    1. Coronary artery calcification seen on CT scan Non obstructive CAD and normal myovue 12/24/19 ASA and statin  2. Mixed hyperlipidemia Currently taking Crestor 10 mg 3 times weekly labs with primary He has not tolerated lipitor   3.  Pulmonary nodule/Dyspnea  History of pulmonary nodule. CT of the chest on 04/08/2019 secondary to complaint of chest pain performed at [facility] emergency department.  A 10 mm left lower lobe pulmonary nodule was noted which had not appreciably changed since 2019 were shown to be PET negative.  Report stated it was likely benign.Dyspnea related to being overweight previous 3 ppd smoking and altitude Check echo to make sure EF still normal   4.  Pulmonary hypertension Currently taking sildenafil 20 mg p.o. 3 times daily for pulmonary hypertension.  Complaining of increased dyspnea recently.   10/24/19 echocardiogram showed EF of 70-75%.  No WMA's.  Mild LVH.  RV systolic  function was normal and of normal size.  No mention of pulmonary hypertension.He indicates not taking revatio Discussed only using one of listed pills for ED  5.  Screening for abdominal aortic aneurysm. Negative duplex 03/02/20   Medication Adjustments/Labs and Tests Ordered: Current medicines are reviewed at length with the patient today.  Concerns regarding medicines are outlined above.  Echo for dyspnea   Disposition: Follow-up with Dr. Domenic Polite

## 2021-02-15 ENCOUNTER — Encounter: Payer: Self-pay | Admitting: Cardiovascular Disease

## 2021-02-15 ENCOUNTER — Ambulatory Visit: Payer: PPO | Admitting: Cardiovascular Disease

## 2021-02-15 ENCOUNTER — Other Ambulatory Visit: Payer: Self-pay

## 2021-02-15 VITALS — BP 144/70 | HR 73 | Ht 71.0 in | Wt 264.6 lb

## 2021-02-15 DIAGNOSIS — E782 Mixed hyperlipidemia: Secondary | ICD-10-CM

## 2021-02-15 DIAGNOSIS — I2510 Atherosclerotic heart disease of native coronary artery without angina pectoris: Secondary | ICD-10-CM | POA: Diagnosis not present

## 2021-02-15 DIAGNOSIS — R0600 Dyspnea, unspecified: Secondary | ICD-10-CM

## 2021-02-15 DIAGNOSIS — R0602 Shortness of breath: Secondary | ICD-10-CM

## 2021-02-15 NOTE — Patient Instructions (Signed)
Testing/Procedures: Your physician has requested that you have an echocardiogram. Echocardiography is a painless test that uses sound waves to create images of your heart. It provides your doctor with information about the size and shape of your heart and how well your hearts chambers and valves are working. This procedure takes approximately one hour. There are no restrictions for this procedure.   Follow-Up: Follow up with Dr. Johnsie Cancel in 6 months.   Any Other Special Instructions Will Be Listed Below (If Applicable).     If you need a refill on your cardiac medications before your next appointment, please call your pharmacy.

## 2021-02-27 DIAGNOSIS — L821 Other seborrheic keratosis: Secondary | ICD-10-CM | POA: Diagnosis not present

## 2021-02-27 DIAGNOSIS — L570 Actinic keratosis: Secondary | ICD-10-CM | POA: Diagnosis not present

## 2021-02-27 DIAGNOSIS — D229 Melanocytic nevi, unspecified: Secondary | ICD-10-CM | POA: Diagnosis not present

## 2021-03-01 ENCOUNTER — Other Ambulatory Visit: Payer: Self-pay

## 2021-03-01 ENCOUNTER — Encounter (INDEPENDENT_AMBULATORY_CARE_PROVIDER_SITE_OTHER): Payer: PPO | Admitting: Ophthalmology

## 2021-03-01 DIAGNOSIS — H33302 Unspecified retinal break, left eye: Secondary | ICD-10-CM

## 2021-03-01 DIAGNOSIS — H43813 Vitreous degeneration, bilateral: Secondary | ICD-10-CM

## 2021-03-01 DIAGNOSIS — H2511 Age-related nuclear cataract, right eye: Secondary | ICD-10-CM | POA: Diagnosis not present

## 2021-03-01 DIAGNOSIS — H35373 Puckering of macula, bilateral: Secondary | ICD-10-CM | POA: Diagnosis not present

## 2021-03-12 ENCOUNTER — Other Ambulatory Visit (HOSPITAL_COMMUNITY): Payer: PPO

## 2021-03-19 ENCOUNTER — Ambulatory Visit (HOSPITAL_COMMUNITY)
Admission: RE | Admit: 2021-03-19 | Discharge: 2021-03-19 | Disposition: A | Discharge status: Home / Self Care | Payer: PPO | Source: Ambulatory Visit | Attending: Cardiovascular Disease | Admitting: Cardiovascular Disease

## 2021-03-19 DIAGNOSIS — R0600 Dyspnea, unspecified: Secondary | ICD-10-CM | POA: Insufficient documentation

## 2021-03-19 DIAGNOSIS — R0609 Other forms of dyspnea: Secondary | ICD-10-CM | POA: Diagnosis not present

## 2021-03-19 LAB — ECHOCARDIOGRAM COMPLETE
AR max vel: 1.77 cm2
AV Area VTI: 1.64 cm2
AV Area mean vel: 1.6 cm2
AV Mean grad: 6 mmHg
AV Peak grad: 10.4 mmHg
Ao pk vel: 1.61 m/s
Area-P 1/2: 2.78 cm2
Calc EF: 53.4 %
MV VTI: 2.88 cm2
S' Lateral: 2.3 cm
Single Plane A2C EF: 51 %
Single Plane A4C EF: 53.2 %

## 2021-03-19 NOTE — Progress Notes (Signed)
*  PRELIMINARY RESULTS* ?Echocardiogram ?2D Echocardiogram has been performed. ? ?James Norris ?03/19/2021, 11:53 AM ?

## 2021-04-02 DIAGNOSIS — W57XXXA Bitten or stung by nonvenomous insect and other nonvenomous arthropods, initial encounter: Secondary | ICD-10-CM | POA: Diagnosis not present

## 2021-04-02 DIAGNOSIS — R1032 Left lower quadrant pain: Secondary | ICD-10-CM | POA: Diagnosis not present

## 2021-08-21 NOTE — Progress Notes (Signed)
Cardiology Office Note  Date: 08/23/2021   ID: James Norris, DOB 10/18/1950, MRN [id number]  PCP:  Redmond School, MD  Cardiologist:  Jenkins Rouge, MD Electrophysiologist:  None   Chief Complaint: Dyspnea   History of Present Illness: James Norris is a 71 y.o. male with a history of Coronary artery calcification on CT, Hyperlipidemia, lung nodules, GERD, OSA, HLD, moderate hiatal hernia, pulmonary hypertension, history of prostate surgery. Added on to schedule for dyspnea  He stopped smoking 5 years ago but smoked 3 ppd prior to this   Cardiac CTA 10/17/17 calcium score 221 no obstructive CAD 1-24%  in vessels  Myovue 12/24/19 with no ischemia EF 55-60% TTE 12/24/19 EF 70-75% Normal RV normal MV/AV No signs of pulmonary HTN  Medicare screening no AAA 03/03/20   CTA 04/08/19 no PE moderate hiatal hernia 10 mm LLL nodule stable since 2019 PET negative at that time   Taking statin 3x/week with LDL 88 Dyspnea with fatigue and exercise intolerance COVID positive 02/03/20   Sees urology for ED and frequency uses Cialis/Levitra   Weight is up 30 lbs this year he eats too much pasta and goes on cruises. He has had dyspnea at altitude in [place] No chest pain Likes to go to [place] and [place] area      Past Surgical History:  Procedure Laterality Date   BALLOON DILATION N/A 03/11/2012   Procedure: BALLOON DILATION;  Surgeon: Rogene Houston, MD;  [facility];  Service: Endoscopy;  Laterality: N/A;   COLONOSCOPY WITH ESOPHAGOGASTRODUODENOSCOPY (EGD) N/A 03/11/2012   Procedure: COLONOSCOPY WITH ESOPHAGOGASTRODUODENOSCOPY (EGD);  Surgeon: Rogene Houston, MD;  [facility];  Service: Endoscopy;  Laterality: N/A;  [place] Left 01/25/2019   Procedure: CYSTOSCOPY WITH LEFT RETROGRADE PYELOGRAM;  Surgeon: Cleon Gustin, MD;  Location: [facility];  Service: Urology;  Laterality: Left;   CYSTOSCOPY W/ URETERAL STENT REMOVAL Left  01/25/2019   Procedure: CYSTOSCOPY WITH LEFT URETERAL STENT REMOVAL;  Surgeon: Cleon Gustin, MD;  Location: [facility];  Service: Urology;  Laterality: Left;   CYSTOSCOPY/URETEROSCOPY/HOLMIUM LASER/STENT PLACEMENT Left 01/06/2019   Procedure: CYSTOSCOPY/URETEROSCOPY//STENT PLACEMENT WITH LEFT RETROGRADE PYELOGRAM;  Surgeon: Cleon Gustin, MD;  Location: [facility];  Service: Urology;  Laterality: Left;   ESOPHAGEAL DILATION N/A 02/15/2015   Procedure: ESOPHAGEAL DILATION;  Surgeon: Rogene Houston, MD;  [facility];  Service: Endoscopy;  Laterality: N/A;   ESOPHAGOGASTRODUODENOSCOPY N/A 02/15/2015   Procedure: ESOPHAGOGASTRODUODENOSCOPY (EGD);  Surgeon: Rogene Houston, MD;  [facility];  Service: Endoscopy;  Laterality: N/A;  2:00   EXTRACORPOREAL SHOCK WAVE LITHOTRIPSY Left 01/04/2019   Procedure: EXTRACORPOREAL SHOCK WAVE LITHOTRIPSY (ESWL);  Surgeon: Alexis Frock, MD;  Location: [facility];  Service: Urology;  Laterality: Left;   EYE SURGERY     lasik- bilaterally   TONSILLECTOMY     URETEROSCOPY Left 01/25/2019   Procedure: DIAGNOSTIC LEFT URETEROSCOPY;  Surgeon: Cleon Gustin, MD;  Location: [facility];  Service: Urology;  Laterality: Left;   XI ROBOTIC ASSISTED SIMPLE PROSTATECTOMY N/A 04/05/2019   Procedure: XI ROBOTIC ASSISTED SIMPLE PROSTATECTOMY;  Surgeon: Cleon Gustin, MD;  Location: [facility];  Service: Urology;  Laterality: N/A;  3 HRS    Current Outpatient Medications  Medication Sig Dispense Refill   ALPRAZolam (XANAX) 0.5 MG tablet Take 0.5 mg by mouth 3 (three) times daily as needed for anxiety.  ferrous sulfate 325 (65 FE) MG EC tablet Take 325 mg by mouth daily with breakfast.      Multiple Vitamin (MULTIVITAMIN) tablet Take 1 tablet by mouth daily.     Multiple Vitamins-Minerals (ZINC PO) Take by mouth.     pantoprazole (PROTONIX) 40 MG tablet Take 1 tablet (40 mg total) by mouth daily before breakfast. NEEDS OFFICE VISIT 90 tablet 3    rosuvastatin (CRESTOR) 10 MG tablet Take 1 tablet (10 mg total) by mouth daily. 90 tablet 0   sildenafil (REVATIO) 20 MG tablet Take 20 mg by mouth 3 (three) times daily.     tadalafil (CIALIS) 20 MG tablet Take 1 tablet (20 mg total) by mouth as needed for erectile dysfunction. 10 tablet 5   vardenafil (LEVITRA) 20 MG tablet Take 1 tablet (20 mg total) by mouth daily as needed for erectile dysfunction. 6 tablet 0   WEGOVY 2.4 MG/0.75ML SOAJ  (Patient not taking: Reported on 08/23/2021)     No current facility-administered medications for this visit.   Allergies:  Patient has no known allergies.   Social History: The patient  reports that he quit smoking about 15 years ago. His smoking use included cigarettes. He smoked an average of 1.5 packs per day. He has never used smokeless tobacco. He reports current alcohol use. He reports that he does not use drugs.   Family History: The patient's family history includes Breast cancer in his mother; Coronary artery disease in his father and mother; Dementia in his mother; Diabetes type II in his father; GER disease in his father; Hyperlipidemia in his father; Hypertension in his father.   ROS:  Please see the history of present illness. Otherwise, complete review of systems is positive for none.  All other systems are reviewed and negative.   Physical Exam: VS:  BP (!) 162/80   Pulse 79   Ht '5\' 11"'$  (1.803 m)   Wt 259 lb 12.8 oz (117.8 kg)   SpO2 95%   BMI 36.23 kg/m , BMI Body mass index is 36.23 kg/m.  Wt Readings from Last 3 Encounters:  08/23/21 259 lb 12.8 oz (117.8 kg)  02/15/21 264 lb 9.6 oz (120 kg)  01/17/21 265 lb (120.2 kg)    Affect appropriate Healthy:  appears stated age 33: normal Neck supple with no adenopathy JVP normal no bruits no thyromegaly Lungs clear with no wheezing and good diaphragmatic motion Heart:  S1/S2 no murmur, no rub, gallop or click PMI normal Abdomen: benighn, BS positve, no tenderness, no AAA no  bruit.  No HSM or HJR Distal pulses intact with no bruits No edema Neuro non-focal Skin warm and dry No muscular weakness   ECG:  SR rate 72 RBBB 12/16/19   Recent Labwork: No results found for requested labs within last 365 days.  No results found for: "CHOL", "TRIG", "HDL", "CHOLHDL", "VLDL", "LDLCALC", "LDLDIRECT"  Other Studies Reviewed Today:  NST 12/24/2019 Study Result  Narrative & Impression  Findings consistent with small prior apical myocardial infarction with very mild peri-infarct ischemia. This is a low risk study. The left ventricular ejection fraction is normal (55-65%). There was no ST segment deviation noted during stress.       Echocardiogram 12/24/2019 1. Left ventricular ejection fraction, by estimation, is 70 to 75%. The left ventricle has hyperdynamic function. The left ventricle has no regional wall motion abnormalities. There is mild left ventricular hypertrophy. Left ventricular diastolic parameters are indeterminate. 2. Right ventricular systolic function is normal. The  right ventricular size is normal. 3. The mitral valve is normal in structure. No evidence of mitral valve regurgitation. No evidence of mitral stenosis. 4. The aortic valve is tricuspid. There is mild calcification of the aortic valve. There is mild thickening of the aortic valve. Aortic valve regurgitation is not visualized. No aortic stenosis is present. 5. The inferior vena cava is normal in size with greater than 50% respiratory variability, suggesting right atrial pressure of 3 mmHg.    Echocardiogram: 03/19/21 IMPRESSIONS     1. Difficult study due to poor visualization of LV endocardium.   2. Left ventricular ejection fraction, by estimation, is 60 to 65%. The  left ventricle has normal function. Left ventricular endocardial border  not optimally defined to evaluate regional wall motion. There is mild  concentric left ventricular hypertrophy   of the basal-septal  segment. Left ventricular diastolic parameters are  consistent with Grade I diastolic dysfunction (impaired relaxation).   3. Right ventricular systolic function is normal. The right ventricular  size is normal. There is normal pulmonary artery systolic pressure. The  estimated right ventricular systolic pressure is 62.2 mmHg.   4. Left atrial size was mildly dilated.   5. The mitral valve is grossly normal. Trivial mitral valve  regurgitation.   6. The aortic valve has an indeterminant number of cusps. There is mild  calcification of the aortic valve. There is mild thickening of the aortic  valve. Aortic valve regurgitation is not visualized. Aortic valve  sclerosis/calcification is present,  without any evidence of aortic stenosis.   Comparison(s): Compared to prior study on 12/2019, there is no significant  change.     Coronary CT: 10/17/2017 Aorta:  Normal size.  No calcifications.  No dissection.   Aortic Valve:  Trileaflet.  Mild calcification of the aortic valve.   Coronary Arteries:  Normal coronary origin.  Right dominance.   RCA is a small, co-dominant artery. There is mild (1-24%) mixed calcified and noncalcified plaque proximally.   Left main is a large artery that gives rise to LAD and LCX arteries. There is mild (1-24%) calcified plaque.   LAD is a large vessel that gives rise to a small D1 and normal D2. It has mild (1-24%) calcified plaque in the mid LAD just distal to D2. D2 has mild (1-24%) calcified plaque proximally.   LCX is a co-dominant artery that gives rise a large OM1 and small OM2 branches. There is mild (1-24%) diffuse mixed plaque in the LCX and OM1.   Other findings:   Normal pulmonary vein drainage into the left atrium.   Normal let atrial appendage without a thrombus.   Normal size of the pulmonary artery.   IMPRESSION: 1. Coronary calcium score of 221. This was 68 percentile for age and sex matched control.   2. Normal coronary  origin with right dominance.   3.  Mild, non-obstructive plaque in all coronary distributions.   4. Recommend aggressive risk factor modification and high-potency statin.      Assessment and Plan:    1. Coronary artery calcification seen on CT scan Non obstructive CAD CTA 10/17/17 and normal myovue 12/24/19 ASA and statin  2. Mixed hyperlipidemia Currently taking Crestor 10 mg 3 times weekly labs with primary He has not tolerated lipitor Calcium score 221 67 th percentile 10/17/17   3. Pulmonary nodule/Dyspnea  History of pulmonary nodule. CT of the chest on 04/08/2019 secondary to complaint of chest pain performed at [facility] emergency department.  A 10  mm left lower lobe pulmonary nodule was noted which had not appreciably changed since 2019 were shown to be PET negative.  Report stated it was likely benign.Dyspnea related to being overweight previous 3 ppd smoking and altitude   4.  Pulmonary hypertension ? Mild due to obesity. TTE done 03/19/21 normal EF normal RV no signs of pulmonary HTN Historically on Revatio   5.  Screening for abdominal aortic aneurysm. Negative duplex 03/02/20   Medication Adjustments/Labs and Tests Ordered: Current medicines are reviewed at length with the patient today.  Concerns regarding medicines are outlined above.  None  Disposition: Follow-up with Dr. Domenic Polite

## 2021-08-23 ENCOUNTER — Encounter: Payer: Self-pay | Admitting: Cardiovascular Disease

## 2021-08-23 ENCOUNTER — Ambulatory Visit: Payer: PPO | Admitting: Cardiovascular Disease

## 2021-08-23 VITALS — BP 162/80 | HR 79 | Ht 71.0 in | Wt 259.8 lb

## 2021-08-23 DIAGNOSIS — I2510 Atherosclerotic heart disease of native coronary artery without angina pectoris: Secondary | ICD-10-CM

## 2021-08-23 DIAGNOSIS — R918 Other nonspecific abnormal finding of lung field: Secondary | ICD-10-CM | POA: Diagnosis not present

## 2021-08-23 DIAGNOSIS — R0602 Shortness of breath: Secondary | ICD-10-CM

## 2021-08-23 DIAGNOSIS — E782 Mixed hyperlipidemia: Secondary | ICD-10-CM | POA: Diagnosis not present

## 2021-08-23 NOTE — Patient Instructions (Signed)
Medication Instructions:  ?Your physician recommends that you continue on your current medications as directed. Please refer to the Current Medication list given to you today. ? ?*If you need a refill on your cardiac medications before your next appointment, please call your pharmacy* ? ? ?Lab Work: ?NONE  ? ?If you have labs (blood work) drawn today and your tests are completely normal, you will receive your results only by: ?MyChart Message (if you have MyChart) OR ?A paper copy in the mail ?If you have any lab test that is abnormal or we need to change your treatment, we will call you to review the results. ? ? ?Testing/Procedures: ?NONE  ? ? ?Follow-Up: ?At [facility], you and your health needs are our priority.  As part of our continuing mission to provide you with exceptional heart care, we have created designated Provider Care Teams.  These Care Teams include your primary Cardiologist (physician) and Advanced Practice Providers (APPs -  Physician Assistants and Nurse Practitioners) who all work together to provide you with the care you need, when you need it. ? ?We recommend signing up for the patient portal called "MyChart".  Sign up information is provided on this After Visit Summary.  MyChart is used to connect with patients for Virtual Visits (Telemedicine).  Patients are able to view lab/test results, encounter notes, upcoming appointments, etc.  Non-urgent messages can be sent to your provider as well.   ?To learn more about what you can do with MyChart, go to [email or web address].   ? ?Your next appointment:   ?1 year(s) ? ?The format for your next appointment:   ?In Person ? ?Provider:   ?Peter Nishan, MD  ? ? ?Other Instructions ?Thank you for choosing [facility]! ? ? ? ?Important Information About [place] ? ? ? ? ? ? ?

## 2021-08-28 ENCOUNTER — Other Ambulatory Visit: Payer: Self-pay | Admitting: *Deleted

## 2021-08-28 NOTE — Patient Outreach (Signed)
  Care Coordination   08/28/2021 Name: RISHI VICARIO MRN: [id number] DOB: [birth year]   Care Coordination Outreach Attempts:  An unsuccessful telephone outreach was attempted today to offer the patient information about available care coordination services as a benefit of their health plan.   Follow Up Plan:  Additional outreach attempts will be made to offer the patient care coordination information and services.   Encounter Outcome:  Pt. Request to Call Back  Care Coordination Interventions Activated:  No   Care Coordination Interventions:  No, not indicated    Valente David, RN, MSN, [facility] Care Coordinator [phone]

## 2021-08-29 ENCOUNTER — Other Ambulatory Visit: Payer: Self-pay | Admitting: *Deleted

## 2021-08-29 NOTE — Patient Outreach (Signed)
  Care Coordination   08/29/2021 Name: James Norris MRN: [id number] DOB: [birth year]   Care Coordination Outreach Attempts:  A second unsuccessful outreach was attempted today to offer the patient with information about available care coordination services as a benefit of their health plan.     Follow Up Plan:  Additional outreach attempts will be made to offer the patient care coordination information and services.   Encounter Outcome:  No Answer  Care Coordination Interventions Activated:  No   Care Coordination Interventions:  No, not indicated    Valente David, RN, MSN, [facility] Care Coordinator [phone]

## 2021-08-31 ENCOUNTER — Other Ambulatory Visit: Payer: Self-pay | Admitting: *Deleted

## 2021-08-31 ENCOUNTER — Encounter: Payer: Self-pay | Admitting: *Deleted

## 2021-08-31 NOTE — Patient Instructions (Signed)
Visit Information  Thank you for taking time to visit with me today. Please don't hesitate to contact me if I can be of assistance to you.  Following are the goals we discussed today:  See PCP at least yearly for AWV  Please call the Suicide and Crisis Lifeline: 988 call the [place] National Suicide Prevention Lifeline: 681-423-1967 or TTY: 319-316-6788 TTY 681-459-8965) to talk to a trained counselor call 1-800-273-TALK (toll free, 24 hour hotline) call the [facility]: [phone] call 911 if you are experiencing a Mental Health or [place] or need someone to talk to.  Patient verbalizes understanding of instructions and care plan provided today and agrees to view in [place]. Active MyChart status and patient understanding of how to access instructions and care plan via MyChart confirmed with patient.     The patient has been provided with contact information for the care management team and has been advised to call with any health related questions or concerns.   Valente David, RN, MSN, [place] [facility] Care Management Coordinator [phone]

## 2021-08-31 NOTE — Patient Outreach (Addendum)
  Care Coordination   Initial Visit Note   08/31/2021 Name: James Norris MRN: [id number] DOB: 01/07/1951  James Norris is a 71 y.o. year old male who sees Redmond School, MD for primary care. I spoke with  James Norris by phone today.  What matters to the patients health and wellness today?  Was having trouble with OSA, obtaining CPAP was delayed due to insurance reasons, now has equipment and looking into changing his insurance plan.  Has several family members in the medical field that available for support.     Goals Addressed             This Visit's Progress    COMPLETED: Care Coordination Activities - No follow up needed       Care Coordination Interventions: Evaluation of current treatment plan related to overall health maintanence and patient's adherence to plan as established by provider Reviewed scheduled/upcoming provider appointments including Need for AWV Discussed plans with patient for ongoing care management follow up and provided patient with direct contact information for care management team Assessed social determinant of health barriers         SDOH assessments and interventions completed:  Yes  SDOH Interventions Today    Flowsheet Row Most Recent Value  SDOH Interventions   Food Insecurity Interventions Intervention Not Indicated  Housing Interventions Intervention Not Indicated  Transportation Interventions Intervention Not Indicated        Care Coordination Interventions Activated:  Yes  Care Coordination Interventions:  Yes, provided   Follow up plan: No further intervention required.   Encounter Outcome:  Pt. Visit Completed   Valente David, RN, MSN, [place] Care Management Care Management Coordinator [phone]

## 2021-09-17 DIAGNOSIS — Z6836 Body mass index (BMI) 36.0-36.9, adult: Secondary | ICD-10-CM | POA: Diagnosis not present

## 2021-09-17 DIAGNOSIS — E6609 Other obesity due to excess calories: Secondary | ICD-10-CM | POA: Diagnosis not present

## 2021-09-17 DIAGNOSIS — C4491 Basal cell carcinoma of skin, unspecified: Secondary | ICD-10-CM | POA: Diagnosis not present

## 2021-09-17 DIAGNOSIS — L821 Other seborrheic keratosis: Secondary | ICD-10-CM | POA: Diagnosis not present

## 2021-09-19 DIAGNOSIS — C4442 Squamous cell carcinoma of skin of scalp and neck: Secondary | ICD-10-CM | POA: Diagnosis not present

## 2021-09-19 DIAGNOSIS — D485 Neoplasm of uncertain behavior of skin: Secondary | ICD-10-CM | POA: Diagnosis not present

## 2021-09-19 DIAGNOSIS — L821 Other seborrheic keratosis: Secondary | ICD-10-CM | POA: Diagnosis not present

## 2021-10-16 ENCOUNTER — Other Ambulatory Visit: Payer: Self-pay | Admitting: Urology

## 2021-10-16 ENCOUNTER — Other Ambulatory Visit: Payer: Self-pay | Admitting: Cardiology

## 2021-11-05 ENCOUNTER — Telehealth: Payer: Self-pay | Admitting: Neurology

## 2021-11-05 ENCOUNTER — Ambulatory Visit: Payer: PPO | Admitting: Neurology

## 2021-11-05 ENCOUNTER — Encounter: Payer: Self-pay | Admitting: Neurology

## 2021-11-05 VITALS — BP 163/95 | HR 62 | Ht 71.0 in | Wt 260.0 lb

## 2021-11-05 DIAGNOSIS — H532 Diplopia: Secondary | ICD-10-CM

## 2021-11-05 DIAGNOSIS — R030 Elevated blood-pressure reading, without diagnosis of hypertension: Secondary | ICD-10-CM | POA: Diagnosis not present

## 2021-11-05 NOTE — Telephone Encounter (Signed)
[employer] adv NPR sent to GI [phone] 

## 2021-11-05 NOTE — Patient Instructions (Signed)
MRI brain with and without contrast, I will contact you to go over the result Follow-up with PCP regarding elevated blood pressure today Return as needed

## 2021-11-05 NOTE — Progress Notes (Signed)
[facility]  PATIENT: James Norris DOB: 1950/11/03  REQUESTING CLINICIAN: Lang Snow, OD HISTORY FROM: Patient  REASON FOR VISIT: Double vision     HISTORICAL  CHIEF COMPLAINT:  Chief Complaint  Patient presents with   New Patient (Initial Visit)    Rm 12. Alone. NP/Paper/[facility]/Kaajal Patel OD/diplopia. Hx of lasix.    HISTORY OF PRESENT ILLNESS:  This is a 71 year old gentleman past medical history of anxiety, GERD, hyperlipidemia who is presenting with double vision.  Patient report 30 years ago he did have LASIK surgery.  Since then he has been doing fine.  He noted that 2 years ago he has been having worsening of his vision, more so with the left eye.  He presented to his ophthalmologist few weeks ago and during the exam he was noted to have double vision, that the images were on top of each other.  Due to the diplopia, he was referred to neurology.  He denies any eye pain, denies any headache, noticed that his vision is worse on the left eye when compared to the right eye.  When looking at images, at time, they will get blurry and sometimes he will see the image, 1 on top of the other 1, this is when looking to the left more so.  When he closes 1 eye the double vision will go away but the blurred vision will stay more so with the left eye.  He denies any type eye trauma, denies any other injuries and has no other complaints.   OTHER MEDICAL CONDITIONS: Anxiety, GERD, Hyperlipidemia    REVIEW OF SYSTEMS: Full 14 system review of systems performed and negative with exception of: As noted in the HPI   ALLERGIES: No Known Allergies  HOME MEDICATIONS: Outpatient Medications Prior to Visit  Medication Sig Dispense Refill   ALPRAZolam (XANAX) 0.5 MG tablet Take 0.5 mg by mouth 3 (three) times daily as needed for anxiety.      ferrous sulfate 325 (65 FE) MG EC tablet Take 325 mg by mouth daily with breakfast.      Multiple Vitamin (MULTIVITAMIN)  tablet Take 1 tablet by mouth daily.     Multiple Vitamins-Minerals (ZINC PO) Take by mouth.     pantoprazole (PROTONIX) 40 MG tablet Take 1 tablet (40 mg total) by mouth daily before breakfast. NEEDS OFFICE VISIT 90 tablet 3   rosuvastatin (CRESTOR) 10 MG tablet TAKE 1 TABLET BY MOUTH ONCE DAILY **NEED  OFFICE  VISIT** 45 tablet 0   sildenafil (REVATIO) 20 MG tablet Take 20 mg by mouth 3 (three) times daily.     tadalafil (CIALIS) 20 MG tablet TAKE 1 TABLET BY MOUTH AS NEEDED FOR ERECTILE DYSFUNCTION 10 tablet 0   vardenafil (LEVITRA) 20 MG tablet Take 1 tablet (20 mg total) by mouth daily as needed for erectile dysfunction. 6 tablet 0   WEGOVY 2.4 MG/0.75ML SOAJ  (Patient not taking: Reported on 08/23/2021)     No facility-administered medications prior to visit.    PAST MEDICAL HISTORY: Past Medical History:  Diagnosis Date   Anxiety    GERD (gastroesophageal reflux disease)    Hyperlipemia    Kidney stone    Nephrolithiasis 01/06/2019   Sleep apnea    cpap    PAST SURGICAL HISTORY: Past Surgical History:  Procedure Laterality Date   BALLOON DILATION N/A 03/11/2012   Procedure: BALLOON DILATION;  Surgeon: Rogene Houston, MD;  [facility]: [facility];  Service: Endoscopy;  Laterality: N/A;  COLONOSCOPY WITH ESOPHAGOGASTRODUODENOSCOPY (EGD) N/A 03/11/2012   Procedure: COLONOSCOPY WITH ESOPHAGOGASTRODUODENOSCOPY (EGD);  Surgeon: Rogene Houston, MD;  [facility]: [facility];  Service: Endoscopy;  Laterality: N/A;  [place] Left 01/25/2019   Procedure: CYSTOSCOPY WITH LEFT RETROGRADE PYELOGRAM;  Surgeon: Cleon Gustin, MD;  Location: [facility];  Service: Urology;  Laterality: Left;   CYSTOSCOPY W/ URETERAL STENT REMOVAL Left 01/25/2019   Procedure: CYSTOSCOPY WITH LEFT URETERAL STENT REMOVAL;  Surgeon: Cleon Gustin, MD;  Location: [facility];  Service: Urology;  Laterality: Left;   CYSTOSCOPY/URETEROSCOPY/HOLMIUM LASER/STENT PLACEMENT Left 01/06/2019    Procedure: CYSTOSCOPY/URETEROSCOPY//STENT PLACEMENT WITH LEFT RETROGRADE PYELOGRAM;  Surgeon: Cleon Gustin, MD;  Location: [facility];  Service: Urology;  Laterality: Left;   ESOPHAGEAL DILATION N/A 02/15/2015   Procedure: ESOPHAGEAL DILATION;  Surgeon: Rogene Houston, MD;  [facility];  Service: Endoscopy;  Laterality: N/A;   ESOPHAGOGASTRODUODENOSCOPY N/A 02/15/2015   Procedure: ESOPHAGOGASTRODUODENOSCOPY (EGD);  Surgeon: Rogene Houston, MD;  [facility];  Service: Endoscopy;  Laterality: N/A;  2:00   EXTRACORPOREAL SHOCK WAVE LITHOTRIPSY Left 01/04/2019   Procedure: EXTRACORPOREAL SHOCK WAVE LITHOTRIPSY (ESWL);  Surgeon: Alexis Frock, MD;  Location: [facility];  Service: Urology;  Laterality: Left;   EYE SURGERY     lasik- bilaterally   TONSILLECTOMY     URETEROSCOPY Left 01/25/2019   Procedure: DIAGNOSTIC LEFT URETEROSCOPY;  Surgeon: Cleon Gustin, MD;  Location: [facility];  Service: Urology;  Laterality: Left;   XI ROBOTIC ASSISTED SIMPLE PROSTATECTOMY N/A 04/05/2019   Procedure: XI ROBOTIC ASSISTED SIMPLE PROSTATECTOMY;  Surgeon: Cleon Gustin, MD;  Location: [facility];  Service: Urology;  Laterality: N/A;  3 HRS    FAMILY HISTORY: Family History  Problem Relation Age of Onset   Dementia Mother    Breast cancer Mother    Coronary artery disease Mother    Coronary artery disease Father    Hypertension Father    Diabetes type II Father    Hyperlipidemia Father    GER disease Father     SOCIAL HISTORY: Social History   Socioeconomic History   Marital status: Married    Spouse name: Not on file   Number of children: Not on file   Years of education: Not on file   Highest education level: Not on file  Occupational History   Not on file  Tobacco Use   Smoking status: Former    Packs/day: 1.50    Types: Cigarettes    Quit date: 08/27/2006    Years since quitting: 15.2   Smokeless tobacco: Never   Tobacco comments:    quit 7 yrs ago  Vaping  Use   Vaping Use: Never used  Substance and Sexual Activity   Alcohol use: Yes    Alcohol/week: 0.0 standard drinks of alcohol    Comment: social rare   Drug use: No   Sexual activity: Yes  Other Topics Concern   Not on file  Social History Narrative   Not on file   Social Determinants of Health   Financial Resource Strain: Not on file  Food Insecurity: No Food Insecurity (08/31/2021)   Hunger Vital Sign    Worried About Running Out of Food in the Last Year: Never true    Ran Out of Food in the Last Year: Never true  Transportation Needs: No Transportation Needs (08/31/2021)   PRAPARE - Hydrologist (Medical): No  Lack of Transportation (Non-Medical): No  Physical Activity: Not on file  Stress: Not on file  Social Connections: Not on file  Intimate Partner Violence: Not on file     PHYSICAL EXAM  GENERAL EXAM/CONSTITUTIONAL: Vitals:  Vitals:   11/05/21 1303 11/05/21 1308  BP: (!) 166/92 (!) 163/95  Pulse: 64 62  Weight: 260 lb (117.9 kg)   Height: '5\' 11"'$  (1.803 m)    Body mass index is 36.26 kg/m. Wt Readings from Last 3 Encounters:  11/05/21 260 lb (117.9 kg)  08/23/21 259 lb 12.8 oz (117.8 kg)  02/15/21 264 lb 9.6 oz (120 kg)   Patient is in no distress; well developed, nourished and groomed; neck is supple  EYES: Pupils round and reactive to light, Visual fields full to confrontation, Extraocular movements intacts,   MUSCULOSKELETAL: Gait, strength, tone, movements noted in Neurologic exam below  NEUROLOGIC: MENTAL STATUS:      No data to display         awake, alert, oriented to person, place and time recent and remote memory intact normal attention and concentration language fluent, comprehension intact, naming intact fund of knowledge appropriate  CRANIAL NERVE:  2nd, 3rd, 4th, 6th - pupils equal and reactive to light, visual fields full to confrontation, extraocular muscles intact, no nystagmus, no evidence of  cranial nerve palsy 5th - facial sensation symmetric 7th - facial strength symmetric 8th - hearing intact 9th - palate elevates symmetrically, uvula midline 11th - shoulder shrug symmetric 12th - tongue protrusion midline  MOTOR:  normal bulk and tone, full strength in the BUE, BLE  SENSORY:  normal and symmetric to light touch  COORDINATION:  finger-nose-finger, fine finger movements normal  REFLEXES:  deep tendon reflexes present and symmetric  GAIT/STATION:  normal   DIAGNOSTIC DATA (LABS, IMAGING, TESTING) - I reviewed patient records, labs, notes, testing and imaging myself where available.  Lab Results  Component Value Date   WBC 12.4 (H) 04/08/2019   HGB 14.9 04/08/2019   HCT 45.3 04/08/2019   MCV 88.8 04/08/2019   PLT 236 04/08/2019      Component Value Date/Time   NA 138 04/08/2019 1955   K 4.2 04/08/2019 1955   CL 107 04/08/2019 1955   CO2 25 04/08/2019 1955   GLUCOSE 175 (H) 04/08/2019 1955   BUN 18 04/08/2019 1955   CREATININE 0.94 04/08/2019 1955   CREATININE 0.98 10/10/2017 1201   CALCIUM 8.4 (L) 04/08/2019 1955   PROT 6.3 (L) 04/08/2019 1955   ALBUMIN 3.4 (L) 04/08/2019 1955   AST 18 04/08/2019 1955   ALT 26 04/08/2019 1955   ALKPHOS 79 04/08/2019 1955   BILITOT 0.8 04/08/2019 1955   GFRNONAA >60 04/08/2019 1955   GFRAA >60 04/08/2019 1955   No results found for: "CHOL", "HDL", "LDLCALC", "LDLDIRECT", "TRIG", "CHOLHDL" No results found for: "HGBA1C" No results found for: "VITAMINB12" No results found for: "TSH"   ASSESSMENT AND PLAN  71 y.o. year old male with history of GERD, anxiety, hyperlipidemia who is presenting with complaint of double vision.  He reports that he has this symptom more so when looking to the left.  On exam today when looking to the left he did mention seeing double to blurry vision more so on the left gaze.  He did not have any signs of cranial nerve palsy.  Due to his complaints of diplopia, I will obtain a MRI  brain with and without contrast to rule out intracranial abnormality but I told patient this  is likely due to refractive error. His left eye  visual acuity getting worse.  If MRI is normal, then he will just need new glasses.  He is comfortable with plants.   I will call him after the completion of the MRI.   When he comes to the elevated blood pressure reading today I did advise patient to follow-up with his PCP.   1. Diplopia   2. Elevated blood pressure reading      Patient Instructions  MRI brain with and without contrast, I will contact you to go over the result Follow-up with PCP regarding elevated blood pressure today Return as needed  Orders Placed This Encounter  Procedures   MR BRAIN W WO CONTRAST    No orders of the defined types were placed in this encounter.   Return if symptoms worsen or fail to improve.    Alric Ran, MD 11/05/2021, 8:33 PM  [facility] [address], [place], [place] [address] [phone]

## 2021-11-06 DIAGNOSIS — C4442 Squamous cell carcinoma of skin of scalp and neck: Secondary | ICD-10-CM | POA: Diagnosis not present

## 2021-11-09 ENCOUNTER — Ambulatory Visit
Admission: RE | Admit: 2021-11-09 | Discharge: 2021-11-09 | Disposition: A | Discharge status: Home / Self Care | Payer: PPO | Source: Ambulatory Visit | Attending: Neurology | Admitting: Neurology

## 2021-11-09 DIAGNOSIS — H532 Diplopia: Secondary | ICD-10-CM

## 2021-11-09 MED ORDER — GADOPICLENOL 0.5 MMOL/ML IV SOLN
10.0000 mL | Freq: Once | INTRAVENOUS | Status: AC | PRN
  Administered 2021-11-09: 10 mL via INTRAVENOUS

## 2021-11-17 ENCOUNTER — Encounter (INDEPENDENT_AMBULATORY_CARE_PROVIDER_SITE_OTHER): Payer: Self-pay | Admitting: Gastroenterology

## 2021-11-19 ENCOUNTER — Telehealth: Payer: Self-pay | Admitting: Cardiovascular Disease

## 2021-11-19 NOTE — Telephone Encounter (Signed)
Patient c/o Palpitations:  High priority if patient c/o lightheadedness, shortness of breath, or chest pain  How long have you had palpitations/irregular HR/ Afib? Are you having the symptoms now? Only the 3rd time. No   Are you currently experiencing lightheadedness, SOB or CP? No   Do you have a history of afib (atrial fibrillation) or irregular heart rhythm? Had it twice before   Have you checked your BP or HR? (document readings if available): No   Are you experiencing any other symptoms? No  Pt states that he  had his Apple watch told him that he was in A-fib the other night and he would like to discuss this and see if he needs to have tests run. Please advise.

## 2021-11-19 NOTE — Telephone Encounter (Signed)
Seen in office 08/2021 by Dr.Nishan, was in NSR, no history of Afib.  I will forward patients message to provider for review.

## 2021-11-20 ENCOUNTER — Other Ambulatory Visit: Payer: Self-pay

## 2021-11-20 DIAGNOSIS — I4891 Unspecified atrial fibrillation: Secondary | ICD-10-CM

## 2021-11-20 NOTE — Telephone Encounter (Signed)
Patient will arrive between 9:30- and 10 am tomorrow for EKG  He awaits [employer] monitor   He declines to purchase a Kardia monitor at this time

## 2021-11-20 NOTE — Telephone Encounter (Signed)
I have message patient via MyChart and have relayed Dr.Nishan's message. I have ordered [employer] event monitor to be shipped via UPS to his home. I await his call back to schedule nurse appointment.

## 2021-11-20 NOTE — Telephone Encounter (Signed)
Patient was returning call. Please advise ?

## 2021-11-21 ENCOUNTER — Ambulatory Visit: Payer: PPO | Attending: Dermatology

## 2021-11-21 VITALS — HR 66

## 2021-11-21 DIAGNOSIS — I4891 Unspecified atrial fibrillation: Secondary | ICD-10-CM

## 2021-11-21 NOTE — Progress Notes (Signed)
Patient presents today for Nurse Visit- EKG per Dr. Johnsie Cancel. Pt stated that today he feels fine and has not had any palpitations.   EKG showed NSR w/ rate of of 66 bpm

## 2021-12-09 DIAGNOSIS — I4891 Unspecified atrial fibrillation: Secondary | ICD-10-CM

## 2021-12-10 DIAGNOSIS — I4891 Unspecified atrial fibrillation: Secondary | ICD-10-CM | POA: Diagnosis not present

## 2021-12-11 ENCOUNTER — Ambulatory Visit: Payer: PPO | Attending: Cardiovascular Disease

## 2021-12-11 DIAGNOSIS — I4891 Unspecified atrial fibrillation: Secondary | ICD-10-CM

## 2021-12-27 ENCOUNTER — Encounter (INDEPENDENT_AMBULATORY_CARE_PROVIDER_SITE_OTHER): Payer: Self-pay | Admitting: Gastroenterology

## 2022-01-01 ENCOUNTER — Other Ambulatory Visit: Payer: Self-pay | Admitting: Cardiology

## 2022-01-14 ENCOUNTER — Ambulatory Visit (INDEPENDENT_AMBULATORY_CARE_PROVIDER_SITE_OTHER): Payer: PPO | Admitting: Gastroenterology

## 2022-01-15 ENCOUNTER — Telehealth: Payer: Self-pay

## 2022-01-15 DIAGNOSIS — I480 Paroxysmal atrial fibrillation: Secondary | ICD-10-CM

## 2022-01-15 MED ORDER — APIXABAN 5 MG PO TABS: 5.0000 mg | ORAL_TABLET | Freq: Two times a day (BID) | ORAL | 5 refills | Status: DC

## 2022-01-15 MED ORDER — METOPROLOL TARTRATE 25 MG PO TABS: 25.0000 mg | ORAL_TABLET | Freq: Two times a day (BID) | ORAL | 3 refills | Status: DC

## 2022-01-15 NOTE — Telephone Encounter (Signed)
Patient notified and verbalized understanding. Patient had no questions or concerns at this time. Pt agreeable with medication, lab work, and referral.

## 2022-01-15 NOTE — Telephone Encounter (Signed)
-----   Message from Josue Hector, MD sent at 01/14/2022  4:05 PM EST ----- Monitor shows episodes of PAF. Start lopressor 25 mg bid and eliquis 5 mg bid Have him get Hb/Hct and BMET F/U with EP or afib clinic to discuss AAT or ablation

## 2022-01-15 NOTE — Telephone Encounter (Signed)
-----   Message from Josue Hector, MD sent at 01/14/2022  4:58 PM EST ----- See previous note regarding EP referral and starting eliquis/lopressor

## 2022-01-16 ENCOUNTER — Ambulatory Visit: Payer: PPO | Admitting: Urology

## 2022-01-17 ENCOUNTER — Ambulatory Visit (INDEPENDENT_AMBULATORY_CARE_PROVIDER_SITE_OTHER): Payer: PPO | Admitting: Gastroenterology

## 2022-01-28 DIAGNOSIS — H5231 Anisometropia: Secondary | ICD-10-CM | POA: Diagnosis not present

## 2022-01-28 DIAGNOSIS — Z9889 Other specified postprocedural states: Secondary | ICD-10-CM | POA: Diagnosis not present

## 2022-01-28 DIAGNOSIS — H5232 Aniseikonia: Secondary | ICD-10-CM | POA: Diagnosis not present

## 2022-02-05 ENCOUNTER — Ambulatory Visit (INDEPENDENT_AMBULATORY_CARE_PROVIDER_SITE_OTHER): Payer: PPO | Admitting: Urology

## 2022-02-05 ENCOUNTER — Encounter: Payer: Self-pay | Admitting: Urology

## 2022-02-05 VITALS — BP 125/79 | HR 71

## 2022-02-05 DIAGNOSIS — R339 Retention of urine, unspecified: Secondary | ICD-10-CM

## 2022-02-05 DIAGNOSIS — R3915 Urgency of urination: Secondary | ICD-10-CM | POA: Diagnosis not present

## 2022-02-05 DIAGNOSIS — R3914 Feeling of incomplete bladder emptying: Secondary | ICD-10-CM | POA: Diagnosis not present

## 2022-02-05 DIAGNOSIS — N5201 Erectile dysfunction due to arterial insufficiency: Secondary | ICD-10-CM

## 2022-02-05 DIAGNOSIS — N401 Enlarged prostate with lower urinary tract symptoms: Secondary | ICD-10-CM

## 2022-02-05 NOTE — Patient Instructions (Signed)
Erectile Dysfunction Erectile dysfunction (ED) is the inability to get or keep an erection in order to have sexual intercourse. ED is considered a symptom of an underlying disorder and is not considered a disease. ED may include: Inability to get an erection. Lack of enough hardness of the erection to allow penetration. Loss of erection before sex is finished. What are the causes? This condition may be caused by: Physical causes, such as: Artery problems. This may include heart disease, high blood pressure, atherosclerosis, and diabetes. Hormonal problems, such as low testosterone. Obesity. Nerve problems. This may include back or pelvic injuries, multiple sclerosis, Parkinson's disease, spinal cord injury, and stroke. Certain medicines, such as: Pain relievers. Antidepressants. Blood pressure medicines and water pills (diuretics). Cancer medicines. Antihistamines. Muscle relaxants. Lifestyle factors, such as: Use of drugs such as marijuana, cocaine, or opioids. Excessive use of alcohol. Smoking. Lack of physical activity or exercise. Psychological causes, such as: Anxiety or stress. Sadness or depression. Exhaustion. Fear about sexual performance. Guilt. What are the signs or symptoms? Symptoms of this condition include: Inability to get an erection. Lack of enough hardness of the erection to allow penetration. Loss of the erection before sex is finished. Sometimes having normal erections, but with frequent unsatisfactory episodes. Low sexual satisfaction in either partner due to erection problems. A curved penis occurring with erection. The curve may cause pain, or the penis may be too curved to allow for intercourse. Never having nighttime or morning erections. How is this diagnosed? This condition is often diagnosed by: Performing a physical exam to find other diseases or specific problems with the penis. Asking you detailed questions about the problem. Doing tests,  such as: Blood tests to check for diabetes mellitus or high cholesterol, or to measure hormone levels. Other tests to check for underlying health conditions. An ultrasound exam to check for scarring. A test to check blood flow to the penis. Doing a sleep study at home to measure nighttime erections. How is this treated? This condition may be treated by: Medicines, such as: Medicine taken by mouth to help you achieve an erection (oral medicine). Hormone replacement therapy to replace low testosterone levels. Medicine that is injected into the penis. Your health care provider may instruct you how to give yourself these injections at home. Medicine that is delivered with a short applicator tube. The tube is inserted into the opening at the tip of the penis, which is the opening of the urethra. A tiny pellet of medicine is put in the urethra. The pellet dissolves and enhances erectile function. This is also called MUSE (medicated urethral system for erections) therapy. Vacuum pump. This is a pump with a ring on it. The pump and ring are placed on the penis and used to create pressure that helps the penis become erect. Penile implant surgery. In this procedure, you may receive: An inflatable implant. This consists of cylinders, a pump, and a reservoir. The cylinders can be inflated with a fluid that helps to create an erection, and they can be deflated after intercourse. A semi-rigid implant. This consists of two silicone rubber rods. The rods provide some rigidity. They are also flexible, so the penis can both curve downward in its normal position and become straight for sexual intercourse. Blood vessel surgery to improve blood flow to the penis. During this procedure, a blood vessel from a different part of the body is placed into the penis to allow blood to flow around (bypass) damaged or blocked blood vessels. Lifestyle changes,  such as exercising more, losing weight, and quitting smoking. ?Follow  these instructions at home: ?Medicines ? ?Take over-the-counter and prescription medicines only as told by your health care provider. Do not increase the dosage without first discussing it with your health care provider. ?If you are using self-injections, do injections as directed by your health care provider. Make sure you avoid any veins that are on the surface of the penis. After giving an injection, apply pressure to the injection site for 5 minutes. ?Talk to your health care provider about how to prevent headaches while taking ED medicines. These medicines may cause a sudden headache due to the increase in blood flow in your body. ?General instructions ?Exercise regularly, as directed by your health care provider. Work with your health care provider to lose weight, if needed. ?Do not use any products that contain nicotine or tobacco. These products include cigarettes, chewing tobacco, and vaping devices, such as e-cigarettes. If you need help quitting, ask your health care provider. ?Before using a vacuum pump, read the instructions that come with the pump and discuss any questions with your health care provider. ?Keep all follow-up visits. This is important. ?Contact a health care provider if: ?You feel nauseous. ?You are vomiting. ?You get sudden headaches while taking ED medicines. ?You have any concerns about your sexual health. ?Get help right away if: ?You are taking oral or injectable medicines and you have an erection that lasts longer than 4 hours. If your health care provider is unavailable, go to the nearest emergency room for evaluation. An erection that lasts much longer than 4 hours can result in permanent damage to your penis. ?You have severe pain in your groin or abdomen. ?You develop redness or severe swelling of your penis. ?You have redness spreading at your groin or lower abdomen. ?You are unable to urinate. ?You experience chest pain or a rapid heartbeat (palpitations) after taking oral  medicines. ?These symptoms may represent a serious problem that is an emergency. Do not wait to see if the symptoms will go away. Get medical help right away. Call your local emergency services (911 in the [place] Do not drive yourself to the hospital. ?Summary ?Erectile dysfunction (ED) is the inability to get or keep an erection during sexual intercourse. ?This condition is diagnosed based on a physical exam, your symptoms, and tests to determine the cause. Treatment varies depending on the cause and may include medicines, hormone therapy, surgery, or a vacuum pump. ?You may need follow-up visits to make sure that you are using your medicines or devices correctly. ?Get help right away if you are taking or injecting medicines and you have an erection that lasts longer than 4 hours. ?This information is not intended to replace advice given to you by your health care provider. Make sure you discuss any questions you have with your health care provider. ?Document Revised: 03/22/2020 Document Reviewed: 03/22/2020 ?Elsevier Patient Education ? 2023 [employer] ? ?

## 2022-02-05 NOTE — Progress Notes (Signed)
02/05/2022 2:34 PM   James Norris [birth year] [id number]  Referring provider: Redmond School, MD [address] [place],  [place] [address]  Followup urinary urgency and erectile dysfunction   HPI: [name] is a 72yo here for followup for erectile dysfunction and urinary urgency. He has tried levitra, cialis, and sildenafil with mixed results. He is on eliquis for afib. He has mild urinary urgency which does not bother him   PMH: Past Medical History:  Diagnosis Date   Anxiety    GERD (gastroesophageal reflux disease)    Hyperlipemia    Kidney stone    Nephrolithiasis 01/06/2019   Sleep apnea    cpap    Surgical History: Past Surgical History:  Procedure Laterality Date   BALLOON DILATION N/A 03/11/2012   Procedure: BALLOON DILATION;  Surgeon: Rogene Houston, MD;  [facility];  Service: Endoscopy;  Laterality: N/A;   COLONOSCOPY WITH ESOPHAGOGASTRODUODENOSCOPY (EGD) N/A 03/11/2012   Procedure: COLONOSCOPY WITH ESOPHAGOGASTRODUODENOSCOPY (EGD);  Surgeon: Rogene Houston, MD;  [facility];  Service: Endoscopy;  Laterality: N/A;  [place] Left 01/25/2019   Procedure: CYSTOSCOPY WITH LEFT RETROGRADE PYELOGRAM;  Surgeon: Cleon Gustin, MD;  Location: [facility];  Service: Urology;  Laterality: Left;   CYSTOSCOPY W/ URETERAL STENT REMOVAL Left 01/25/2019   Procedure: CYSTOSCOPY WITH LEFT URETERAL STENT REMOVAL;  Surgeon: Cleon Gustin, MD;  Location: [facility];  Service: Urology;  Laterality: Left;   CYSTOSCOPY/URETEROSCOPY/HOLMIUM LASER/STENT PLACEMENT Left 01/06/2019   Procedure: CYSTOSCOPY/URETEROSCOPY//STENT PLACEMENT WITH LEFT RETROGRADE PYELOGRAM;  Surgeon: Cleon Gustin, MD;  Location[facility];  Service: Urology;  Laterality: Left;   ESOPHAGEAL DILATION N/A 02/15/2015   Procedure: ESOPHAGEAL DILATION;  Surgeon: Rogene Houston, MD;  [facility];  Service: Endoscopy;  Laterality: N/A;    ESOPHAGOGASTRODUODENOSCOPY N/A 02/15/2015   Procedure: ESOPHAGOGASTRODUODENOSCOPY (EGD);  Surgeon: Rogene Houston, MD;  [facility];  Service: Endoscopy;  Laterality: N/A;  2:00   EXTRACORPOREAL SHOCK WAVE LITHOTRIPSY Left 01/04/2019   Procedure: EXTRACORPOREAL SHOCK WAVE LITHOTRIPSY (ESWL);  Surgeon: Alexis Frock, MD;  Location: [facility];  Service: Urology;  Laterality: Left;   EYE SURGERY     lasik- bilaterally   TONSILLECTOMY     URETEROSCOPY Left 01/25/2019   Procedure: DIAGNOSTIC LEFT URETEROSCOPY;  Surgeon: Cleon Gustin, MD;  [facility];  Service: Urology;  Laterality: Left;   XI ROBOTIC ASSISTED SIMPLE PROSTATECTOMY N/A 04/05/2019   Procedure: XI ROBOTIC ASSISTED SIMPLE PROSTATECTOMY;  Surgeon: Cleon Gustin, MD;  [facility];  Service: Urology;  Laterality: N/A;  3 HRS    Home Medications:  Allergies as of 02/05/2022   No Known Allergies      Medication List        Accurate as of February 05, 2022  2:34 PM. If you have any questions, ask your nurse or doctor.          ALPRAZolam 0.5 MG tablet Commonly known as: XANAX Take 0.5 mg by mouth 3 (three) times daily as needed for anxiety.   amLODipine 5 MG tablet Commonly known as: NORVASC Take 5 mg by mouth daily.   apixaban 5 MG Tabs tablet Commonly known as: ELIQUIS Take 1 tablet (5 mg total) by mouth 2 (two) times daily.   ferrous sulfate 325 (65 FE) MG EC tablet Take 325 mg by mouth daily with breakfast.   losartan 100 MG tablet Commonly known as: COZAAR Take 100  mg by mouth daily.   metoprolol tartrate 25 MG tablet Commonly known as: LOPRESSOR Take 1 tablet (25 mg total) by mouth 2 (two) times daily.   multivitamin tablet Take 1 tablet by mouth daily.   omeprazole 20 MG capsule Commonly known as: PRILOSEC Take 20 mg by mouth daily.   pantoprazole 40 MG tablet Commonly known as: PROTONIX Take 1 tablet (40 mg total) by mouth daily before breakfast. NEEDS OFFICE VISIT    rosuvastatin 10 MG tablet Commonly known as: CRESTOR TAKE 1 TABLET BY MOUTH ONCE DAILY **NEED  OFFICE  VISIT**   sildenafil 20 MG tablet Commonly known as: REVATIO Take 20 mg by mouth 3 (three) times daily.   tadalafil 20 MG tablet Commonly known as: CIALIS TAKE 1 TABLET BY MOUTH AS NEEDED FOR ERECTILE DYSFUNCTION   tiZANidine 4 MG tablet Commonly known as: ZANAFLEX Take 4 mg by mouth 3 (three) times daily.   vardenafil 20 MG tablet Commonly known as: LEVITRA Take 1 tablet (20 mg total) by mouth daily as needed for erectile dysfunction.   Wegovy 2.4 MG/0.75ML Soaj Generic drug: Semaglutide-Weight Management   ZINC PO Take by mouth.        Allergies: No Known Allergies  Family History: Family History  Problem Relation Age of Onset   Dementia Mother    Breast cancer Mother    Coronary artery disease Mother    Coronary artery disease Father    Hypertension Father    Diabetes type II Father    Hyperlipidemia Father    GER disease Father     Social History:  reports that he quit smoking about 15 years ago. His smoking use included cigarettes. He smoked an average of 1.5 packs per day. He has never used smokeless tobacco. He reports current alcohol use. He reports that he does not use drugs.  ROS: All other review of systems were reviewed and are negative except what is noted above in HPI  Physical Exam: BP 125/79   Pulse 71   Constitutional:  Alert and oriented, No acute distress. HEENT:  AT, moist mucus membranes.  Trachea midline, no masses. Cardiovascular: No clubbing, cyanosis, or edema. Respiratory: Normal respiratory effort, no increased work of breathing. GI: Abdomen is soft, nontender, nondistended, no abdominal masses GU: No CVA tenderness.  Lymph: No cervical or inguinal lymphadenopathy. Skin: No rashes, bruises or suspicious lesions. Neurologic: Grossly intact, no focal deficits, moving all 4 extremities. Psychiatric: Normal mood and  affect.  Laboratory Data: Lab Results  Component Value Date   WBC 12.4 (H) 04/08/2019   HGB 14.9 04/08/2019   HCT 45.3 04/08/2019   MCV 88.8 04/08/2019   PLT 236 04/08/2019    Lab Results  Component Value Date   CREATININE 0.94 04/08/2019    No results found for: "PSA"  No results found for: "TESTOSTERONE"  No results found for: "HGBA1C"  Urinalysis    Component Value Date/Time   COLORURINE YELLOW 01/05/2019 2056   APPEARANCEUR Clear 01/17/2021 0930   LABSPEC 1.017 01/05/2019 2056   PHURINE 5.0 01/05/2019 2056   GLUCOSEU Negative 01/17/2021 0930   HGBUR LARGE (A) 01/05/2019 2056   BILIRUBINUR Negative 01/17/2021 0930   KETONESUR 20 (A) 01/05/2019 2056   PROTEINUR Negative 01/17/2021 0930   PROTEINUR 30 (A) 01/05/2019 2056   UROBILINOGEN 0.2 05/24/2019 1504   UROBILINOGEN 0.2 03/27/2010 1655   NITRITE Negative 01/17/2021 0930   NITRITE NEGATIVE 01/05/2019 2056   LEUKOCYTESUR Negative 01/17/2021 0930   LEUKOCYTESUR SMALL (A) 01/05/2019  2056    Lab Results  Component Value Date   LABMICR Comment 01/17/2021   BACTERIA RARE (A) 01/05/2019    Pertinent Imaging:  Results for orders placed during the hospital encounter of 01/04/19  DG Abd 1 View  Narrative CLINICAL DATA:  Left flank pain.  History of left ureteral calculus.  EXAM: ABDOMEN - 1 VIEW  COMPARISON:  CT scan 01/02/2019  FINDINGS: Stable appearing position of the left ureteral calculus located adjacent to the L4 vertebral body. Stable lower pole right renal calculus.  The bowel gas pattern is unremarkable. Scattered air in stool noted in the colon. No free air.  IMPRESSION: 1. Stable mid left ureteral calculus. 2. Right lower pole renal calculus. 3. Unremarkable bowel gas pattern.   Electronically Signed By: Marijo Sanes M.D. On: 01/04/2019 06:39  No results found for this or any previous visit.  No results found for this or any previous visit.  No results found for this or any  previous visit.  Results for orders placed during the hospital encounter of 02/26/19  US RENAL  Narrative CLINICAL DATA:  Nephrolithiasis.  Gross hematuria.  EXAM: RENAL / URINARY TRACT ULTRASOUND COMPLETE  COMPARISON:  CT, 01/02/2019  FINDINGS: Right Kidney:  Renal measurements: 13.0 x 7.2 x 7.5 cm = volume: 365.3 mL. Normal parenchymal echogenicity. No masses. Shadowing echogenic stone in the lower pole, 8 mm. No hydronephrosis.  Left Kidney:  Renal measurements: 13.3 x 7.7 x 6.8 cm = volume: 364.8 mL. Normal parenchymal echogenicity. No masses. Shadowing echogenic stone in the lower pole, 10 mm. No hydronephrosis.  Bladder:  Mildly distended. There are calcifications projecting at the bladder base. No mass.  Other:  Enlarged prostate, volume calculated at 288 mL.  IMPRESSION: 1. No acute findings.  No hydronephrosis. 2. Nonobstructing stones in the lower pole of each kidney, on the right present on the prior CT, not seen on the prior CT in the left kidney. 3. Stones project at the bladder base.  No bladder mass. 4. Significant prosthetic enlargement.   Electronically Signed By: Lajean Manes M.D. On: 02/26/2019 15:53  No valid procedures specified. No results found for this or any previous visit.  Results for orders placed during the hospital encounter of 01/02/19  CT Renal Stone Study  Narrative CLINICAL DATA:  Left flank pain.  EXAM: CT ABDOMEN AND PELVIS WITHOUT CONTRAST  TECHNIQUE: Multidetector CT imaging of the abdomen and pelvis was performed following the standard protocol without IV contrast.  COMPARISON:  03/27/2010  FINDINGS: Lower chest: 10 mm lobular nodule in the left lower lobe is stable compared to chest CT of 08/11/2018. This nodule measured 9 mm on the cardiac CT of 10/17/2017 and was 5 mm on a chest CT of 02/11/2012.  Hepatobiliary: No suspicious focal abnormality within the liver parenchyma. There is no evidence for  gallstones, gallbladder wall thickening, or pericholecystic fluid. No intrahepatic or extrahepatic biliary dilation.  Pancreas: No focal mass lesion. No dilatation of the main duct. No intraparenchymal cyst. No peripancreatic edema.  Spleen: No splenomegaly. No focal mass lesion.  Adrenals/Urinary Tract: No adrenal nodule or mass. 5 mm nonobstructing stone noted lower pole right kidney. No right ureteral stone. No secondary changes in the right kidney or ureter. No left renal stones although there is mild left hydronephrosis. 7 x 7 x 4 mm stone identified in the proximal left ureter (axial 46/series 2). No other left urinary stone. 2.1 cm water density lesion lower pole left kidney is compatible with  a cyst. No bladder stone.  Stomach/Bowel: Small to moderate hiatal hernia. Duodenum is normally positioned as is the ligament of Treitz. No small bowel wall thickening. No small bowel dilatation. The terminal ileum is normal. The appendix is best seen on coronal images and is unremarkable. No gross colonic mass. No colonic wall thickening. Diverticular changes are noted in the left colon without evidence of diverticulitis.  Vascular/Lymphatic: There is abdominal aortic atherosclerosis without aneurysm. There is no gastrohepatic or hepatoduodenal ligament lymphadenopathy. No retroperitoneal or mesenteric lymphadenopathy. No pelvic sidewall lymphadenopathy.  Reproductive: Prostate gland is markedly enlarged.  Other: No intraperitoneal free fluid.  Musculoskeletal: No worrisome lytic or sclerotic osseous abnormality.  IMPRESSION: 1. 7 x 7 x 4 mm stone identified in the proximal left ureter with mild left hydronephrosis. 2. 5 mm nonobstructing right renal stone. No other urinary stone disease evident on today's study. 3. 10 mm lobular nodule left lower lobe has shown slow growth comparing back over almost 7 years to a study from 2014. Given the growth characteristics, this is  probably benign. Conservatively, follow-up CT chest in 12 months could be used to reassess. 4. Small to moderate hiatal hernia. 5. Marked prostatomegaly. 6.  Aortic Atherosclerois (ICD10-170.0)   Electronically Signed By: Misty Stanley M.D. On: 01/02/2019 10:53   Assessment & Plan:    1. Benign prostatic hyperplasia with incomplete bladder emptying -patient defers therapy at this time - Urinalysis, Routine w reflex microscopic  2. Urinary urgency -patient defers therapy at this time  3. Erectile dysfunction due to arterial insufficiency -We discussed VED, MUSE, ICI and IPP. We will trial VED for several month   No follow-ups on file.  [name], MD  [facility] [place]

## 2022-02-06 DIAGNOSIS — H5232 Aniseikonia: Secondary | ICD-10-CM | POA: Diagnosis not present

## 2022-02-06 LAB — URINALYSIS, ROUTINE W REFLEX MICROSCOPIC
Bilirubin, UA: NEGATIVE
Glucose, UA: NEGATIVE
Ketones, UA: NEGATIVE
Leukocytes,UA: NEGATIVE
Nitrite, UA: NEGATIVE
RBC, UA: NEGATIVE
Specific Gravity, UA: 1.025 (ref 1.005–1.030)
Urobilinogen, Ur: 0.2 mg/dL (ref 0.2–1.0)
pH, UA: 5.5 (ref 5.0–7.5)

## 2022-02-28 ENCOUNTER — Other Ambulatory Visit: Payer: Self-pay | Admitting: Urology

## 2022-03-05 ENCOUNTER — Encounter: Payer: Self-pay | Admitting: Internal Medicine

## 2022-03-05 ENCOUNTER — Ambulatory Visit: Payer: PPO | Attending: Internal Medicine | Admitting: Internal Medicine

## 2022-03-05 VITALS — BP 122/74 | HR 68 | Ht 71.0 in | Wt 267.0 lb

## 2022-03-05 DIAGNOSIS — I4891 Unspecified atrial fibrillation: Secondary | ICD-10-CM | POA: Diagnosis not present

## 2022-03-05 MED ORDER — METOPROLOL TARTRATE 25 MG PO TABS: 25.0000 mg | ORAL_TABLET | Freq: Two times a day (BID) | ORAL | 3 refills | Status: DC

## 2022-03-05 NOTE — Progress Notes (Signed)
HPI James Norris is referred today by Dr. Johnsie Norris. He is a pleasant 72 yo man with a h/o PAF, coronary calcification, dyslipedemia and remote tobacco abuse, stopping 5 years ago. He wears an Apple watch which has shown periods of atrial fib for which he is minimally if at all symptomatic. The patient has a h/o sleep apnea, and has been on CPAP. He admits to dietary indiscretion. He has preserved LV dysfunction.  No Known Allergies   Current Outpatient Medications  Medication Sig Dispense Refill   ALPRAZolam (XANAX) 0.5 MG tablet Take 0.5 mg by mouth 3 (three) times daily as needed for anxiety.      apixaban (ELIQUIS) 5 MG TABS tablet Take 1 tablet (5 mg total) by mouth 2 (two) times daily. 60 tablet 5   metoprolol tartrate (LOPRESSOR) 25 MG tablet Take 1 tablet (25 mg total) by mouth 2 (two) times daily. 180 tablet 3   Multiple Vitamin (MULTIVITAMIN) tablet Take 1 tablet by mouth daily.     Multiple Vitamins-Minerals (ZINC PO) Take by mouth.     pantoprazole (PROTONIX) 40 MG tablet Take 1 tablet (40 mg total) by mouth daily before breakfast. NEEDS OFFICE VISIT 90 tablet 3   rosuvastatin (CRESTOR) 10 MG tablet TAKE 1 TABLET BY MOUTH ONCE DAILY **NEED  OFFICE  VISIT** 90 tablet 3   sildenafil (REVATIO) 20 MG tablet Take 20 mg by mouth 3 (three) times daily.     tadalafil (CIALIS) 20 MG tablet TAKE 1 TABLET BY MOUTH AS NEEDED FOR ERECTILE DYSFUNCTION 10 tablet 0   omeprazole (PRILOSEC) 20 MG capsule Take 20 mg by mouth daily. (Patient not taking: Reported on 03/05/2022)     No current facility-administered medications for this visit.     Past Medical History:  Diagnosis Date   Anxiety    GERD (gastroesophageal reflux disease)    Hyperlipemia    Kidney stone    Nephrolithiasis 01/06/2019   Sleep apnea    cpap    ROS:   All systems reviewed and negative except as noted in the HPI.   Past Surgical History:  Procedure Laterality Date   BALLOON DILATION N/A 03/11/2012    Procedure: BALLOON DILATION;  Surgeon: Rogene Houston, MD;  [facility];  Service: Endoscopy;  Laterality: N/A;   COLONOSCOPY WITH ESOPHAGOGASTRODUODENOSCOPY (EGD) N/A 03/11/2012   Procedure: COLONOSCOPY WITH ESOPHAGOGASTRODUODENOSCOPY (EGD);  Surgeon: Rogene Houston, MD;  [facility];  Service: Endoscopy;  Laterality: N/A;  [place] Left 01/25/2019   Procedure: CYSTOSCOPY WITH LEFT RETROGRADE PYELOGRAM;  Surgeon: Cleon Gustin, MD;  [facility];  Service: Urology;  Laterality: Left;   CYSTOSCOPY W/ URETERAL STENT REMOVAL Left 01/25/2019   Procedure: CYSTOSCOPY WITH LEFT URETERAL STENT REMOVAL;  Surgeon: Cleon Gustin, MD;  [facility];  Service: Urology;  Laterality: Left;   CYSTOSCOPY/URETEROSCOPY/HOLMIUM LASER/STENT PLACEMENT Left 01/06/2019   Procedure: CYSTOSCOPY/URETEROSCOPY//STENT PLACEMENT WITH LEFT RETROGRADE PYELOGRAM;  Surgeon: Cleon Gustin, MD;  [facility];  Service: Urology;  Laterality: Left;   ESOPHAGEAL DILATION N/A 02/15/2015   Procedure: ESOPHAGEAL DILATION;  Surgeon: Rogene Houston, MD;  [facility];  Service: Endoscopy;  Laterality: N/A;   ESOPHAGOGASTRODUODENOSCOPY N/A 02/15/2015   Procedure: ESOPHAGOGASTRODUODENOSCOPY (EGD);  Surgeon: Rogene Houston, MD;  [facility];  Service: Endoscopy;  Laterality: N/A;  2:00   EXTRACORPOREAL SHOCK WAVE LITHOTRIPSY Left 01/04/2019   Procedure: EXTRACORPOREAL SHOCK WAVE LITHOTRIPSY (  ESWL);  Surgeon: Alexis Frock, MD;  [facility];  Service: Urology;  Laterality: Left;   EYE SURGERY     lasik- bilaterally   TONSILLECTOMY     URETEROSCOPY Left 01/25/2019   Procedure: DIAGNOSTIC LEFT URETEROSCOPY;  Surgeon: Cleon Gustin, MD;  [facility];  Service: Urology;  Laterality: Left;   XI ROBOTIC ASSISTED SIMPLE PROSTATECTOMY N/A 04/05/2019   Procedure: XI ROBOTIC ASSISTED SIMPLE PROSTATECTOMY;  Surgeon: Cleon Gustin, MD;   [facility];  Service: Urology;  Laterality: N/A;  3 HRS     Family History  Problem Relation Age of Onset   Dementia Mother    Breast cancer Mother    Coronary artery disease Mother    Coronary artery disease Father    Hypertension Father    Diabetes type II Father    Hyperlipidemia Father    GER disease Father      Social History   Socioeconomic History   Marital status: Married    Spouse name: Not on file   Number of children: Not on file   Years of education: Not on file   Highest education level: Not on file  Occupational History   Not on file  Tobacco Use   Smoking status: Former    Packs/day: 1.50    Types: Cigarettes    Quit date: 08/27/2006    Years since quitting: 15.5   Smokeless tobacco: Never   Tobacco comments:    quit 7 yrs ago  Vaping Use   Vaping Use: Never used  Substance and Sexual Activity   Alcohol use: Yes    Alcohol/week: 0.0 standard drinks of alcohol    Comment: social rare   Drug use: No   Sexual activity: Yes  Other Topics Concern   Not on file  Social History Narrative   Not on file   Social Determinants of Health   Financial Resource Strain: Not on file  Food Insecurity: No Food Insecurity (08/31/2021)   Hunger Vital Sign    Worried About Running Out of Food in the Last Year: Never true    Ran Out of Food in the Last Year: Never true  Transportation Needs: No Transportation Needs (08/31/2021)   PRAPARE - Hydrologist (Medical): No    Lack of Transportation (Non-Medical): No  Physical Activity: Not on file  Stress: Not on file  Social Connections: Not on file  Intimate Partner Violence: Not on file     BP 122/74   Pulse 68   Ht '5\' 11"'$  (1.803 m)   Wt 267 lb (121.1 kg)   SpO2 97%   BMI 37.24 kg/m   Physical Exam:  Well appearing NAD HEENT: Unremarkable Neck:  No JVD, no thyromegally Lymphatics:  No adenopathy Back:  No CVA tenderness Lungs:  Clear with no wheezes HEART:  Regular  rate rhythm, no murmurs, no rubs, no clicks Abd:  soft, positive bowel sounds, no organomegally, no rebound, no guarding Ext:  2 plus pulses, no edema, no cyanosis, no clubbing Skin:  No rashes no nodules Neuro:  CN II through XII intact, motor grossly intact  EKG - nsr with IRBBB  Assess/Plan: PAF - we discussed the treatment options with the patient. As he is minimally symptomatic, I do not think catheter ablation is warranted. His obesity with an elevated BMI makes it unlikely to benefit him at the current time. I asked him to monitor his HR and if it goes above 120,  he is encouraged to take an extra dose of short acting metoprolol. Coags - he has not had bleeding on eliquis. Continue. Obesity - he is encouraged to work on weight loss.  Carleene Overlie Lauran Romanski,MD

## 2022-03-05 NOTE — Patient Instructions (Signed)
Medication Instructions:   May take an extra Metoprolol for heart rate of 120 or higher.   *If you need a refill on your cardiac medications before your next appointment, please call your pharmacy*   Lab Work: NONE   If you have labs (blood work) drawn today and your tests are completely normal, you will receive your results only by: [place] (if you have MyChart) OR A paper copy in the mail If you have any lab test that is abnormal or we need to change your treatment, we will call you to review the results.   Testing/Procedures: NONE     Follow-Up: At [facility], you and your health needs are our priority.  As part of our continuing mission to provide you with exceptional heart care, we have created designated Provider Care Teams.  These Care Teams include your primary Cardiologist (physician) and Advanced Practice Providers (APPs -  Physician Assistants and Nurse Practitioners) who all work together to provide you with the care you need, when you need it.  We recommend signing up for the patient portal called "MyChart".  Sign up information is provided on this After Visit Summary.  MyChart is used to connect with patients for Virtual Visits (Telemedicine).  Patients are able to view lab/test results, encounter notes, upcoming appointments, etc.  Non-urgent messages can be sent to your provider as well.   To learn more about what you can do with MyChart, go to [email or web address].    Your next appointment:   6 month(s)  Provider:   Cristopher Peru, MD    Other Instructions Thank you for choosing [place]!

## 2022-03-06 ENCOUNTER — Encounter (INDEPENDENT_AMBULATORY_CARE_PROVIDER_SITE_OTHER): Payer: PPO | Admitting: Ophthalmology

## 2022-03-06 DIAGNOSIS — H35373 Puckering of macula, bilateral: Secondary | ICD-10-CM | POA: Diagnosis not present

## 2022-03-06 DIAGNOSIS — H43813 Vitreous degeneration, bilateral: Secondary | ICD-10-CM

## 2022-03-06 DIAGNOSIS — H33302 Unspecified retinal break, left eye: Secondary | ICD-10-CM | POA: Diagnosis not present

## 2022-03-18 ENCOUNTER — Telehealth (INDEPENDENT_AMBULATORY_CARE_PROVIDER_SITE_OTHER): Payer: Self-pay | Admitting: Gastroenterology

## 2022-03-18 ENCOUNTER — Encounter (INDEPENDENT_AMBULATORY_CARE_PROVIDER_SITE_OTHER): Payer: Self-pay | Admitting: Gastroenterology

## 2022-03-18 ENCOUNTER — Other Ambulatory Visit: Payer: Self-pay | Admitting: *Deleted

## 2022-03-18 ENCOUNTER — Ambulatory Visit (INDEPENDENT_AMBULATORY_CARE_PROVIDER_SITE_OTHER): Payer: PPO | Admitting: Gastroenterology

## 2022-03-18 VITALS — BP 117/63 | HR 63 | Temp 98.0°F | Ht 71.0 in | Wt 250.0 lb

## 2022-03-18 DIAGNOSIS — L578 Other skin changes due to chronic exposure to nonionizing radiation: Secondary | ICD-10-CM | POA: Diagnosis not present

## 2022-03-18 DIAGNOSIS — L570 Actinic keratosis: Secondary | ICD-10-CM | POA: Diagnosis not present

## 2022-03-18 DIAGNOSIS — Z85828 Personal history of other malignant neoplasm of skin: Secondary | ICD-10-CM | POA: Diagnosis not present

## 2022-03-18 DIAGNOSIS — Z08 Encounter for follow-up examination after completed treatment for malignant neoplasm: Secondary | ICD-10-CM | POA: Diagnosis not present

## 2022-03-18 DIAGNOSIS — Z79899 Other long term (current) drug therapy: Secondary | ICD-10-CM

## 2022-03-18 DIAGNOSIS — L820 Inflamed seborrheic keratosis: Secondary | ICD-10-CM | POA: Diagnosis not present

## 2022-03-18 DIAGNOSIS — K2100 Gastro-esophageal reflux disease with esophagitis, without bleeding: Secondary | ICD-10-CM | POA: Diagnosis not present

## 2022-03-18 DIAGNOSIS — L821 Other seborrheic keratosis: Secondary | ICD-10-CM | POA: Diagnosis not present

## 2022-03-18 DIAGNOSIS — D229 Melanocytic nevi, unspecified: Secondary | ICD-10-CM | POA: Diagnosis not present

## 2022-03-18 NOTE — Telephone Encounter (Signed)
    03/18/22  James Norris 03/09/1950  What type of surgery is being performed? Colonoscopy   When is surgery scheduled? TBD  Request to stop Eliquis   Name of physician performing surgery?  Dr. Maylon Peppers [facility] Phone: [phone] Fax: [phone]  Anethesia type (none, local, MAC, general)? MAC

## 2022-03-18 NOTE — Progress Notes (Signed)
James Norris, M.D. Gastroenterology & Hepatology [facility] [address] [place], [place] [address]  Primary Care Physician: [employer], [place] [id number]  I will communicate my assessment and recommendations to the referring MD via EMR.  Problems: GERD  History of Present Illness: James Norris is a 72 y.o. male with past medical history of anxiety, GERD, hyperlipidemia, kidney stones and OSA, who presents for follow up of GERD.  The patient was last seen on 01/11/2021. At that time, the patient was continued on pantoprazole 40 mg qday.   Patient denies having any complaints.  Has been taking pantoprazole 40 mg every day without presenting any heartburn, dysphagia or odynophagia.  Reports feeling well and states that he will like to continue on the same dosage of the medication. The patient denies having any nausea, vomiting, fever, chills, hematochezia, melena, hematemesis, abdominal distention, abdominal pain, diarrhea, jaundice, pruritus or weight loss.  Last Colonoscopy: 2/2014Prep excellent. Normal mucosa of terminal ileum. Normal mucosa of colon and rectum. Unremarkable anorectal junction.   Last Endoscopy: Feb 2017Healed erosive esophagitis. Schatzki's ring and moderate-sized sliding hiatal hernia. Schatzki's ring dilated/disrupted with balloon dilator as above. No evidence of gastritis or peptic ulcer.  Past Medical History: Past Medical History:  Diagnosis Date   Anxiety    GERD (gastroesophageal reflux disease)    Hyperlipemia    Kidney stone    Nephrolithiasis 01/06/2019   Sleep apnea    cpap    Past Surgical History: Past Surgical History:  Procedure Laterality Date   BALLOON DILATION N/A 03/11/2012   Procedure: BALLOON DILATION;  Surgeon: Rogene Houston, MD;  [facility];  Service: Endoscopy;  Laterality: N/A;   COLONOSCOPY WITH ESOPHAGOGASTRODUODENOSCOPY (EGD)  N/A 03/11/2012   Procedure: COLONOSCOPY WITH ESOPHAGOGASTRODUODENOSCOPY (EGD);  Surgeon: Rogene Houston, MD;  [facility];  Service: Endoscopy;  Laterality: N/A;  [place] Left 01/25/2019   Procedure: CYSTOSCOPY WITH LEFT RETROGRADE PYELOGRAM;  Surgeon: Cleon Gustin, MD;  Location: [facility];  Service: Urology;  Laterality: Left;   CYSTOSCOPY W/ URETERAL STENT REMOVAL Left 01/25/2019   Procedure: CYSTOSCOPY WITH LEFT URETERAL STENT REMOVAL;  Surgeon: Cleon Gustin, MD;  Location: [facility];  Service: Urology;  Laterality: Left;   CYSTOSCOPY/URETEROSCOPY/HOLMIUM LASER/STENT PLACEMENT Left 01/06/2019   Procedure: CYSTOSCOPY/URETEROSCOPY//STENT PLACEMENT WITH LEFT RETROGRADE PYELOGRAM;  Surgeon: Cleon Gustin, MD;  Location: [facility];  Service: Urology;  Laterality: Left;   ESOPHAGEAL DILATION N/A 02/15/2015   Procedure: ESOPHAGEAL DILATION;  Surgeon: Rogene Houston, MD;  [facility];  Service: Endoscopy;  Laterality: N/A;   ESOPHAGOGASTRODUODENOSCOPY N/A 02/15/2015   Procedure: ESOPHAGOGASTRODUODENOSCOPY (EGD);  Surgeon: Rogene Houston, MD;  [facility];  Service: Endoscopy;  Laterality: N/A;  2:00   EXTRACORPOREAL SHOCK WAVE LITHOTRIPSY Left 01/04/2019   Procedure: EXTRACORPOREAL SHOCK WAVE LITHOTRIPSY (ESWL);  Surgeon: Alexis Frock, MD;  [facility];  Service: Urology;  Laterality: Left;   EYE SURGERY     lasik- bilaterally   TONSILLECTOMY     URETEROSCOPY Left 01/25/2019   Procedure: DIAGNOSTIC LEFT URETEROSCOPY;  Surgeon: Cleon Gustin, MD;  Location[facility];  Service: Urology;  Laterality: Left;   XI ROBOTIC ASSISTED SIMPLE PROSTATECTOMY N/A 04/05/2019   Procedure: XI ROBOTIC ASSISTED SIMPLE PROSTATECTOMY;  Surgeon: Cleon Gustin, MD;  [facility];  Service: Urology;  Laterality: N/A;  3 HRS    Family History:  Family History  Problem Relation Age of Onset   Dementia Mother    Breast cancer Mother     Coronary artery disease Mother    Coronary artery disease Father    Hypertension Father    Diabetes type II Father    Hyperlipidemia Father    GER disease Father     Social History: Social History   Tobacco Use  Smoking Status Former   Packs/day: 1.50   Types: Cigarettes   Quit date: 08/27/2006   Years since quitting: 15.5  Smokeless Tobacco Never  Tobacco Comments   quit 7 yrs ago   Social History   Substance and Sexual Activity  Alcohol Use Yes   Alcohol/week: 0.0 standard drinks of alcohol   Comment: social rare   Social History   Substance and Sexual Activity  Drug Use No    Allergies: No Known Allergies  Medications: Current Outpatient Medications  Medication Sig Dispense Refill   ALPRAZolam (XANAX) 0.5 MG tablet Take 0.5 mg by mouth 3 (three) times daily as needed for anxiety.      apixaban (ELIQUIS) 5 MG TABS tablet Take 1 tablet (5 mg total) by mouth 2 (two) times daily. 60 tablet 5   metoprolol tartrate (LOPRESSOR) 25 MG tablet Take 1 tablet (25 mg total) by mouth 2 (two) times daily. May take any extra tablet for heart rate of 120 or greater 180 tablet 3   Multiple Vitamin (MULTIVITAMIN) tablet Take 1 tablet by mouth daily.     Multiple Vitamins-Minerals (ZINC PO) Take by mouth.     omeprazole (PRILOSEC) 20 MG capsule Take 20 mg by mouth daily. (Patient not taking: Reported on 03/05/2022)     pantoprazole (PROTONIX) 40 MG tablet Take 1 tablet (40 mg total) by mouth daily before breakfast. NEEDS OFFICE VISIT 90 tablet 3   rosuvastatin (CRESTOR) 10 MG tablet TAKE 1 TABLET BY MOUTH ONCE DAILY **NEED  OFFICE  VISIT** 90 tablet 3   sildenafil (REVATIO) 20 MG tablet Take 20 mg by mouth 3 (three) times daily.     tadalafil (CIALIS) 20 MG tablet TAKE 1 TABLET BY MOUTH AS NEEDED FOR ERECTILE DYSFUNCTION 10 tablet 0   No current facility-administered medications for this visit.    Review of Systems: GENERAL: negative for malaise, night sweats HEENT: No changes  in hearing or vision, no nose bleeds or other nasal problems. NECK: Negative for lumps, goiter, pain and significant neck swelling RESPIRATORY: Negative for cough, wheezing CARDIOVASCULAR: Negative for chest pain, leg swelling, palpitations, orthopnea GI: SEE HPI MUSCULOSKELETAL: Negative for joint pain or swelling, back pain, and muscle pain. SKIN: Negative for lesions, rash PSYCH: Negative for sleep disturbance, mood disorder and recent psychosocial stressors. HEMATOLOGY Negative for prolonged bleeding, bruising easily, and swollen nodes. ENDOCRINE: Negative for cold or heat intolerance, polyuria, polydipsia and goiter. NEURO: negative for tremor, gait imbalance, syncope and seizures. The remainder of the review of systems is noncontributory.   Physical Exam: There were no vitals taken for this visit. GENERAL: The patient is AO x3, in no acute distress. HEENT: Head is normocephalic and atraumatic. EOMI are intact. Mouth is well hydrated and without lesions. NECK: Supple. No masses LUNGS: Clear to auscultation. No presence of rhonchi/wheezing/rales. Adequate chest expansion HEART: RRR, normal s1 and s2. ABDOMEN: Soft, nontender, no guarding, no peritoneal signs, and nondistended. BS +. No masses. EXTREMITIES: Without any cyanosis, clubbing, rash, lesions or edema. NEUROLOGIC: AOx3, no focal motor deficit. SKIN: no jaundice, no rashes  Imaging/Labs: as above  I personally reviewed and interpreted the available labs, imaging and endoscopic files.  Impression and Plan: VAUGHAN MOWAT is a 72 y.o. male with past medical history of anxiety, GERD, hyperlipidemia, kidney stones and OSA, who presents for follow up of GERD.  The patient has presented adequate control of his symptoms while taking pantoprazole 40 mg every day which he should continue for now.  No red flag signs at the moment that would warrant a further evaluation.  In terms of his colorectal cancer screening, he is due for  repeat colonoscopy which will be scheduled today.  Will obtain clearance to stop Eliquis.  -Schedule colonoscopy, Sutab prep -Continue pantoprazole 40 mg qday  All questions were answered.      James Peppers, MD Gastroenterology and Hepatology [facility]

## 2022-03-18 NOTE — Telephone Encounter (Signed)
Callback team please contact patient to have CBC and BMET completed prior to guidance on Eliquis being granted.  He has future labs that were ordered in January that were never collected.  If he requires updated labs they can be ordered at this time.  Please let me know if you have any additional questions.  Ambrose Pancoast, NP

## 2022-03-18 NOTE — Telephone Encounter (Signed)
Pt aware and will go to [facility] in [place] for labs.

## 2022-03-18 NOTE — Addendum Note (Signed)
Addended by: Gaetano Net on: 03/18/2022 03:07 PM   Modules accepted: Orders

## 2022-03-18 NOTE — Patient Instructions (Signed)
Schedule colonoscopy, Sutab prep Continue pantoprazole 40 mg qday

## 2022-03-21 DIAGNOSIS — H16213 Exposure keratoconjunctivitis, bilateral: Secondary | ICD-10-CM | POA: Diagnosis not present

## 2022-03-21 DIAGNOSIS — H01004 Unspecified blepharitis left upper eyelid: Secondary | ICD-10-CM | POA: Diagnosis not present

## 2022-03-21 DIAGNOSIS — H01002 Unspecified blepharitis right lower eyelid: Secondary | ICD-10-CM | POA: Diagnosis not present

## 2022-03-21 DIAGNOSIS — H01001 Unspecified blepharitis right upper eyelid: Secondary | ICD-10-CM | POA: Diagnosis not present

## 2022-03-31 ENCOUNTER — Other Ambulatory Visit: Payer: Self-pay | Admitting: Urology

## 2022-04-01 DIAGNOSIS — S80812A Abrasion, left lower leg, initial encounter: Secondary | ICD-10-CM | POA: Diagnosis not present

## 2022-04-01 DIAGNOSIS — L089 Local infection of the skin and subcutaneous tissue, unspecified: Secondary | ICD-10-CM | POA: Diagnosis not present

## 2022-04-01 DIAGNOSIS — E6609 Other obesity due to excess calories: Secondary | ICD-10-CM | POA: Diagnosis not present

## 2022-04-01 DIAGNOSIS — Z6836 Body mass index (BMI) 36.0-36.9, adult: Secondary | ICD-10-CM | POA: Diagnosis not present

## 2022-04-02 DIAGNOSIS — S80812A Abrasion, left lower leg, initial encounter: Secondary | ICD-10-CM | POA: Diagnosis not present

## 2022-05-13 DIAGNOSIS — H2511 Age-related nuclear cataract, right eye: Secondary | ICD-10-CM | POA: Diagnosis not present

## 2022-05-13 DIAGNOSIS — H18413 Arcus senilis, bilateral: Secondary | ICD-10-CM | POA: Diagnosis not present

## 2022-05-13 DIAGNOSIS — H35373 Puckering of macula, bilateral: Secondary | ICD-10-CM | POA: Diagnosis not present

## 2022-05-13 DIAGNOSIS — H35431 Paving stone degeneration of retina, right eye: Secondary | ICD-10-CM | POA: Diagnosis not present

## 2022-05-18 ENCOUNTER — Other Ambulatory Visit (INDEPENDENT_AMBULATORY_CARE_PROVIDER_SITE_OTHER): Payer: Self-pay | Admitting: Gastroenterology

## 2022-05-23 NOTE — Telephone Encounter (Signed)
He has not completed lab work that we can see.  Levi Aland, NP-C  05/23/2022, 12:57 PM [address], [address] Office [phone] Fax [phone]

## 2022-05-23 NOTE — Telephone Encounter (Signed)
Any update on medication clearance? Please advise. Thank you 

## 2022-05-30 ENCOUNTER — Encounter (INDEPENDENT_AMBULATORY_CARE_PROVIDER_SITE_OTHER): Payer: Self-pay

## 2022-05-30 ENCOUNTER — Telehealth (INDEPENDENT_AMBULATORY_CARE_PROVIDER_SITE_OTHER): Payer: Self-pay | Admitting: Gastroenterology

## 2022-05-30 NOTE — Telephone Encounter (Signed)
Letter mailed to patient asking pt to complete labs from cardiology so we can get clearance to hold Eliquis (please see medication clearance message).   (Room 3; screening; Sutab prep)

## 2022-06-07 DIAGNOSIS — H04123 Dry eye syndrome of bilateral lacrimal glands: Secondary | ICD-10-CM | POA: Insufficient documentation

## 2022-06-19 ENCOUNTER — Other Ambulatory Visit: Payer: Self-pay | Admitting: Ophthalmology

## 2022-06-19 ENCOUNTER — Encounter: Payer: Self-pay | Admitting: Ophthalmology

## 2022-06-19 DIAGNOSIS — H04123 Dry eye syndrome of bilateral lacrimal glands: Secondary | ICD-10-CM | POA: Diagnosis not present

## 2022-06-19 DIAGNOSIS — H05241 Constant exophthalmos, right eye: Secondary | ICD-10-CM

## 2022-06-19 DIAGNOSIS — H02135 Senile ectropion of left lower eyelid: Secondary | ICD-10-CM | POA: Diagnosis not present

## 2022-06-19 DIAGNOSIS — H05242 Constant exophthalmos, left eye: Secondary | ICD-10-CM | POA: Diagnosis not present

## 2022-06-19 DIAGNOSIS — I10 Essential (primary) hypertension: Secondary | ICD-10-CM | POA: Insufficient documentation

## 2022-06-19 DIAGNOSIS — Z79899 Other long term (current) drug therapy: Secondary | ICD-10-CM | POA: Diagnosis not present

## 2022-06-19 DIAGNOSIS — H05249 Constant exophthalmos, unspecified eye: Secondary | ICD-10-CM

## 2022-06-19 DIAGNOSIS — H11003 Unspecified pterygium of eye, bilateral: Secondary | ICD-10-CM | POA: Diagnosis not present

## 2022-06-19 DIAGNOSIS — I4891 Unspecified atrial fibrillation: Secondary | ICD-10-CM | POA: Insufficient documentation

## 2022-06-19 DIAGNOSIS — H02132 Senile ectropion of right lower eyelid: Secondary | ICD-10-CM | POA: Diagnosis not present

## 2022-06-19 DIAGNOSIS — H11823 Conjunctivochalasis, bilateral: Secondary | ICD-10-CM | POA: Diagnosis not present

## 2022-06-19 DIAGNOSIS — H0270 Unspecified degenerative disorders of eyelid and periocular area: Secondary | ICD-10-CM | POA: Diagnosis not present

## 2022-06-19 DIAGNOSIS — E039 Hypothyroidism, unspecified: Secondary | ICD-10-CM | POA: Insufficient documentation

## 2022-06-19 LAB — BASIC METABOLIC PANEL
BUN/Creatinine Ratio: 13 (ref 10–24)
BUN: 13 mg/dL (ref 8–27)
CO2: 22 mmol/L (ref 20–29)
Calcium: 9.3 mg/dL (ref 8.6–10.2)
Chloride: 106 mmol/L (ref 96–106)
Creatinine, Ser: 1.03 mg/dL (ref 0.76–1.27)
Glucose: 107 mg/dL — ABNORMAL HIGH (ref 70–99)
Potassium: 5.1 mmol/L (ref 3.5–5.2)
Sodium: 140 mmol/L (ref 134–144)
eGFR: 77 mL/min/{1.73_m2} (ref >59)

## 2022-06-19 LAB — CBC
Hematocrit: 47.6 % (ref 37.5–51.0)
Hemoglobin: 16 g/dL (ref 13.0–17.7)
MCH: 29.2 pg (ref 26.6–33.0)
MCHC: 33.6 g/dL (ref 31.5–35.7)
MCV: 87 fL (ref 79–97)
Platelets: 240 10*3/uL (ref 150–450)
RBC: 5.48 x10E6/uL (ref 4.14–5.80)
RDW: 12.5 % (ref 11.6–15.4)
WBC: 6.6 10*3/uL (ref 3.4–10.8)

## 2022-06-20 NOTE — Telephone Encounter (Signed)
Pharmacy please advise on holding Eliquis prior to colonoscopy scheduled for TBD. Thank you.   

## 2022-06-20 NOTE — Telephone Encounter (Signed)
Patient with diagnosis of atrial fibrillation on Eliquis for anticoagulation.    Procedure: colonoscopy Date of procedure: TBD   CHA2DS2-VASc Score = 2   This indicates a 2.2% annual risk of stroke. The patient's score is based upon: CHF History: 0 HTN History: 0 Diabetes History: 0 Stroke History: 0 Vascular Disease History: 1 Age Score: 1 Gender Score: 0   CrCl 104 Platelet count 240  Per office protocol, patient can hold Eliquis for 2 days prior to procedure.   Patient will not need bridging with Lovenox (enoxaparin) around procedure.  **This guidance is not considered finalized until pre-operative APP has relayed final recommendations.**

## 2022-06-21 ENCOUNTER — Telehealth: Payer: Self-pay | Admitting: *Deleted

## 2022-06-21 NOTE — Telephone Encounter (Signed)
  Patient Consent for Virtual Visit         James Norris has provided verbal consent on 06/21/2022 for a virtual visit (video or telephone).   CONSENT FOR VIRTUAL VISIT FOR:  James Norris  By participating in this virtual visit I agree to the following:  I hereby voluntarily request, consent and authorize [facility] and its employed or contracted physicians, physician assistants, nurse practitioners or other licensed health care professionals (the Practitioner), to provide me with telemedicine health care services (the "Services") as deemed necessary by the treating Practitioner. I acknowledge and consent to receive the Services by the Practitioner via telemedicine. I understand that the telemedicine visit will involve communicating with the Practitioner through live audiovisual communication technology and the disclosure of certain medical information by electronic transmission. I acknowledge that I have been given the opportunity to request an in-person assessment or other available alternative prior to the telemedicine visit and am voluntarily participating in the telemedicine visit.  I understand that I have the right to withhold or withdraw my consent to the use of telemedicine in the course of my care at any time, without affecting my right to future care or treatment, and that the Practitioner or I may terminate the telemedicine visit at any time. I understand that I have the right to inspect all information obtained and/or recorded in the course of the telemedicine visit and may receive copies of available information for a reasonable fee.  I understand that some of the potential risks of receiving the Services via telemedicine include:  Delay or interruption in medical evaluation due to technological equipment failure or disruption; Information transmitted may not be sufficient (e.g. poor resolution of images) to allow for appropriate medical decision making by the  Practitioner; and/or  In rare instances, security protocols could fail, causing a breach of personal health information.  Furthermore, I acknowledge that it is my responsibility to provide information about my medical history, conditions and care that is complete and accurate to the best of my ability. I acknowledge that Practitioner's advice, recommendations, and/or decision may be based on factors not within their control, such as incomplete or inaccurate data provided by me or distortions of diagnostic images or specimens that may result from electronic transmissions. I understand that the practice of medicine is not an exact science and that Practitioner makes no warranties or guarantees regarding treatment outcomes. I acknowledge that a copy of this consent can be made available to me via my patient portal [facility] MyChart), or I can request a printed copy by calling the office of [facility].    I understand that my insurance will be billed for this visit.   I have read or had this consent read to me. I understand the contents of this consent, which adequately explains the benefits and risks of the Services being provided via telemedicine.  I have been provided ample opportunity to ask questions regarding this consent and the Services and have had my questions answered to my satisfaction. I give my informed consent for the services to be provided through the use of telemedicine in my medical care

## 2022-06-21 NOTE — Telephone Encounter (Signed)
Primary Cardiologist:Peter Eden Emms, MD   Preoperative team, please contact this patient and set up a phone call appointment for further preoperative risk assessment. Please obtain consent and complete medication review. Thank you for your help.   I confirm that guidance regarding antiplatelet and oral anticoagulation therapy has been completed and, if necessary, noted below.   Levi Aland, NP-C  06/21/2022, 6:57 AM [address] Office [phone] Fax [phone]

## 2022-06-24 ENCOUNTER — Ambulatory Visit
Admission: RE | Admit: 2022-06-24 | Discharge: 2022-06-24 | Disposition: A | Discharge status: Home / Self Care | Payer: PPO | Source: Ambulatory Visit | Attending: Ophthalmology | Admitting: Ophthalmology

## 2022-06-24 DIAGNOSIS — H05241 Constant exophthalmos, right eye: Secondary | ICD-10-CM

## 2022-06-24 DIAGNOSIS — H05242 Constant exophthalmos, left eye: Secondary | ICD-10-CM

## 2022-06-24 DIAGNOSIS — H0520 Unspecified exophthalmos: Secondary | ICD-10-CM | POA: Diagnosis not present

## 2022-06-27 IMAGING — DX DG CHEST 2V
2 series · 2 of 2 positions shown · non-contrast
Comparison: CT chest 04/08/2019

CLINICAL DATA: History of tobacco use.

EXAM:
CHEST - 2 VIEW

[chest pa]
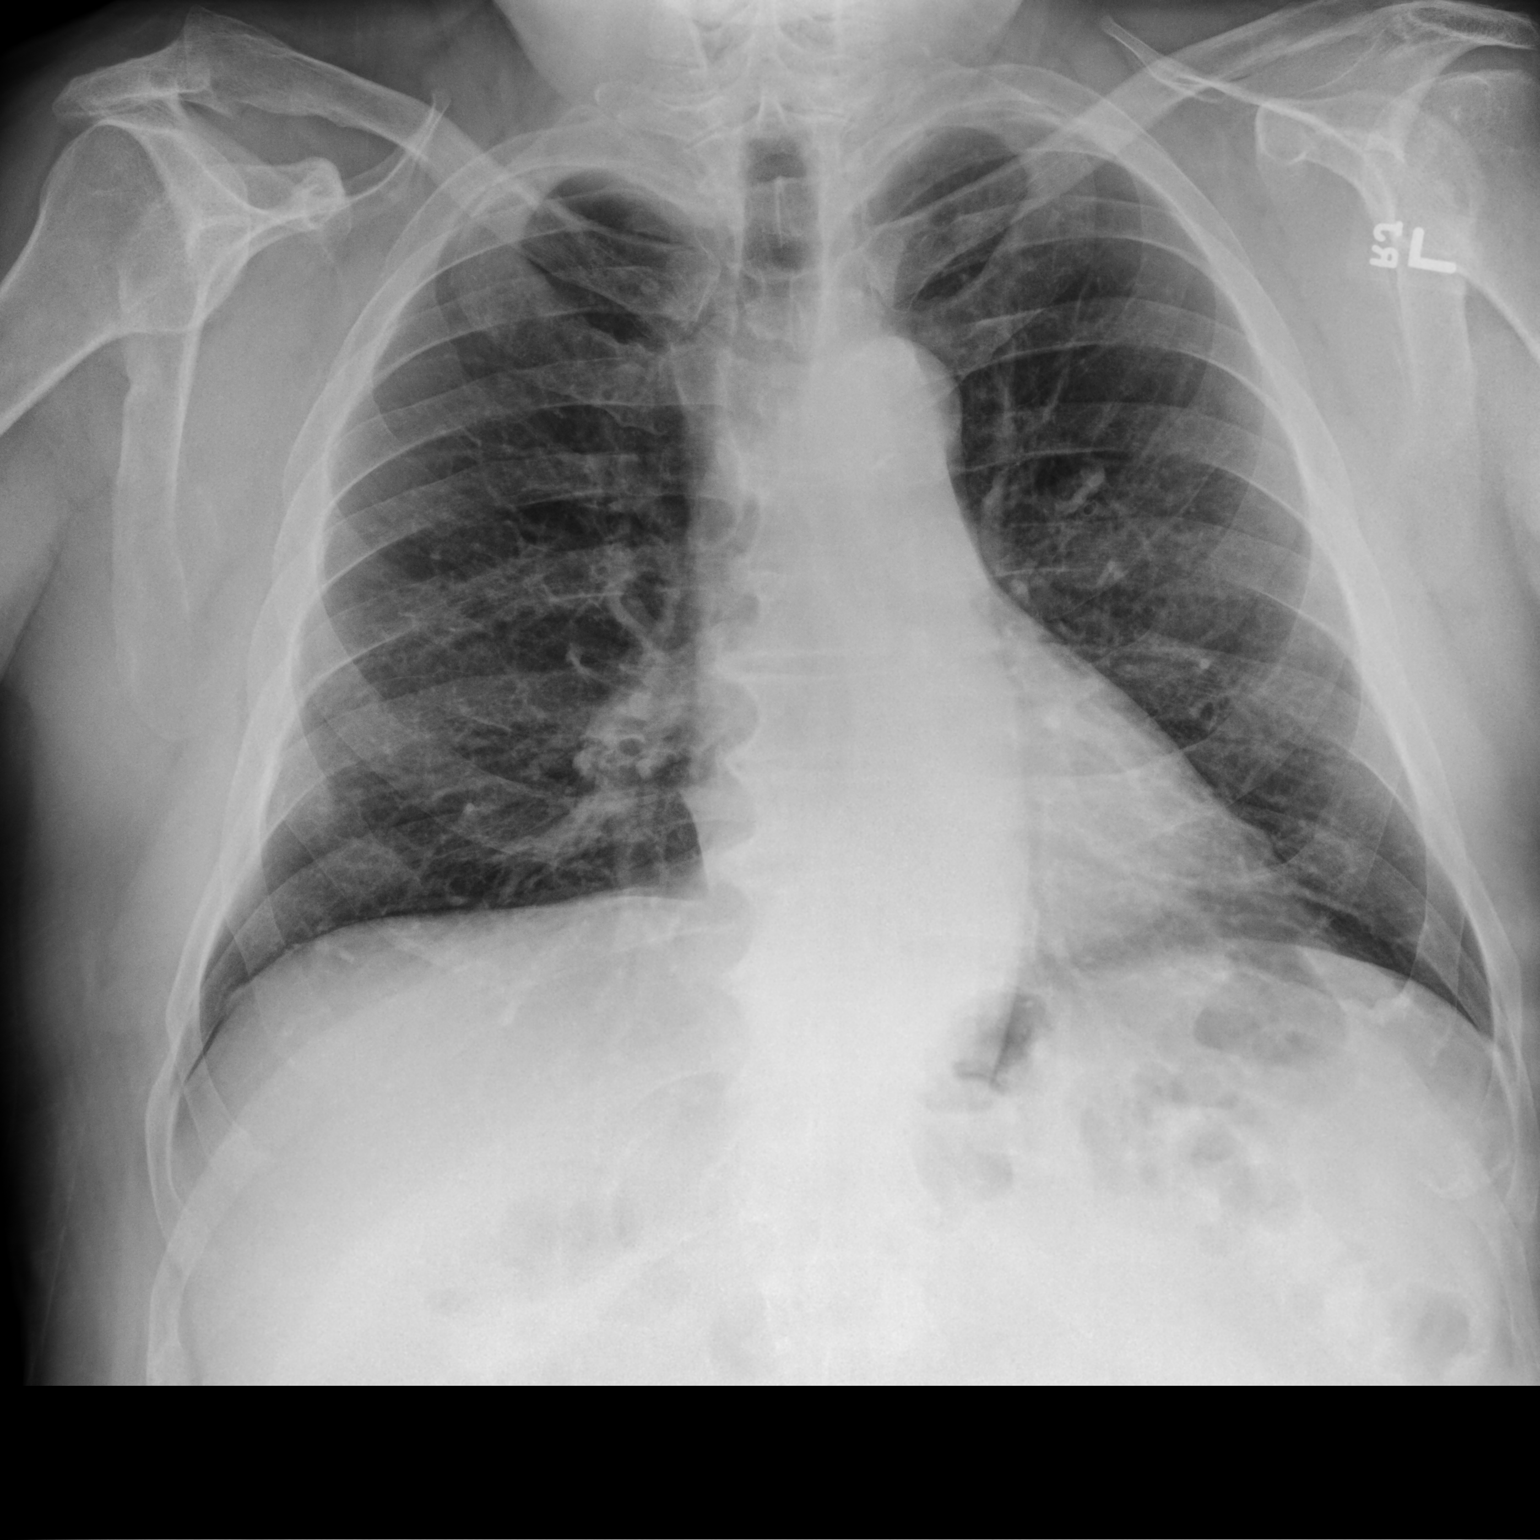

[chest lat]
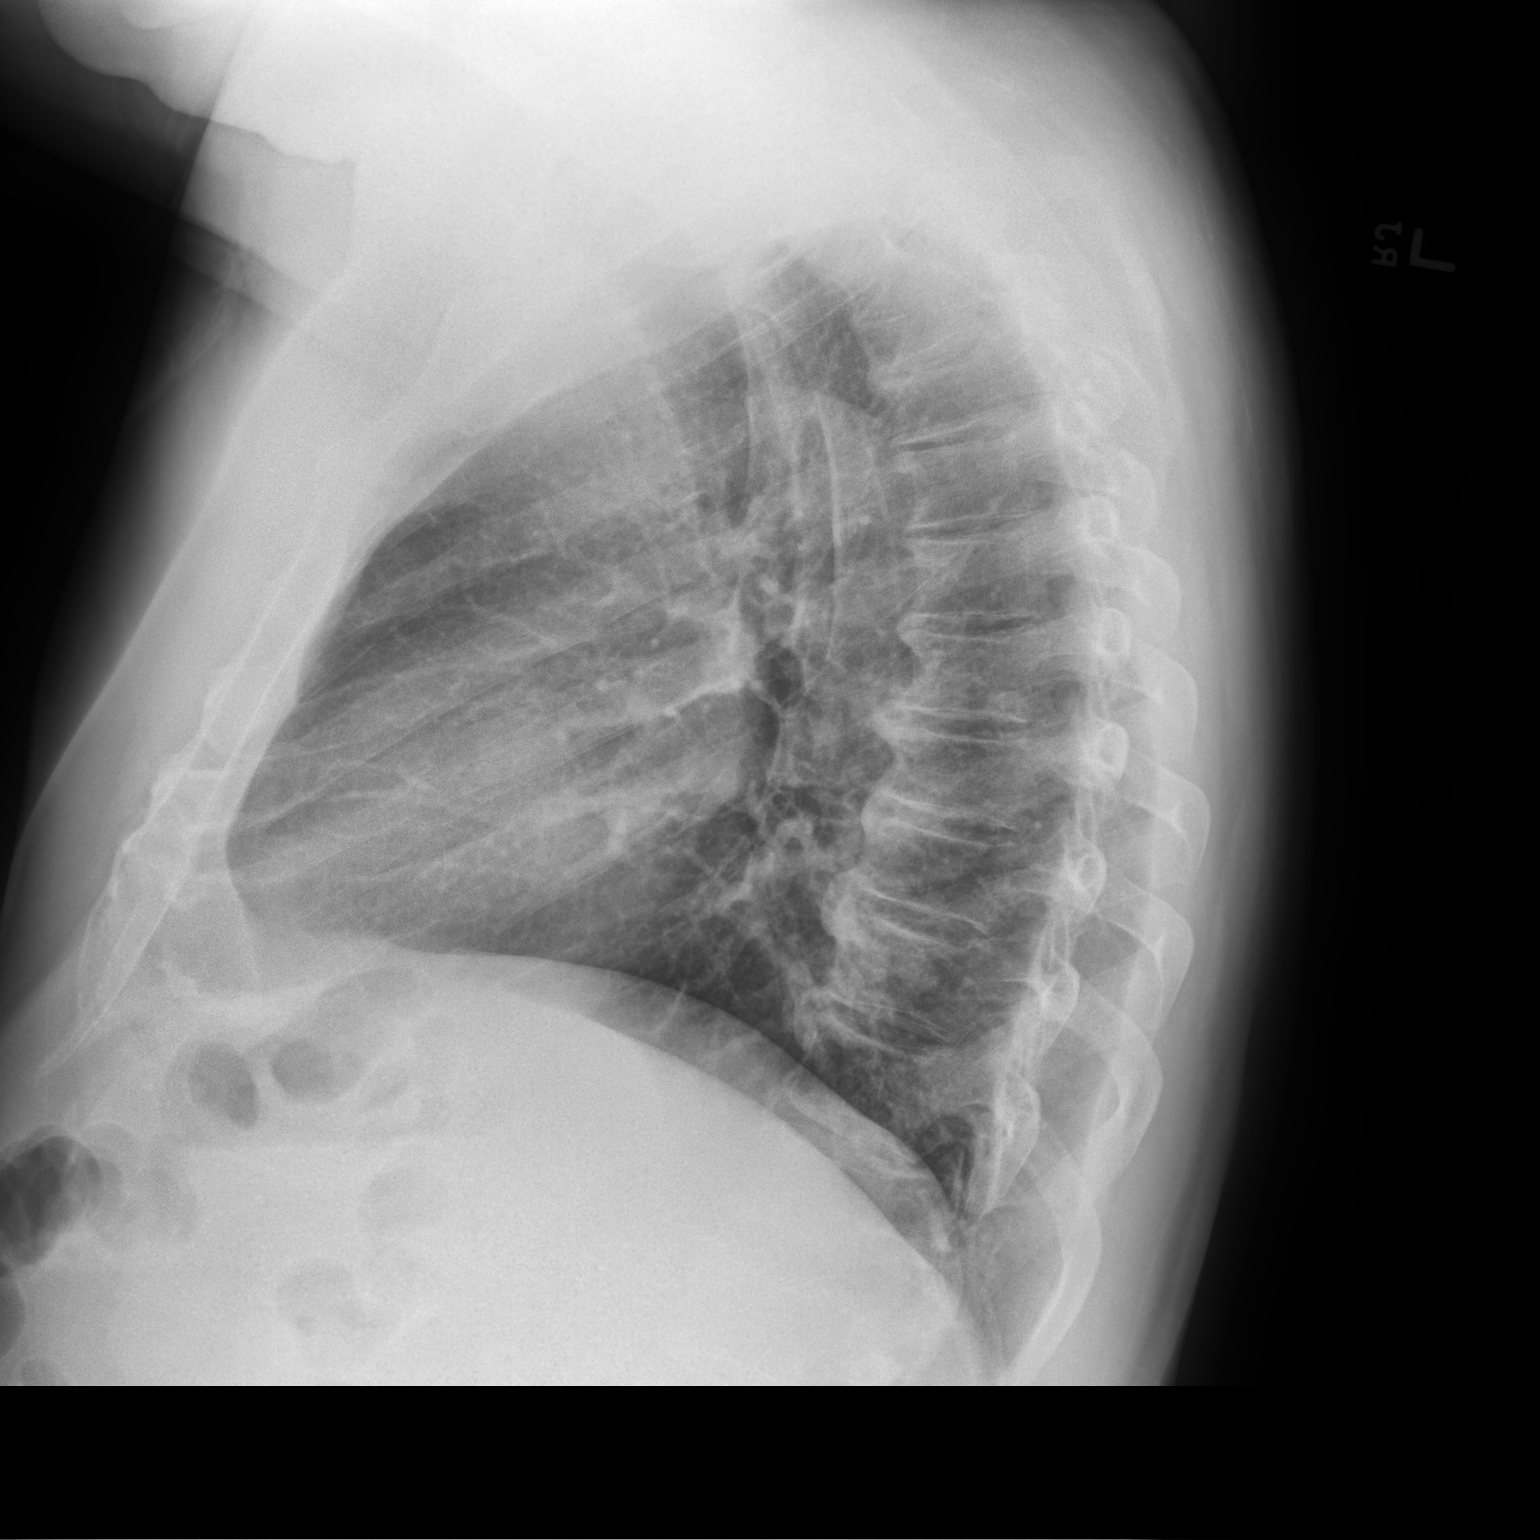

[2 of 2 positions shown; findings below may reference images not displayed]

FINDINGS: The cardiac silhouette, mediastinal and hilar contours are within
normal limits and stable. Stable mild tortuosity and calcification
of the thoracic aorta. The lungs are clear of an acute process. No
pulmonary lesions. No pleural effusions. The bony thorax is intact.
Stable degenerative changes involving the thoracic spine.
IMPRESSION: No acute cardiopulmonary findings.

## 2022-07-26 ENCOUNTER — Ambulatory Visit: Payer: PPO | Attending: Cardiology | Admitting: Nurse Practitioner

## 2022-07-26 DIAGNOSIS — Z01810 Encounter for preprocedural cardiovascular examination: Secondary | ICD-10-CM

## 2022-07-26 NOTE — Progress Notes (Signed)
Virtual Visit via Telephone Note   Because of James Norris's co-morbid illnesses, he is at least at moderate risk for complications without adequate follow up.  This format is felt to be most appropriate for this patient at this time.  The patient did not have access to video technology/had technical difficulties with video requiring transitioning to audio format only (telephone).  All issues noted in this document were discussed and addressed.  No physical exam could be performed with this format.  Please refer to the patient's chart for his consent to telehealth for James Norris.  Evaluation Performed:  Preoperative cardiovascular risk assessment _____________   Date:  07/26/2022   Patient ID:  James Norris, DOB Jul 15, 1950, MRN [id number] Patient Location:  Home Provider location:   Office  Primary Care Provider:  Elfredia Nevins, MD Primary Cardiologist:  Charlton Haws, MD  Chief Complaint / Patient Profile   72 y.o. y/o male with a h/o coronary artery calcification, paroxysmal atrial fibrillation, dyslipidemia, OSA and former tobacco use who is pending colonoscopy with Dr. Katrinka Blazing of [facility] at [facility] and presents today for telephonic preoperative cardiovascular risk assessment.   History of Present Illness    James Norris is a 72 y.o. male who presents via audio/video conferencing for a telehealth visit today.  Pt was last seen in cardiology clinic on 03/05/2022 by Dr. Ladona Ridgel.  At that time James Norris was doing well. The patient is now pending procedure as outlined above. Since his last visit, he has done well from a cardiac standpoint.   He denies chest pain, palpitations, dyspnea, pnd, orthopnea, n, v, dizziness, syncope, edema, weight gain, or early satiety. All other systems reviewed and are otherwise negative except as noted above.   Past Medical History    Past Medical History:  Diagnosis Date   Anxiety    GERD (gastroesophageal  reflux disease)    Hyperlipemia    Kidney stone    Nephrolithiasis 01/06/2019   Sleep apnea    cpap   Past Surgical History:  Procedure Laterality Date   BALLOON DILATION N/A 03/11/2012   Procedure: BALLOON DILATION;  Surgeon: Malissa Hippo, MD;  [facility];  Service: Endoscopy;  Laterality: N/A;   COLONOSCOPY WITH ESOPHAGOGASTRODUODENOSCOPY (EGD) N/A 03/11/2012   Procedure: COLONOSCOPY WITH ESOPHAGOGASTRODUODENOSCOPY (EGD);  Surgeon: Malissa Hippo, MD;  [facility];  Service: Endoscopy;  Laterality: N/A;  1200   CYSTOSCOPY W/ RETROGRADES Left 01/25/2019   Procedure: CYSTOSCOPY WITH LEFT RETROGRADE PYELOGRAM;  Surgeon: Malen Gauze, MD;  [facility];  Service: Urology;  Laterality: Left;   CYSTOSCOPY W/ URETERAL STENT REMOVAL Left 01/25/2019   Procedure: CYSTOSCOPY WITH LEFT URETERAL STENT REMOVAL;  Surgeon: Malen Gauze, MD;  [facility];  Service: Urology;  Laterality: Left;   CYSTOSCOPY/URETEROSCOPY/HOLMIUM LASER/STENT PLACEMENT Left 01/06/2019   Procedure: CYSTOSCOPY/URETEROSCOPY//STENT PLACEMENT WITH LEFT RETROGRADE PYELOGRAM;  Surgeon: Malen Gauze, MD;  [facility];  Service: Urology;  Laterality: Left;   ESOPHAGEAL DILATION N/A 02/15/2015   Procedure: ESOPHAGEAL DILATION;  Surgeon: Malissa Hippo, MD;  [facility];  Service: Endoscopy;  Laterality: N/A;   ESOPHAGOGASTRODUODENOSCOPY N/A 02/15/2015   Procedure: ESOPHAGOGASTRODUODENOSCOPY (EGD);  Surgeon: Malissa Hippo, MD;  [facility];  Service: Endoscopy;  Laterality: N/A;  2:00   EXTRACORPOREAL SHOCK WAVE LITHOTRIPSY Left 01/04/2019   Procedure: EXTRACORPOREAL SHOCK WAVE LITHOTRIPSY (ESWL);  Surgeon: Sebastian Ache, MD;  [facility];  Service: Urology;  Laterality: Left;   EYE SURGERY     lasik- bilaterally   TONSILLECTOMY     URETEROSCOPY Left 01/25/2019   Procedure: DIAGNOSTIC LEFT URETEROSCOPY;  Surgeon: Malen Gauze, MD;  [facility];  Service: Urology;  Laterality: Left;   XI ROBOTIC ASSISTED SIMPLE PROSTATECTOMY N/A 04/05/2019   Procedure: XI ROBOTIC ASSISTED SIMPLE PROSTATECTOMY;  Surgeon: Malen Gauze, MD;  [facility];  Service: Urology;  Laterality: N/A;  3 HRS    Allergies  No Known Allergies  Home Medications    Prior to Admission medications   Medication Sig Start Date End Date Taking? Authorizing Provider  ALPRAZolam Prudy Feeler) 0.5 MG tablet Take 0.5 mg by mouth 3 (three) times daily as needed for anxiety.  12/13/11   [provider]  apixaban (ELIQUIS) 5 MG TABS tablet Take 1 tablet (5 mg total) by mouth 2 (two) times daily. 01/15/22   Wendall Stade, MD  metoprolol tartrate (LOPRESSOR) 25 MG tablet Take 1 tablet (25 mg total) by mouth 2 (two) times daily. May take any extra tablet for heart rate of 120 or greater 03/05/22 02/28/23  Marinus Maw, MD  Multiple Vitamin (MULTIVITAMIN) tablet Take 1 tablet by mouth daily.    [provider]  pantoprazole (PROTONIX) 40 MG tablet TAKE 1 TABLET BY MOUTH ONCE DAILY BEFORE  BREAKFAST  **NEEDS  OFFICE  VISIT** 05/20/22   Marguerita Merles, Reuel Boom, MD  rosuvastatin (CRESTOR) 10 MG tablet TAKE 1 TABLET BY MOUTH ONCE DAILY **NEED  OFFICE  VISIT** 01/01/22   Wendall Stade, MD  sildenafil (REVATIO) 20 MG tablet Take 20 mg by mouth 3 (three) times daily. 05/03/19   [provider]  tadalafil (CIALIS) 20 MG tablet TAKE 1 TABLET BY MOUTH AS NEEDED FOR ERECTILE DYSFUNCTION 04/02/22   McKenzie, Mardene Celeste, MD    Physical Exam    Vital Signs:  Roderic Scarce does not have vital signs available for review today.  Given telephonic nature of communication, physical exam is limited. AAOx3. NAD. Normal affect. Speech and respirations are unlabored.  Accessory Clinical Findings    None  Assessment & Plan    1.  Preoperative Cardiovascular Risk Assessment:  According to the Revised Cardiac Risk Index (RCRI), his Perioperative Risk of  Major Cardiac Event is (%): 0.4. His Functional Capacity in METs is: 9.89 according to the Duke Activity Status Index (DASI).Therefore, based on ACC/AHA guidelines, patient would be at acceptable risk for the planned procedure without further cardiovascular testing.  The patient was advised that if he develops new symptoms prior to surgery to contact our office to arrange for a follow-up visit, and he verbalized understanding.  Per office protocol, patient can hold Eliquis for 2 days prior to procedure.   Patient will not need bridging with Lovenox (enoxaparin) around procedure. Please resume Eliquis as soon as possible postprocedure, at the discretion of the surgeon.    A copy of this note will be routed to requesting surgeon.  Time:   Today, I have spent 5 minutes with the patient with telehealth technology discussing medical history, symptoms, and management plan.     Joylene Grapes, NP  07/26/2022, 9:57 AM

## 2022-08-01 DIAGNOSIS — H0270 Unspecified degenerative disorders of eyelid and periocular area: Secondary | ICD-10-CM | POA: Diagnosis not present

## 2022-08-01 DIAGNOSIS — H02135 Senile ectropion of left lower eyelid: Secondary | ICD-10-CM | POA: Diagnosis not present

## 2022-08-01 DIAGNOSIS — H05242 Constant exophthalmos, left eye: Secondary | ICD-10-CM | POA: Diagnosis not present

## 2022-08-01 DIAGNOSIS — H04123 Dry eye syndrome of bilateral lacrimal glands: Secondary | ICD-10-CM | POA: Diagnosis not present

## 2022-08-01 DIAGNOSIS — H11823 Conjunctivochalasis, bilateral: Secondary | ICD-10-CM | POA: Diagnosis not present

## 2022-08-01 DIAGNOSIS — H11003 Unspecified pterygium of eye, bilateral: Secondary | ICD-10-CM | POA: Diagnosis not present

## 2022-08-01 DIAGNOSIS — H02132 Senile ectropion of right lower eyelid: Secondary | ICD-10-CM | POA: Diagnosis not present

## 2022-08-01 DIAGNOSIS — H05241 Constant exophthalmos, right eye: Secondary | ICD-10-CM | POA: Diagnosis not present

## 2022-08-04 ENCOUNTER — Other Ambulatory Visit (INDEPENDENT_AMBULATORY_CARE_PROVIDER_SITE_OTHER): Payer: Self-pay | Admitting: Gastroenterology

## 2022-08-22 ENCOUNTER — Other Ambulatory Visit: Payer: Self-pay | Admitting: Urology

## 2022-09-05 ENCOUNTER — Encounter: Payer: Self-pay | Admitting: Internal Medicine

## 2022-09-05 ENCOUNTER — Ambulatory Visit: Payer: PPO | Attending: Internal Medicine | Admitting: Internal Medicine

## 2022-09-05 VITALS — BP 134/84 | HR 61 | Ht 71.0 in | Wt 267.0 lb

## 2022-09-05 DIAGNOSIS — I480 Paroxysmal atrial fibrillation: Secondary | ICD-10-CM

## 2022-09-05 NOTE — Patient Instructions (Signed)

## 2022-09-05 NOTE — Progress Notes (Signed)
HPI [name] returns today for followup of atrial fib. He is a pleasant 72 yo man with a h/o PAF, coronary calcification, dyslipidemia and remote tobacco abuse, stopping over 5 years ago. He wears an Apple watch which has shown periods of atrial fib for which he is minimally if at all symptomatic. The patient has a h/o sleep apnea, and has been on CPAP. He admits to dietary indiscretion. He has preserved LV dysfunction. He just got back from a sailing trip in the [place].   No Known Allergies   Current Outpatient Medications  Medication Sig Dispense Refill   ALPRAZolam (XANAX) 0.5 MG tablet Take 0.5 mg by mouth 3 (three) times daily as needed for anxiety.      apixaban (ELIQUIS) 5 MG TABS tablet Take 1 tablet (5 mg total) by mouth 2 (two) times daily. 60 tablet 5   metoprolol tartrate (LOPRESSOR) 25 MG tablet Take 1 tablet (25 mg total) by mouth 2 (two) times daily. May take any extra tablet for heart rate of 120 or greater 180 tablet 3   Multiple Vitamin (MULTIVITAMIN) tablet Take 1 tablet by mouth daily.     pantoprazole (PROTONIX) 40 MG tablet TAKE 1 TABLET BY MOUTH ONCE DAILY BEFORE BREAKFAST 90 tablet 1   rosuvastatin (CRESTOR) 10 MG tablet TAKE 1 TABLET BY MOUTH ONCE DAILY **NEED  OFFICE  VISIT** 90 tablet 3   sildenafil (REVATIO) 20 MG tablet Take 20 mg by mouth 3 (three) times daily.     tadalafil (CIALIS) 20 MG tablet TAKE 1 TABLET BY MOUTH AS NEEDED FOR ERECTILE DYSFUNCTION 10 tablet 0   No current facility-administered medications for this visit.     Past Medical History:  Diagnosis Date   Anxiety    GERD (gastroesophageal reflux disease)    Hyperlipemia    Kidney stone    Nephrolithiasis 01/06/2019   Sleep apnea    cpap    ROS:   All systems reviewed and negative except as noted in the HPI.   Past Surgical History:  Procedure Laterality Date   BALLOON DILATION N/A 03/11/2012   Procedure: BALLOON DILATION;  Surgeon: Malissa Hippo, MD;  [facility];  Service: Endoscopy;  Laterality: N/A;   COLONOSCOPY WITH ESOPHAGOGASTRODUODENOSCOPY (EGD) N/A 03/11/2012   Procedure: COLONOSCOPY WITH ESOPHAGOGASTRODUODENOSCOPY (EGD);  Surgeon: Malissa Hippo, MD;  [facility];  Service: Endoscopy;  Laterality: N/A;  1200   CYSTOSCOPY W/ RETROGRADES Left 01/25/2019   Procedure: CYSTOSCOPY WITH LEFT RETROGRADE PYELOGRAM;  Surgeon: Malen Gauze, MD;  Location: [facility];  Service: Urology;  Laterality: Left;   CYSTOSCOPY W/ URETERAL STENT REMOVAL Left 01/25/2019   Procedure: CYSTOSCOPY WITH LEFT URETERAL STENT REMOVAL;  Surgeon: Malen Gauze, MD;  Location: [facility];  Service: Urology;  Laterality: Left;   CYSTOSCOPY/URETEROSCOPY/HOLMIUM LASER/STENT PLACEMENT Left 01/06/2019   Procedure: CYSTOSCOPY/URETEROSCOPY//STENT PLACEMENT WITH LEFT RETROGRADE PYELOGRAM;  Surgeon: Malen Gauze, MD;  Location: [facility];  Service: Urology;  Laterality: Left;   ESOPHAGEAL DILATION N/A 02/15/2015   Procedure: ESOPHAGEAL DILATION;  Surgeon: Malissa Hippo, MD;  [facility];  Service: Endoscopy;  Laterality: N/A;   ESOPHAGOGASTRODUODENOSCOPY N/A 02/15/2015   Procedure: ESOPHAGOGASTRODUODENOSCOPY (EGD);  Surgeon: Malissa Hippo, MD;  [facility];  Service: Endoscopy;  Laterality: N/A;  2:00   EXTRACORPOREAL SHOCK WAVE LITHOTRIPSY Left 01/04/2019   Procedure: EXTRACORPOREAL SHOCK WAVE LITHOTRIPSY (ESWL);  Surgeon: Sebastian Ache, MD;  Location: [facility];  Service: Urology;  Laterality: Left;   EYE SURGERY     lasik- bilaterally   TONSILLECTOMY     URETEROSCOPY Left 01/25/2019   Procedure: DIAGNOSTIC LEFT URETEROSCOPY;  Surgeon: Malen Gauze, MD;  Location: [facility];  Service: Urology;  Laterality: Left;   XI ROBOTIC ASSISTED SIMPLE PROSTATECTOMY N/A 04/05/2019   Procedure: XI ROBOTIC ASSISTED SIMPLE PROSTATECTOMY;  Surgeon: Malen Gauze, MD;  Location: [facility];  Service: Urology;  Laterality: N/A;  3 HRS      Family History  Problem Relation Age of Onset   Dementia Mother    Breast cancer Mother    Coronary artery disease Mother    Coronary artery disease Father    Hypertension Father    Diabetes type II Father    Hyperlipidemia Father    GER disease Father      Social History   Socioeconomic History   Marital status: Married    Spouse name: Not on file   Number of children: Not on file   Years of education: Not on file   Highest education level: Not on file  Occupational History   Not on file  Tobacco Use   Smoking status: Former    Current packs/day: 0.00    Types: Cigarettes    Quit date: 08/27/2006    Years since quitting: 16.0    Passive exposure: Past   Smokeless tobacco: Never   Tobacco comments:    quit 7 yrs ago  Vaping Use   Vaping status: Never Used  Substance and Sexual Activity   Alcohol use: Yes    Alcohol/week: 0.0 standard drinks of alcohol    Comment: social rare   Drug use: No   Sexual activity: Yes  Other Topics Concern   Not on file  Social History Narrative   Not on file   Social Determinants of Health   Financial Resource Strain: Not on file  Food Insecurity: No Food Insecurity (08/31/2021)   Hunger Vital Sign    Worried About Running Out of Food in the Last Year: Never true    Ran Out of Food in the Last Year: Never true  Transportation Needs: No Transportation Needs (08/31/2021)   PRAPARE - Administrator, Civil Service (Medical): No    Lack of Transportation (Non-Medical): No  Physical Activity: Not on file  Stress: Not on file  Social Connections: Unknown (05/21/2021)   Received from [facility], [facility]   Social Network    Social Network: Not on file  Intimate Partner Violence: Unknown (04/12/2021)   Received from [facility], [facility]   HITS    Physically Hurt: Not on file    Insult or Talk Down To: Not on file    Threaten Physical Harm: Not on file    Scream or Curse: Not on file     BP 134/84    Pulse 61   Ht 5\' 11"  (1.803 m)   Wt 267 lb (121.1 kg)   SpO2 96%   BMI 37.24 kg/m   Physical Exam:  Well appearing NAD HEENT: Unremarkable Neck:  No JVD, no thyromegally Lymphatics:  No adenopathy Back:  No CVA tenderness Lungs:  Clear with no wheezes HEART:  Regular rate rhythm, no murmurs, no rubs, no clicks Abd:  soft, positive bowel sounds, no organomegally, no rebound, no guarding Ext:  2 plus pulses, no edema, no cyanosis, no clubbing Skin:  No rashes no nodules Neuro:  CN II through XII intact, motor  grossly intact  Assess/Plan: PAF - he is asymptomatic. He will continue his eliquis.  HTN - his bp is controlled today. No change Coags - he has not had any bleeding on eliquis. Obesity - his weight is unchanged from his last visit. He plans to start walking more as the weather cools.  Sharlot Gowda Natalee Tomkiewicz,MD

## 2022-09-16 ENCOUNTER — Telehealth (INDEPENDENT_AMBULATORY_CARE_PROVIDER_SITE_OTHER): Payer: Self-pay | Admitting: Gastroenterology

## 2022-09-16 NOTE — Telephone Encounter (Signed)
    09/16/22  Aram Candela Fassnacht October 09, 1950  What type of surgery is being performed? Colonoscopy  When is surgery scheduled? TBD  What type of clearance is required (medical or pharmacy to hold medication or both? medication  Are there any medications that need to be held prior to surgery and how long? Eliquis hold for 2 days  Name of physician performing surgery?  Dr. Katrinka Blazing [facility] Phone: [phone] Fax: [phone]  Anethesia type (none, local, MAC, general)? MAC

## 2022-09-16 NOTE — Telephone Encounter (Signed)
Patient with diagnosis of atrial fibrillation on Eliquis for anticoagulation.    What type of surgery is being performed? Colonoscopy   When is surgery scheduled? TBD   CHA2DS2-VASc Score = 2   This indicates a 2.2% annual risk of stroke. The patient's score is based upon: CHF History: 0 HTN History: 0 Diabetes History: 0 Stroke History: 0 Vascular Disease History: 1 Age Score: 1 Gender Score: 0  CrCl > 100 Platelet count 240  Per office protocol, patient can hold Eliquis for 2 days prior to procedure.   Patient will not need bridging with Lovenox (enoxaparin) around procedure.  **This guidance is not considered finalized until pre-operative APP has relayed final recommendations.**

## 2022-09-16 NOTE — Telephone Encounter (Signed)
Pharmacy please advise on holding Eliquis prior to colonoscopy scheduled for TBD. Thank you.   

## 2022-09-17 NOTE — Telephone Encounter (Signed)
   Patient Name: James Norris  DOB: 04-24-1950 MRN: [id number]  Primary Cardiologist: Charlton Haws, MD  Chart reviewed as part of pre-operative protocol coverage. Given past medical history and time since last visit, based on ACC/AHA guidelines, ALPHUS SWEE is at acceptable risk for the planned procedure without further cardiovascular testing.   Last seen by Dr. Sharrell Ku on 09/05/2022.  He he denied any periods of palpitations, chest pain, or shortness of breath.  Blood pressure was 134/84.  He was completely asymptomatic and remained on Eliquis.  He denied any bleeding or hemoptysis when inquired by Dr. Ladona Ridgel.  He was advised to increase his activity level and begin a walking program.  The patient had already decided to move forward with this when the weather became cooler.  Per office protocol, patient can hold Eliquis for 2 days prior to procedure.   Patient will not need bridging with Lovenox (enoxaparin) around procedure.  The patient was advised that if he develops new symptoms prior to surgery to contact our office to arrange for a follow-up visit, and he verbalized understanding.  I will route this recommendation to the requesting party via Epic fax function and remove from pre-op pool.  Please call with questions.  Joni Reining, NP 09/17/2022, 3:27 PM

## 2022-09-23 DIAGNOSIS — H16223 Keratoconjunctivitis sicca, not specified as Sjogren's, bilateral: Secondary | ICD-10-CM | POA: Diagnosis not present

## 2022-09-23 DIAGNOSIS — H01001 Unspecified blepharitis right upper eyelid: Secondary | ICD-10-CM | POA: Diagnosis not present

## 2022-09-23 DIAGNOSIS — H01004 Unspecified blepharitis left upper eyelid: Secondary | ICD-10-CM | POA: Diagnosis not present

## 2022-09-23 DIAGNOSIS — H01005 Unspecified blepharitis left lower eyelid: Secondary | ICD-10-CM | POA: Diagnosis not present

## 2022-09-23 DIAGNOSIS — H01002 Unspecified blepharitis right lower eyelid: Secondary | ICD-10-CM | POA: Diagnosis not present

## 2022-10-30 ENCOUNTER — Telehealth (INDEPENDENT_AMBULATORY_CARE_PROVIDER_SITE_OTHER): Payer: Self-pay | Admitting: Gastroenterology

## 2022-10-30 MED ORDER — SUTAB 1479-225-188 MG PO TABS: ORAL_TABLET | ORAL | 0 refills | Status: DC

## 2022-10-30 NOTE — Telephone Encounter (Signed)
Pt contacted and TCS scheduled. Instructions will be sent once pre op is received. Prep sent to pharmacy (pt requesting sutab).

## 2022-10-31 DIAGNOSIS — Z1331 Encounter for screening for depression: Secondary | ICD-10-CM | POA: Diagnosis not present

## 2022-10-31 DIAGNOSIS — E782 Mixed hyperlipidemia: Secondary | ICD-10-CM | POA: Diagnosis not present

## 2022-10-31 DIAGNOSIS — E6609 Other obesity due to excess calories: Secondary | ICD-10-CM | POA: Diagnosis not present

## 2022-10-31 DIAGNOSIS — G4733 Obstructive sleep apnea (adult) (pediatric): Secondary | ICD-10-CM | POA: Diagnosis not present

## 2022-10-31 DIAGNOSIS — R1032 Left lower quadrant pain: Secondary | ICD-10-CM | POA: Diagnosis not present

## 2022-10-31 DIAGNOSIS — R7309 Other abnormal glucose: Secondary | ICD-10-CM | POA: Diagnosis not present

## 2022-10-31 DIAGNOSIS — Z6838 Body mass index (BMI) 38.0-38.9, adult: Secondary | ICD-10-CM | POA: Diagnosis not present

## 2022-10-31 DIAGNOSIS — N400 Enlarged prostate without lower urinary tract symptoms: Secondary | ICD-10-CM | POA: Diagnosis not present

## 2022-10-31 DIAGNOSIS — Z0001 Encounter for general adult medical examination with abnormal findings: Secondary | ICD-10-CM | POA: Diagnosis not present

## 2022-11-01 ENCOUNTER — Encounter (INDEPENDENT_AMBULATORY_CARE_PROVIDER_SITE_OTHER): Payer: Self-pay

## 2022-11-06 NOTE — Patient Instructions (Signed)
James Norris  11/06/2022     @PREFPERIOPPHARMACY @   Your procedure is scheduled on 11/12/2022.   [facility] to [facility] at 9:40 [facility].M.    Call this number if you have problems the morning of surgery:  [phone]  If you experience any cold or flu symptoms such as cough, fever, chills, shortness of breath, etc. between now and your scheduled surgery, please notify us at the above number.   Remember:    Please Follow the diet and prep instructions given to you by Dr Wilburt Finlay office.    Take these medicines the morning of surgery with A SIP OF WATER : Metoprolol Pantoprazole and Revatio   Last dose of Eliquis should be 11/09/2022    Do not wear jewelry, make-up or nail polish, including gel polish,  artificial nails, or any other type of covering on natural nails (fingers and  toes).  Do not wear lotions, powders, or perfumes, or deodorant.  Do not shave 48 hours prior to surgery.  Men may shave face and neck.  Do not bring valuables to the hospital.  [facility] is not responsible for any belongings or valuables.  Contacts, dentures or bridgework may not be worn into surgery.  Leave your suitcase in the car.  After surgery it may be brought to your room.  For patients admitted to the hospital, discharge time will be determined by your treatment team.  Patients discharged the day of surgery will not be allowed to drive home.   Name and phone number of your driver:   Family Special instructions:  N/A  Please read over the following fact sheets that you were given. Care and Recovery After Surgery   Colonoscopy, Adult A colonoscopy is a procedure to look at the entire large intestine. This procedure is done using a long, thin, flexible tube that has a camera on the end. You may have a colonoscopy: As a part of normal colorectal screening. If you have certain symptoms, such as: A low number of red blood cells in your blood (anemia). Diarrhea that does not go  away. Pain in your abdomen. Blood in your stool. A colonoscopy can help screen for and diagnose medical problems, including: An abnormal growth of cells or tissue (tumor). Abnormal growths within the lining of your intestine (polyps). Inflammation. Areas of bleeding. Tell your health care provider about: Any allergies you have. All medicines you are taking, including vitamins, herbs, eye drops, creams, and over-the-counter medicines. Any problems you or family members have had with anesthetic medicines. Any bleeding problems you have. Any surgeries you have had. Any medical conditions you have. Any problems you have had with having bowel movements. Whether you are pregnant or may be pregnant. What are the risks? Generally, this is a safe procedure. However, problems may occur, including: Bleeding. Damage to your intestine. Allergic reactions to medicines given during the procedure. Infection. This is rare. What happens before the procedure? Eating and drinking restrictions Follow instructions from your health care provider about eating or drinking restrictions, which may include: A few days before the procedure: Follow a low-fiber diet. Avoid nuts, seeds, dried fruit, raw fruits, and vegetables. 1-3 days before the procedure: Eat only gelatin dessert or ice pops. Drink only clear liquids, such as water, clear juice, clear broth or bouillon, black coffee or tea, or clear soft drinks or sports drinks. Avoid liquids that contain red or purple dye. The day of the procedure: Do not eat solid foods. You may continue  to drink clear liquids until up to 2 hours before the procedure. Do not eat or drink anything starting 2 hours before the procedure, or within the time period that your health care provider recommends. Bowel prep If you were prescribed a bowel prep to take by mouth (orally) to clean out your colon: Take it as told by your health care provider. Starting the day before your  procedure, you will need to drink a large amount of liquid medicine. The liquid will cause you to have many bowel movements of loose stool until your stool becomes almost clear or light green. If your skin or the opening between the buttocks (anus) gets irritated from diarrhea, you may relieve the irritation using: Wipes with medicine in them, such as adult wet wipes with aloe and vitamin E. A product to soothe skin, such as petroleum jelly. If you vomit while drinking the bowel prep: Take a break for up to 60 minutes. Begin the bowel prep again. Call your health care provider if you keep vomiting or you cannot take the bowel prep without vomiting. To clean out your colon, you may also be given: Laxative medicines. These help you have a bowel movement. Instructions for enema use. An enema is liquid medicine injected into your rectum. Medicines Ask your health care provider about: Changing or stopping your regular medicines or supplements. This is especially important if you are taking iron supplements, diabetes medicines, or blood thinners. Taking medicines such as aspirin and ibuprofen. These medicines can thin your blood. Do not take these medicines unless your health care provider tells you to take them. Taking over-the-counter medicines, vitamins, herbs, and supplements. General instructions Ask your health care provider what steps will be taken to help prevent infection. These may include washing skin with a germ-killing soap. If you will be going home right after the procedure, plan to have a responsible adult: Take you home from the hospital or clinic. You will not be allowed to drive. Care for you for the time you are told. What happens during the procedure?  An IV will be inserted into one of your veins. You will be given a medicine to make you fall asleep (general anesthetic). You will lie on your side with your knees bent. A lubricant will be put on the tube. Then the tube will  be: Inserted into your anus. Gently eased through all parts of your large intestine. Air will be sent into your colon to keep it open. This may cause some pressure or cramping. Images will be taken with the camera and will appear on a screen. A small tissue sample may be removed to be looked at under a microscope (biopsy). The tissue may be sent to a lab for testing if any signs of problems are found. If small polyps are found, they may be removed and checked for cancer cells. When the procedure is finished, the tube will be removed. The procedure may vary among health care providers and hospitals. What happens after the procedure? Your blood pressure, heart rate, breathing rate, and blood oxygen level will be monitored until you leave the hospital or clinic. You may have a small amount of blood in your stool. You may pass gas and have mild cramping or bloating in your abdomen. This is caused by the air that was used to open your colon during the exam. If you were given a sedative during the procedure, it can affect you for several hours. Do not drive or operate machinery until  your health care provider says that it is safe. It is up to you to get the results of your procedure. Ask your health care provider, or the department that is doing the procedure, when your results will be ready. Summary A colonoscopy is a procedure to look at the entire large intestine. Follow instructions from your health care provider about eating and drinking before the procedure. If you were prescribed an oral bowel prep to clean out your colon, take it as told by your health care provider. During the colonoscopy, a flexible tube with a camera on its end is inserted into the anus and then passed into all parts of the large intestine. This information is not intended to replace advice given to you by your health care provider. Make sure you discuss any questions you have with your health care provider. Document  Revised: 02/05/2022 Document Reviewed: 08/16/2020 Elsevier Patient Education  2024 [employer] Inc[employer]  Monitored Anesthesia Care Anesthesia refers to the techniques, procedures, and medicines that help a person stay safe and comfortable during surgery. Monitored anesthesia care, or sedation, is one type of anesthesia. You may have sedation if you do not need to be asleep for your procedure. Procedures that use sedation may include: Surgery to remove cataracts from your eyes. A dental procedure. A biopsy. This is when a tissue sample is removed and looked at under a microscope. You will be watched closely during your procedure. Your level of sedation or type of anesthesia may be changed to fit your needs. Tell a health care provider about: Any allergies you have. All medicines you are taking, including vitamins, herbs, eye drops, creams, and over-the-counter medicines. Any problems you or family members have had with anesthesia. Any bleeding problems you have. Any surgeries you have had. Any medical conditions or illnesses you have. This includes sleep apnea, cough, fever, or the flu. Whether you are pregnant or may be pregnant. Whether you use cigarettes, alcohol, or drugs. Any use of steroids, whether by mouth or as a cream. What are the risks? Your health care provider will talk with you about risks. These may include: Getting too much medicine (oversedation). Nausea. Allergic reactions to medicines. Trouble breathing. If this happens, a breathing tube may be used to help you breathe. It will be removed when you are awake and breathing on your own. Heart trouble. Lung trouble. Confusion that gets better with time (emergence delirium). What happens before the procedure? When to stop eating and drinking Follow instructions from your health care provider about what you may eat and drink. These may include: 8 hours before your procedure Stop eating most foods. Do not eat meat, fried foods,  or fatty foods. Eat only light foods, such as toast or crackers. All liquids are okay except energy drinks and alcohol. 6 hours before your procedure Stop eating. Drink only clear liquids, such as water, clear fruit juice, black coffee, plain tea, and sports drinks. Do not drink energy drinks or alcohol. 2 hours before your procedure Stop drinking all liquids. You may be allowed to take medicines with small sips of water. If you do not follow your health care provider's instructions, your procedure may be delayed or canceled. Medicines Ask your health care provider about: Changing or stopping your regular medicines. These include any diabetes medicines or blood thinners you take. Taking medicines such as aspirin and ibuprofen. These medicines can thin your blood. Do not take them unless your health care provider tells you to. Taking over-the-counter medicines, vitamins,  herbs, and supplements. Testing You may have an exam or testing. You may have a blood or urine sample taken. General instructions Do not use any products that contain nicotine or tobacco for at least 4 weeks before the procedure. These products include cigarettes, chewing tobacco, and vaping devices, such as e-cigarettes. If you need help quitting, ask your health care provider. If you will be going home right after the procedure, plan to have a responsible adult: Take you home from the hospital or clinic. You will not be allowed to drive. Care for you for the time you are told. What happens during the procedure?  Your blood pressure, heart rate, breathing, level of pain, and blood oxygen level will be monitored. An IV will be inserted into one of your veins. You may be given: A sedative. This helps you relax. Anesthesia. This will: Numb certain areas of your body. Make you fall asleep for surgery. You will be given medicines as needed to keep you comfortable. The more medicine you are given, the deeper your level of  sedation will be. Your level of sedation may be changed to fit your needs. There are three levels of sedation: Mild sedation. At this level, you may feel awake and relaxed. You will be able to follow directions. Moderate sedation. At this level, you will be sleepy. You may not remember the procedure. Deep sedation. At this level, you will be asleep. You will not remember the procedure. How you get the medicines will depend on your age and the procedure. They may be given as: A pill. This may be taken by mouth (orally) or inserted into the rectum. An injection. This may be into a vein or muscle. A spray through the nose. After your procedure is over, the medicine will be stopped. The procedure may vary among health care providers and hospitals. What happens after the procedure? Your blood pressure, heart rate, breathing rate, and blood oxygen level will be monitored until you leave the hospital or clinic. You may feel sleepy, clumsy, or nauseous. You may not remember what happened during or after the procedure. Sedation can affect you for several hours. Do not drive or use machinery until your health care provider says that it is safe. This information is not intended to replace advice given to you by your health care provider. Make sure you discuss any questions you have with your health care provider. Document Revised: 05/21/2021 Document Reviewed: 05/21/2021 Elsevier Patient Education  2024 [employer].

## 2022-11-07 ENCOUNTER — Encounter (HOSPITAL_COMMUNITY): Payer: Self-pay

## 2022-11-07 ENCOUNTER — Encounter (HOSPITAL_COMMUNITY)
Admission: RE | Admit: 2022-11-07 | Discharge: 2022-11-07 | Disposition: A | Discharge status: Home / Self Care | Payer: PPO | Source: Ambulatory Visit | Attending: Gastroenterology | Admitting: Gastroenterology

## 2022-11-07 ENCOUNTER — Other Ambulatory Visit: Payer: Self-pay

## 2022-11-07 DIAGNOSIS — Z01810 Encounter for preprocedural cardiovascular examination: Secondary | ICD-10-CM | POA: Diagnosis not present

## 2022-11-07 DIAGNOSIS — I10 Essential (primary) hypertension: Secondary | ICD-10-CM | POA: Diagnosis not present

## 2022-11-08 ENCOUNTER — Encounter (INDEPENDENT_AMBULATORY_CARE_PROVIDER_SITE_OTHER): Payer: PPO | Admitting: Cardiovascular Disease

## 2022-11-08 DIAGNOSIS — I2129 ST elevation (STEMI) myocardial infarction involving other sites: Secondary | ICD-10-CM

## 2022-11-08 NOTE — Telephone Encounter (Signed)
Please see the MyChart message reply(ies) for my assessment and plan.    This patient gave consent for this Medical Advice Message and is aware that it may result in a bill to [employer], as well as the possibility of receiving a bill for a co-payment or deductible. They are an established patient, but are not seeking medical advice exclusively about a problem treated during an in person or video visit in the last seven days. I did not recommend an in person or video visit within seven days of my reply.    I spent a total of 10 minutes cumulative time within 7 days through [employer].  Gisella Alwine Norton Pastel, MD

## 2022-11-12 ENCOUNTER — Ambulatory Visit (HOSPITAL_BASED_OUTPATIENT_CLINIC_OR_DEPARTMENT_OTHER): Payer: PPO | Admitting: Certified Registered"

## 2022-11-12 ENCOUNTER — Encounter (HOSPITAL_COMMUNITY)
Admission: RE | Disposition: A | Discharge status: Home / Self Care | Payer: Self-pay | Source: Home / Self Care | Attending: Gastroenterology

## 2022-11-12 ENCOUNTER — Ambulatory Visit (HOSPITAL_COMMUNITY)
Admission: RE | Admit: 2022-11-12 | Discharge: 2022-11-12 | Disposition: A | Discharge status: Home / Self Care | Payer: PPO | Attending: Gastroenterology | Admitting: Gastroenterology

## 2022-11-12 ENCOUNTER — Ambulatory Visit (HOSPITAL_COMMUNITY): Payer: PPO | Admitting: Certified Registered"

## 2022-11-12 ENCOUNTER — Encounter (HOSPITAL_COMMUNITY): Payer: Self-pay | Admitting: Gastroenterology

## 2022-11-12 DIAGNOSIS — F419 Anxiety disorder, unspecified: Secondary | ICD-10-CM | POA: Insufficient documentation

## 2022-11-12 DIAGNOSIS — D126 Benign neoplasm of colon, unspecified: Secondary | ICD-10-CM | POA: Diagnosis not present

## 2022-11-12 DIAGNOSIS — G4733 Obstructive sleep apnea (adult) (pediatric): Secondary | ICD-10-CM | POA: Insufficient documentation

## 2022-11-12 DIAGNOSIS — Z1211 Encounter for screening for malignant neoplasm of colon: Secondary | ICD-10-CM

## 2022-11-12 DIAGNOSIS — Z139 Encounter for screening, unspecified: Secondary | ICD-10-CM | POA: Diagnosis not present

## 2022-11-12 DIAGNOSIS — D123 Benign neoplasm of transverse colon: Secondary | ICD-10-CM | POA: Insufficient documentation

## 2022-11-12 DIAGNOSIS — E785 Hyperlipidemia, unspecified: Secondary | ICD-10-CM | POA: Insufficient documentation

## 2022-11-12 DIAGNOSIS — K219 Gastro-esophageal reflux disease without esophagitis: Secondary | ICD-10-CM | POA: Insufficient documentation

## 2022-11-12 DIAGNOSIS — K635 Polyp of colon: Secondary | ICD-10-CM | POA: Diagnosis not present

## 2022-11-12 DIAGNOSIS — K648 Other hemorrhoids: Secondary | ICD-10-CM | POA: Diagnosis not present

## 2022-11-12 DIAGNOSIS — K5730 Diverticulosis of large intestine without perforation or abscess without bleeding: Secondary | ICD-10-CM | POA: Insufficient documentation

## 2022-11-12 DIAGNOSIS — G4730 Sleep apnea, unspecified: Secondary | ICD-10-CM | POA: Diagnosis not present

## 2022-11-12 DIAGNOSIS — D122 Benign neoplasm of ascending colon: Secondary | ICD-10-CM | POA: Insufficient documentation

## 2022-11-12 DIAGNOSIS — Z87891 Personal history of nicotine dependence: Secondary | ICD-10-CM | POA: Insufficient documentation

## 2022-11-12 HISTORY — PX: COLONOSCOPY WITH PROPOFOL: SHX5780

## 2022-11-12 HISTORY — PX: POLYPECTOMY: SHX5525

## 2022-11-12 LAB — HM COLONOSCOPY

## 2022-11-12 SURGERY — COLONOSCOPY WITH PROPOFOL
Anesthesia: General

## 2022-11-12 MED ORDER — PROPOFOL 10 MG/ML IV BOLUS
INTRAVENOUS | Status: DC | PRN
Start: 2022-11-12 — End: 2022-11-12
  Administered 2022-11-12: 100 mg via INTRAVENOUS

## 2022-11-12 MED ORDER — LACTATED RINGERS IV SOLN: INTRAVENOUS | Status: DC | PRN

## 2022-11-12 MED ORDER — PROPOFOL 500 MG/50ML IV EMUL
INTRAVENOUS | Status: DC | PRN
  Administered 2022-11-12: 150 ug/kg/min via INTRAVENOUS

## 2022-11-12 MED ORDER — LIDOCAINE HCL (PF) 2 % IJ SOLN
INTRAMUSCULAR | Status: DC | PRN
  Administered 2022-11-12: 50 mg via INTRADERMAL

## 2022-11-12 NOTE — Anesthesia Preprocedure Evaluation (Signed)
Anesthesia Evaluation  Patient identified by MRN, date of birth, ID band Patient awake    Reviewed: Allergy & Precautions, H&P , NPO status , Patient's Chart, lab work & pertinent test results, reviewed documented beta blocker date and time   Airway Mallampati: II  TM Distance: >3 FB Neck ROM: full    Dental no notable dental hx.    Pulmonary neg pulmonary ROS, sleep apnea , former smoker   Pulmonary exam normal breath sounds clear to auscultation       Cardiovascular Exercise Tolerance: Good negative cardio ROS  Rhythm:regular Rate:Normal     Neuro/Psych   Anxiety     negative neurological ROS  negative psych ROS   GI/Hepatic negative GI ROS, Neg liver ROS,GERD  ,,  Endo/Other  negative endocrine ROS    Renal/GU Renal diseasenegative Renal ROS  negative genitourinary   Musculoskeletal   Abdominal   Peds  Hematology negative hematology ROS (+) Blood dyscrasia, anemia   Anesthesia Other Findings   Reproductive/Obstetrics negative OB ROS                             Anesthesia Physical Anesthesia Plan  ASA: 2  Anesthesia Plan: General   Post-op Pain Management:    Induction:   PONV Risk Score and Plan: Propofol infusion  Airway Management Planned:   Additional Equipment:   Intra-op Plan:   Post-operative Plan:   Informed Consent: I have reviewed the patients History and Physical, chart, labs and discussed the procedure including the risks, benefits and alternatives for the proposed anesthesia with the patient or authorized representative who has indicated his/her understanding and acceptance.     Dental Advisory Given  Plan Discussed with: CRNA  Anesthesia Plan Comments:        Anesthesia Quick Evaluation

## 2022-11-12 NOTE — Progress Notes (Signed)
700 in LR

## 2022-11-12 NOTE — Discharge Instructions (Signed)
You are being discharged to home.  Resume your previous diet.  We are waiting for your pathology results.  Your physician has recommended a repeat colonoscopy in three years for surveillance.  

## 2022-11-12 NOTE — Op Note (Signed)
[facility] Patient Name: James Norris Procedure Date: 11/12/2022 11:26 AM MRN: [id number] Date of Birth: 1950/10/30 Attending MD: Katrinka Blazing , , [id number] CSN: [id number] Age: 72 Admit Type: Outpatient Procedure:                Colonoscopy Indications:              Screening for colorectal malignant neoplasm Providers:                Katrinka Blazing, Edrick Kins, RN, Dyann Ruddle Referring MD:              Medicines:                Monitored Anesthesia Care Complications:            No immediate complications. Estimated Blood Loss:     Estimated blood loss: none. Procedure:                Pre-Anesthesia Assessment:                           - Prior to the procedure, a History and Physical                            was performed, and patient medications, allergies                            and sensitivities were reviewed. The patient's                            tolerance of previous anesthesia was reviewed.                           - The risks and benefits of the procedure and the                            sedation options and risks were discussed with the                            patient. All questions were answered and informed                            consent was obtained.                           - ASA Grade Assessment: III - A patient with severe                            systemic disease.                           After obtaining informed consent, the colonoscope                            was passed under direct vision. Throughout the                            procedure, the patient's blood  pressure, pulse, and                            oxygen saturations were monitored continuously. The                            [id number]-HQ190L ([id number]) scope was introduced through                            the anus and advanced to the the cecum, identified                            by appendiceal orifice and ileocecal valve. The                             colonoscopy was performed without difficulty. The                            patient tolerated the procedure well. The quality                            of the bowel preparation was adequate. Scope In: 11:52:11 AM Scope Out: 12:17:24 PM Scope Withdrawal Time: 0 hours 20 minutes 40 seconds  Total Procedure Duration: 0 hours 25 minutes 13 seconds  Findings:      The perianal and digital rectal examinations were normal.      Four sessile polyps were found in the transverse colon and ascending       colon. The polyps were 3 to 5 mm in size. These polyps were removed with       a cold snare. Resection and retrieval were complete.      A few small-mouthed diverticula were found in the sigmoid colon.      Non-bleeding internal hemorrhoids were found during retroflexion. The       hemorrhoids were small. Impression:               - Four 3 to 5 mm polyps in the transverse colon and                            in the ascending colon, removed with a cold snare.                            Resected and retrieved.                           - Diverticulosis in the sigmoid colon.                           - Non-bleeding internal hemorrhoids. Moderate Sedation:      Per Anesthesia Care Recommendation:           - Discharge patient to home (ambulatory).                           - Resume previous diet.                           -  Await pathology results.                           - Repeat colonoscopy in 3 years for surveillance. Procedure Code(s):        --- Professional ---                           [phone], Colonoscopy, flexible; with removal of                            tumor(s), polyp(s), or other lesion(s) by snare                            technique Diagnosis Code(s):        --- Professional ---                           Z12.11, Encounter for screening for malignant                            neoplasm of colon                           D12.3, Benign neoplasm of transverse colon (hepatic                             flexure or splenic flexure)                           D12.2, Benign neoplasm of ascending colon                           K64.8, Other hemorrhoids                           K57.30, Diverticulosis of large intestine without                            perforation or abscess without bleeding CPT copyright 2022 [employer]. All rights reserved. The codes documented in this report are preliminary and upon coder review may  be revised to meet current compliance requirements. Katrinka Blazing, MD Katrinka Blazing,  11/12/2022 12:31:48 PM This report has been signed electronically. Number of Addenda: 0

## 2022-11-12 NOTE — Transfer of Care (Signed)
Immediate Anesthesia Transfer of Care Note  Patient: James Norris  Procedure(s) Performed: COLONOSCOPY WITH PROPOFOL POLYPECTOMY  Patient Location: Short Stay  Anesthesia Type:General  Level of Consciousness: awake, alert , oriented, and patient cooperative  Airway & Oxygen Therapy: Patient Spontanous Breathing  Post-op Assessment: Report given to RN, Post -op Vital signs reviewed and stable, and Patient moving all extremities X 4  Post vital signs: Reviewed and stable  Last Vitals:  Vitals Value Taken Time  BP 101/59 11/12/22 1228  Temp 36.5 C 11/12/22 1223  Pulse 59 11/12/22 1223  Resp 13 11/12/22 1223  SpO2 98 % 11/12/22 1228    Last Pain:  Vitals:   11/12/22 1228  TempSrc:   PainSc: 0-No pain         Complications: No notable events documented.

## 2022-11-12 NOTE — H&P (Signed)
James Norris is an 72 y.o. male.   Chief Complaint: screening colonoscopy HPI: James Norris is a 72 y.o. male with past medical history of anxiety, GERD, hyperlipidemia, kidney stones and OSA, coming for screening colonoscopy.  Last colonoscopy in 2014, was normal.  The patient denies having any complaints such as melena, hematochezia, abdominal pain or distention, change in her bowel movement consistency or frequency, no changes in weight recently.  No family history of colorectal cancer.   Past Medical History:  Diagnosis Date   Anxiety    GERD (gastroesophageal reflux disease)    Hyperlipemia    Kidney stone    Nephrolithiasis 01/06/2019   Sleep apnea    cpap    Past Surgical History:  Procedure Laterality Date   BALLOON DILATION N/A 03/11/2012   Procedure: BALLOON DILATION;  Surgeon: Malissa Hippo, MD;  [facility];  Service: Endoscopy;  Laterality: N/A;   COLONOSCOPY WITH ESOPHAGOGASTRODUODENOSCOPY (EGD) N/A 03/11/2012   Procedure: COLONOSCOPY WITH ESOPHAGOGASTRODUODENOSCOPY (EGD);  Surgeon: Malissa Hippo, MD;  [facility];  Service: Endoscopy;  Laterality: N/A;  1200   CYSTOSCOPY W/ RETROGRADES Left 01/25/2019   Procedure: CYSTOSCOPY WITH LEFT RETROGRADE PYELOGRAM;  Surgeon: Malen Gauze, MD;  Location: [facility];  Service: Urology;  Laterality: Left;   CYSTOSCOPY W/ URETERAL STENT REMOVAL Left 01/25/2019   Procedure: CYSTOSCOPY WITH LEFT URETERAL STENT REMOVAL;  Surgeon: Malen Gauze, MD;  Location: [facility];  Service: Urology;  Laterality: Left;   CYSTOSCOPY/URETEROSCOPY/HOLMIUM LASER/STENT PLACEMENT Left 01/06/2019   Procedure: CYSTOSCOPY/URETEROSCOPY//STENT PLACEMENT WITH LEFT RETROGRADE PYELOGRAM;  Surgeon: Malen Gauze, MD;  Location: [facility];  Service: Urology;  Laterality: Left;   ESOPHAGEAL DILATION N/A 02/15/2015   Procedure: ESOPHAGEAL DILATION;  Surgeon: Malissa Hippo, MD;  [facility];  Service: Endoscopy;   Laterality: N/A;   ESOPHAGOGASTRODUODENOSCOPY N/A 02/15/2015   Procedure: ESOPHAGOGASTRODUODENOSCOPY (EGD);  Surgeon: Malissa Hippo, MD;  [facility];  Service: Endoscopy;  Laterality: N/A;  2:00   EXTRACORPOREAL SHOCK WAVE LITHOTRIPSY Left 01/04/2019   Procedure: EXTRACORPOREAL SHOCK WAVE LITHOTRIPSY (ESWL);  Surgeon: Sebastian Ache, MD;  Location: [facility];  Service: Urology;  Laterality: Left;   EYE SURGERY     lasik- bilaterally   TONSILLECTOMY     URETEROSCOPY Left 01/25/2019   Procedure: DIAGNOSTIC LEFT URETEROSCOPY;  Surgeon: Malen Gauze, MD;  Location: [facility];  Service: Urology;  Laterality: Left;   XI ROBOTIC ASSISTED SIMPLE PROSTATECTOMY N/A 04/05/2019   Procedure: XI ROBOTIC ASSISTED SIMPLE PROSTATECTOMY;  Surgeon: Malen Gauze, MD;  Location: [facility];  Service: Urology;  Laterality: N/A;  3 HRS    Family History  Problem Relation Age of Onset   Dementia Mother    Breast cancer Mother    Coronary artery disease Mother    Coronary artery disease Father    Hypertension Father    Diabetes type II Father    Hyperlipidemia Father    GER disease Father    Social History:  reports that he quit smoking about 16 years ago. His smoking use included cigarettes. He has been exposed to tobacco smoke. He has never used smokeless tobacco. He reports current alcohol use. He reports that he does not use drugs.  Allergies: No Known Allergies  Medications Prior to Admission  Medication Sig Dispense Refill   ALPRAZolam (XANAX) 0.5 MG tablet Take 0.5 mg by mouth 3 (three) times daily as needed for anxiety.  metoprolol tartrate (LOPRESSOR) 25 MG tablet Take 1 tablet (25 mg total) by mouth 2 (two) times daily. May take any extra tablet for heart rate of 120 or greater 180 tablet 3   Multiple Vitamin (MULTIVITAMIN) tablet Take 1 tablet by mouth daily.     pantoprazole (PROTONIX) 40 MG tablet TAKE 1 TABLET BY MOUTH ONCE DAILY BEFORE BREAKFAST 90 tablet 1   rosuvastatin  (CRESTOR) 10 MG tablet TAKE 1 TABLET BY MOUTH ONCE DAILY **NEED  OFFICE  VISIT** 90 tablet 3   sildenafil (REVATIO) 20 MG tablet Take 20 mg by mouth 3 (three) times daily.     Sodium Sulfate-Mag Sulfate-KCl (SUTAB) [phone] MG TABS As directed 24 tablet 0   tadalafil (CIALIS) 20 MG tablet TAKE 1 TABLET BY MOUTH AS NEEDED FOR ERECTILE DYSFUNCTION 10 tablet 0   apixaban (ELIQUIS) 5 MG TABS tablet Take 1 tablet (5 mg total) by mouth 2 (two) times daily. 60 tablet 5    No results found for this or any previous visit (from the past 48 hour(s)). No results found.  Review of Systems  All other systems reviewed and are negative.   Blood pressure (!) 152/72, pulse (!) 53, temperature 98.5 F (36.9 C), temperature source Oral, resp. rate 15, height 5\' 11"  (1.803 m), weight 108.9 kg, SpO2 93%. Physical Exam  GENERAL: The patient is AO x3, in no acute distress. HEENT: Head is normocephalic and atraumatic. EOMI are intact. Mouth is well hydrated and without lesions. NECK: Supple. No masses LUNGS: Clear to auscultation. No presence of rhonchi/wheezing/rales. Adequate chest expansion HEART: RRR, normal s1 and s2. ABDOMEN: Soft, nontender, no guarding, no peritoneal signs, and nondistended. BS +. No masses. EXTREMITIES: Without any cyanosis, clubbing, rash, lesions or edema. NEUROLOGIC: AOx3, no focal motor deficit. SKIN: no jaundice, no rashes   Assessment/Plan James Norris is a 72 y.o. male with past medical history of anxiety, GERD, hyperlipidemia, kidney stones and OSA, coming for screening colonoscopy.  Will proceed with colonoscopy.  Dolores Frame, MD 11/12/2022, 11:46 AM

## 2022-11-12 NOTE — Anesthesia Procedure Notes (Signed)
Date/Time: 11/12/2022 11:47 AM  Performed by: Julian Reil, CRNAPre-anesthesia Checklist: Patient identified, Emergency Drugs available, Suction available and Patient being monitored Patient Re-evaluated:Patient Re-evaluated prior to induction Oxygen Delivery Method: Nasal cannula Induction Type: IV induction Placement Confirmation: positive ETCO2 Comments: Optiflow High Flow Vansant O2 used.

## 2022-11-13 ENCOUNTER — Other Ambulatory Visit: Payer: Self-pay | Admitting: *Deleted

## 2022-11-13 DIAGNOSIS — K429 Umbilical hernia without obstruction or gangrene: Secondary | ICD-10-CM

## 2022-11-13 LAB — SURGICAL PATHOLOGY

## 2022-11-13 NOTE — Anesthesia Postprocedure Evaluation (Signed)
Anesthesia Post Note  Patient: James Norris  Procedure(s) Performed: COLONOSCOPY WITH PROPOFOL POLYPECTOMY  Patient location during evaluation: Phase II Anesthesia Type: General Level of consciousness: awake Pain management: pain level controlled Vital Signs Assessment: post-procedure vital signs reviewed and stable Respiratory status: spontaneous breathing and respiratory function stable Cardiovascular status: blood pressure returned to baseline and stable Postop Assessment: no headache and no apparent nausea or vomiting Anesthetic complications: no Comments: Late entry   No notable events documented.   Last Vitals:  Vitals:   11/12/22 1228 11/12/22 1230  BP: (!) 101/59 (!) 103/59  Pulse:    Resp:    Temp:    SpO2: 98%     Last Pain:  Vitals:   11/12/22 1228  TempSrc:   PainSc: 0-No pain                 Windell Norfolk

## 2022-11-14 ENCOUNTER — Ambulatory Visit: Payer: PPO | Attending: Nurse Practitioner | Admitting: Nurse Practitioner

## 2022-11-14 ENCOUNTER — Encounter: Payer: Self-pay | Admitting: Nurse Practitioner

## 2022-11-14 VITALS — BP 116/70 | HR 72 | Ht 72.0 in | Wt 270.8 lb

## 2022-11-14 DIAGNOSIS — I480 Paroxysmal atrial fibrillation: Secondary | ICD-10-CM

## 2022-11-14 DIAGNOSIS — I2510 Atherosclerotic heart disease of native coronary artery without angina pectoris: Secondary | ICD-10-CM | POA: Diagnosis not present

## 2022-11-14 DIAGNOSIS — I272 Pulmonary hypertension, unspecified: Secondary | ICD-10-CM | POA: Diagnosis not present

## 2022-11-14 DIAGNOSIS — E785 Hyperlipidemia, unspecified: Secondary | ICD-10-CM | POA: Diagnosis not present

## 2022-11-14 DIAGNOSIS — E669 Obesity, unspecified: Secondary | ICD-10-CM

## 2022-11-14 DIAGNOSIS — R0600 Dyspnea, unspecified: Secondary | ICD-10-CM | POA: Diagnosis not present

## 2022-11-14 DIAGNOSIS — R0609 Other forms of dyspnea: Secondary | ICD-10-CM

## 2022-11-14 MED ORDER — METOPROLOL TARTRATE 100 MG PO TABS: 100.0000 mg | ORAL_TABLET | Freq: Once | ORAL | 0 refills | Status: DC | PRN

## 2022-11-14 NOTE — Progress Notes (Unsigned)
  Cardiology Office Note:  .   Date:  11/14/2022 ID:  James Norris, DOB 02-14-1950, MRN [id number] PCP: Elfredia Nevins, MD  [facility] Providers Cardiologist:  Charlton Haws, MD Electrophysiologist:  Lewayne Bunting, MD { Click to update primary MD,subspecialty MD or APP then REFRESH:1}   History of Present Illness: .   James Norris is a 72 y.o. male with a PMH of coronary artery calcification, hyperlipidemia, moderate hiatal hernia, pulmonary hypertension, GERD, OSA, pulmonary nodules, and dyspnea, who presents today for scheduled follow-up.  Last seen by Dr. Eden Emms on August 23, 2021.   Ski, hike, not as quick as he used to be. Doing well. Attributes shob to weight because eating more.    CCTA - no allergy to contrast dye.  Metoprolol 100 mg PO 2 hours the test.   6 month     ROS: ***  Studies Reviewed: .        *** Risk Assessment/Calculations:   {Does this patient have ATRIAL FIBRILLATION?:[phone]} No BP recorded.  {Refresh Note OR Click here to enter BP  :1}***       Physical Exam:   VS:  There were no vitals taken for this visit.   Wt Readings from Last 3 Encounters:  11/12/22 240 lb (108.9 kg)  09/05/22 267 lb (121.1 kg)  03/18/22 250 lb (113.4 kg)    GEN: Well nourished, well developed in no acute distress NECK: No JVD; No carotid bruits CARDIAC: ***RRR, no murmurs, rubs, gallops RESPIRATORY:  Clear to auscultation without rales, wheezing or rhonchi  ABDOMEN: Soft, non-tender, non-distended EXTREMITIES:  No edema; No deformity   ASSESSMENT AND PLAN: .   ***    {Are you ordering a CV Procedure (e.g. stress test, cath, DCCV, TEE, etc)?   Press F2        :[id number]}  Dispo: ***  Signed, Sharlene Dory, NP

## 2022-11-14 NOTE — Patient Instructions (Addendum)
Medication Instructions:   Continue all current medications.    Labwork:  none  Testing/Procedures:    Your cardiac CT will be scheduled at one of the below locations:   [facility] [address] [place], [place] [address] [phone]  If scheduled at [facility], please arrive at the [facility] (Entrance C2) of [facility] 30 minutes prior to test start time. You can use the FREE valet parking offered at entrance C (encouraged to control the heart rate for the test)  Proceed to the [facility] Radiology Department (first floor) to check-in and test prep.  All radiology patients and guests should use entrance C2 at [facility], accessed from [facility], even though the hospital's physical address listed is [address]     There is spacious parking and easy access to the radiology department from the [facility] entrance. Please enter here and check-in with the desk attendant.   Please follow these instructions carefully (unless otherwise directed):  An IV will be required for this test and Nitroglycerin will be given.  Hold all erectile dysfunction medications at least 3 days (72 hrs) prior to test. (Ie viagra, cialis, sildenafil, tadalafil, etc)   On the Night Before the Test: Be sure to Drink plenty of water. Do not consume any caffeinated/decaffeinated beverages or chocolate 12 hours prior to your test. Do not take any antihistamines 12 hours prior to your test. Patient has no Contrast allergy   On the Day of the Test: Drink plenty of water until 1 hour prior to the test. Do not eat any food 1 hour prior to test. You may take your regular medications prior to the test.  Take metoprolol 100 Mg (Lopressor) two hours prior to test. If you take Furosemide/Hydrochlorothiazide/Spironolactone, please HOLD on the morning of the test. FEMALES- please wear underwire-free bra if  available, avoid dresses & tight clothing      After the Test: Drink plenty of water. After receiving IV contrast, you may experience a mild flushed feeling. This is normal. On occasion, you may experience a mild rash up to 24 hours after the test. This is not dangerous. If this occurs, you can take Benadryl 25 mg and increase your fluid intake. If you experience trouble breathing, this can be serious. If it is severe call 911 IMMEDIATELY. If it is mild, please call our office.   We will call to schedule your test 2-4 weeks out understanding that some insurance companies will need an authorization prior to the service being performed.   For more information and frequently asked questions, please visit our website : [email or web address]  For non-scheduling related questions, please contact the cardiac imaging nurse navigator should you have any questions/concerns: Cardiac Imaging Nurse Navigators Direct Office Dial: [phone]   For scheduling needs, including cancellations and rescheduling, please call [place], [phone].  Follow-Up:  6 months - Nishan   Any Other Special Instructions Will Be Listed Below (If Applicable).   If you need a refill on your cardiac medications before your next appointment, please call your pharmacy.

## 2022-11-15 ENCOUNTER — Encounter (INDEPENDENT_AMBULATORY_CARE_PROVIDER_SITE_OTHER): Payer: Self-pay | Admitting: *Deleted

## 2022-11-18 ENCOUNTER — Encounter (HOSPITAL_COMMUNITY): Payer: Self-pay | Admitting: Gastroenterology

## 2022-11-20 ENCOUNTER — Other Ambulatory Visit: Payer: Self-pay | Admitting: Urology

## 2022-11-21 ENCOUNTER — Encounter: Payer: Self-pay | Admitting: General Surgery

## 2022-11-21 ENCOUNTER — Other Ambulatory Visit: Payer: Self-pay

## 2022-11-21 ENCOUNTER — Ambulatory Visit: Payer: PPO | Admitting: General Surgery

## 2022-11-21 VITALS — BP 143/88 | HR 67 | Temp 98.7°F | Resp 18 | Ht 72.0 in | Wt 270.0 lb

## 2022-11-21 DIAGNOSIS — K432 Incisional hernia without obstruction or gangrene: Secondary | ICD-10-CM | POA: Diagnosis not present

## 2022-11-21 NOTE — Progress Notes (Addendum)
James Norris; [id number]; 04/10/1950   HPI Patient is a 72 year old white male who was referred to my care by [name] for evaluation and treatment of an umbilical hernia.  He had a laparoscopic prostatectomy in 2021 with the port site at the umbilicus.  Over time, he has developed increasing swelling in the umbilicus.  It is easily reducible.  It is starting to cause him discomfort.  It is made worse with straining.  No history of incarceration noted. Past Medical History:  Diagnosis Date   Anxiety    GERD (gastroesophageal reflux disease)    Hyperlipemia    Kidney stone    Nephrolithiasis 01/06/2019   Sleep apnea    cpap    Past Surgical History:  Procedure Laterality Date   BALLOON DILATION N/A 03/11/2012   Procedure: BALLOON DILATION;  Surgeon: Malissa Hippo, MD;  [facility];  Service: Endoscopy;  Laterality: N/A;   COLONOSCOPY WITH ESOPHAGOGASTRODUODENOSCOPY (EGD) N/A 03/11/2012   Procedure: COLONOSCOPY WITH ESOPHAGOGASTRODUODENOSCOPY (EGD);  Surgeon: Malissa Hippo, MD;  [facility];  Service: Endoscopy;  Laterality: N/A;  1200   COLONOSCOPY WITH PROPOFOL N/A 11/12/2022   Procedure: COLONOSCOPY WITH PROPOFOL;  Surgeon: Dolores Frame, MD;  [facility];  Service: Gastroenterology;  Laterality: N/A;  11:45AM;ASA 3   CYSTOSCOPY W/ RETROGRADES Left 01/25/2019   Procedure: CYSTOSCOPY WITH LEFT RETROGRADE PYELOGRAM;  Surgeon: Malen Gauze, MD;  [facility]: [facility];  Service: Urology;  Laterality: Left;   CYSTOSCOPY W/ URETERAL STENT REMOVAL Left 01/25/2019   Procedure: CYSTOSCOPY WITH LEFT URETERAL STENT REMOVAL;  Surgeon: Malen Gauze, MD;  [facility]: [facility];  Service: Urology;  Laterality: Left;   CYSTOSCOPY/URETEROSCOPY/HOLMIUM LASER/STENT PLACEMENT Left 01/06/2019   Procedure: CYSTOSCOPY/URETEROSCOPY//STENT PLACEMENT WITH LEFT RETROGRADE PYELOGRAM;  Surgeon: Malen Gauze, MD;  [facility]: [facility];  Service: Urology;   Laterality: Left;   ESOPHAGEAL DILATION N/A 02/15/2015   Procedure: ESOPHAGEAL DILATION;  Surgeon: Malissa Hippo, MD;  [facility];  Service: Endoscopy;  Laterality: N/A;   ESOPHAGOGASTRODUODENOSCOPY N/A 02/15/2015   Procedure: ESOPHAGOGASTRODUODENOSCOPY (EGD);  Surgeon: Malissa Hippo, MD;  [facility];  Service: Endoscopy;  Laterality: N/A;  2:00   EXTRACORPOREAL SHOCK WAVE LITHOTRIPSY Left 01/04/2019   Procedure: EXTRACORPOREAL SHOCK WAVE LITHOTRIPSY (ESWL);  Surgeon: Sebastian Ache, MD;  Location: [facility];  Service: Urology;  Laterality: Left;   EYE SURGERY     lasik- bilaterally   POLYPECTOMY  11/12/2022   Procedure: POLYPECTOMY;  Surgeon: Dolores Frame, MD;  [facility];  Service: Gastroenterology;;   TONSILLECTOMY     URETEROSCOPY Left 01/25/2019   Procedure: DIAGNOSTIC LEFT URETEROSCOPY;  Surgeon: Malen Gauze, MD;  [facility];  Service: Urology;  Laterality: Left;   XI ROBOTIC ASSISTED SIMPLE PROSTATECTOMY N/A 04/05/2019   Procedure: XI ROBOTIC ASSISTED SIMPLE PROSTATECTOMY;  Surgeon: Malen Gauze, MD;  Location[facility];  Service: Urology;  Laterality: N/A;  3 HRS    Family History  Problem Relation Age of Onset   Dementia Mother    Breast cancer Mother    Coronary artery disease Mother    Coronary artery disease Father    Hypertension Father    Diabetes type II Father    Hyperlipidemia Father    GER disease Father     Current Outpatient Medications on File Prior to Visit  Medication Sig Dispense Refill   ALPRAZolam (XANAX) 0.5 MG tablet Take 0.5 mg by mouth  3 (three) times daily as needed for anxiety.      apixaban (ELIQUIS) 5 MG TABS tablet Take 1 tablet (5 mg total) by mouth 2 (two) times daily. 60 tablet 5   metoprolol tartrate (LOPRESSOR) 25 MG tablet Take 1 tablet (25 mg total) by mouth 2 (two) times daily. May take any extra tablet for heart rate of 120 or greater 180 tablet 3   Multiple Vitamin  (MULTIVITAMIN) tablet Take 1 tablet by mouth daily.     pantoprazole (PROTONIX) 40 MG tablet TAKE 1 TABLET BY MOUTH ONCE DAILY BEFORE BREAKFAST 90 tablet 1   rosuvastatin (CRESTOR) 10 MG tablet TAKE 1 TABLET BY MOUTH ONCE DAILY **NEED  OFFICE  VISIT** 90 tablet 3   Semaglutide-Weight Management (WEGOVY) 0.25 MG/0.5ML SOAJ Inject 0.25 mg into the skin once a week.     sildenafil (REVATIO) 20 MG tablet Take 20 mg by mouth 3 (three) times daily.     tadalafil (CIALIS) 20 MG tablet TAKE 1 TABLET BY MOUTH AS NEEDED FOR ERECTILE DYSFUNCTION 10 tablet 0   No current facility-administered medications on file prior to visit.    No Known Allergies  Social History   Substance and Sexual Activity  Alcohol Use Yes   Alcohol/week: 0.0 standard drinks of alcohol   Comment: social rare    Social History   Tobacco Use  Smoking Status Former   Current packs/day: 0.00   Types: Cigarettes   Quit date: 08/27/2006   Years since quitting: 16.2   Passive exposure: Past  Smokeless Tobacco Never  Tobacco Comments   quit 7 yrs ago    Review of Systems  Constitutional: Negative.   HENT: Negative.    Eyes: Negative.   Respiratory: Negative.    Cardiovascular: Negative.   Gastrointestinal: Negative.   Genitourinary: Negative.   Musculoskeletal: Negative.   Skin: Negative.   Neurological: Negative.   Endo/Heme/Allergies: Negative.   Psychiatric/Behavioral: Negative.      Objective   Vitals:   11/21/22 0923  BP: (!) 143/88  Pulse: 67  Resp: 18  Temp: 98.7 F (37.1 C)  SpO2: 94%    Physical Exam Vitals reviewed.  Constitutional:      Appearance: Normal appearance. He is not ill-appearing.  HENT:     Head: Normocephalic and atraumatic.  Cardiovascular:     Rate and Rhythm: Normal rate and regular rhythm.     Heart sounds: Normal heart sounds. No murmur heard.    No friction rub. No gallop.  Pulmonary:     Effort: Pulmonary effort is normal. No respiratory distress.     Breath  sounds: Normal breath sounds. No stridor. No wheezing, rhonchi or rales.  Abdominal:     General: Bowel sounds are normal. There is no distension.     Palpations: Abdomen is soft. There is no mass.     Tenderness: There is no abdominal tenderness. There is no guarding or rebound.     Hernia: A hernia is present.     Comments: 3 cm easily reducible umbilical hernia below a supraumbilical surgical scar.  Skin:    General: Skin is warm and dry.  Neurological:     Mental Status: He is alert and oriented to person, place, and time.    Cardiology notes reviewed Assessment  Incisional hernia History of cardiac disease, atrial fibrillation, on Eliquis Plan  Patient is currently scheduled for cardiac CT scan on 12/09/2022.  Will await clearance from cardiology prior to scheduling a robotic assisted incisional herniorrhaphy  with mesh.  The risks and benefits of the procedure including bleeding, infection, mesh use, and the possibility of recurrence of the hernia were fully explained to the patient, who gave informed consent.  Patient will need to hold his Wegovy and Eliquis prior to the surgery.

## 2022-12-06 ENCOUNTER — Telehealth (HOSPITAL_COMMUNITY): Payer: Self-pay | Admitting: Emergency Medicine

## 2022-12-06 NOTE — Telephone Encounter (Signed)
Reaching out to patient to offer assistance regarding upcoming cardiac imaging study; pt verbalizes understanding of appt date/time, parking situation and where to check in, pre-test NPO status and medications ordered, and verified current allergies; name and call back number provided for further questions should they arise Cayne Yom RN Navigator Cardiac Imaging [facility] [phone] office [phone] cell 

## 2022-12-09 ENCOUNTER — Ambulatory Visit (HOSPITAL_COMMUNITY)
Admission: RE | Admit: 2022-12-09 | Discharge: 2022-12-09 | Disposition: A | Discharge status: Home / Self Care | Payer: PPO | Source: Ambulatory Visit | Attending: Nurse Practitioner | Admitting: Nurse Practitioner

## 2022-12-09 ENCOUNTER — Other Ambulatory Visit: Payer: Self-pay | Admitting: Cardiology

## 2022-12-09 ENCOUNTER — Ambulatory Visit (HOSPITAL_COMMUNITY)
Admission: RE | Admit: 2022-12-09 | Discharge: 2022-12-09 | Disposition: A | Discharge status: Home / Self Care | Payer: PPO | Source: Ambulatory Visit | Attending: Cardiology | Admitting: Cardiology

## 2022-12-09 DIAGNOSIS — R931 Abnormal findings on diagnostic imaging of heart and coronary circulation: Secondary | ICD-10-CM | POA: Diagnosis not present

## 2022-12-09 DIAGNOSIS — I2510 Atherosclerotic heart disease of native coronary artery without angina pectoris: Secondary | ICD-10-CM

## 2022-12-09 DIAGNOSIS — R0600 Dyspnea, unspecified: Secondary | ICD-10-CM | POA: Insufficient documentation

## 2022-12-09 DIAGNOSIS — I700 Atherosclerosis of aorta: Secondary | ICD-10-CM | POA: Diagnosis not present

## 2022-12-09 MED ORDER — NITROGLYCERIN 0.4 MG SL SUBL
SUBLINGUAL_TABLET | SUBLINGUAL | Status: AC
  Filled 2022-12-09: qty 2

## 2022-12-09 MED ORDER — NITROGLYCERIN 0.4 MG SL SUBL
0.8000 mg | SUBLINGUAL_TABLET | Freq: Once | SUBLINGUAL | Status: AC
  Administered 2022-12-09: 0.8 mg via SUBLINGUAL

## 2022-12-09 MED ORDER — IOHEXOL 350 MG/ML SOLN
100.0000 mL | Freq: Once | INTRAVENOUS | Status: AC | PRN
  Administered 2022-12-09: 100 mL via INTRAVENOUS

## 2022-12-12 ENCOUNTER — Encounter: Payer: Self-pay | Admitting: *Deleted

## 2022-12-12 ENCOUNTER — Telehealth: Payer: Self-pay | Admitting: *Deleted

## 2022-12-12 NOTE — Telephone Encounter (Signed)
   Pre-operative Risk Assessment    Patient Name: James Norris  DOB: August 18, 1950 MRN: [id number]  DATE OF LAST VISIT: 11/14/22 Sharlene Dory, NP DATE OF NEXT VISIT: NONE  I DOD NOT SEE THAT WE EVER RECEIVED A CLEARANCE REQUEST UNTIL TODAY    Request for Surgical Clearance    Procedure:   Xi ROBOTIC ASSISTED INCISIONAL HERNIA REPAIR W/MESH  Date of Surgery:  Clearance TBD                                 Surgeon:  DR. MARK JENKINS Surgeon's Group or Practice Name:  [facility] Phone number:  [phone] Fax number:  [phone]   Type of Clearance Requested:   - Medical  - Pharmacy:  Hold Apixaban (Eliquis) x 2 DAYS PRIOR   Type of Anesthesia:  General    Additional requests/questions:    Elpidio Anis   12/12/2022, 5:46 PM

## 2022-12-12 NOTE — Telephone Encounter (Signed)
Received call from patient [phone]~ telephone.   Patient was seen in office on 11/21/2022. Surgery scheduling deferred until after cardiac CT completed. Patient reports that CT has been completed and results are in EPIC.   Patient would like to proceed with surgery scheduling.  XI Robotic Assisted Incisional Hernia Repair W/ Mesh, 3 cm CPT: 49591 Dx: K43.2  Hold Eliquis x2 days Hold Wegovey x8 days  Please advise if cardiac clearance is required.

## 2022-12-13 NOTE — Telephone Encounter (Signed)
Patient with diagnosis of afib on Eliquis for anticoagulation.    Procedure:  xi robotic assisted incisional hernia repair w/mesh Date of procedure: TBD   CHA2DS2-VASc Score = 2   This indicates a 2.2% annual risk of stroke. The patient's score is based upon: CHF History: 0 HTN History: 0 Diabetes History: 0 Stroke History: 0 Vascular Disease History: 1 Age Score: 1 Gender Score: 0     CrCl 82 mL/min Platelet count 242 K    Per office protocol, patient can hold Eliquis for 2 days prior to procedure.     **This guidance is not considered finalized until pre-operative APP has relayed final recommendations.**

## 2022-12-13 NOTE — Telephone Encounter (Addendum)
     Primary Cardiologist: Charlton Haws, MD  Chart reviewed as part of pre-operative protocol coverage. Given past medical history and time since last visit, based on ACC/AHA guidelines, EHREN RENTERIA would be at acceptable risk for the planned procedure without further cardiovascular testing.   Mr. Arauz's perioperative risk of a major cardiac event is 0.9% according to the Revised Cardiac Risk Index (RCRI).  Therefore, he is at low risk for perioperative complications.   His functional capacity is excellent at 8.27 METs according to the Duke Activity Status Index (DASI).            Per office protocol, patient can hold Eliquis for 2 days prior to procedure.    I will route this recommendation to the requesting party via Epic fax function and remove from pre-op pool.  Thomasene Ripple. Fitz Matsuo NP-C     12/13/2022, 1:35 PM [facility] [address] Office [phone] Fax [phone]

## 2022-12-13 NOTE — Telephone Encounter (Signed)
James Norris,  [name] is requesting preoperative cardiac evaluation for robot-assisted hernia repair.  He was seen in clinic by you on 11/14/2022.  It looks like he had coronary CTA and FFR.  He denied chest pain.  Do feel he would be acceptable risk from a cardiac standpoint for planned upcoming procedure?  Thank you for your help.  Please direct your response to [facility] preop pool.  Thomasene Ripple. [name] NP-C     12/13/2022, 8:47 AM [facility] [address] Office [phone] Fax [phone]

## 2022-12-13 NOTE — Progress Notes (Signed)
  Mr. Chretien's perioperative risk of a major cardiac event is 0.9% according to the Revised Cardiac Risk Index (RCRI).  Therefore, he is at low risk for perioperative complications.   His functional capacity is excellent at 8.27 METs according to the Duke Activity Status Index (DASI). Recommendations: According to ACC/AHA guidelines, no further cardiovascular testing needed.  The patient may proceed to surgery at acceptable risk.   Antiplatelet and/or Anticoagulation Recommendations: Per office protocol, patient can hold Eliquis for 2 days prior to procedure.  Sharlene Dory, NP

## 2022-12-13 NOTE — Telephone Encounter (Signed)
Sent request to cardiac clearance pool.

## 2022-12-16 NOTE — Telephone Encounter (Signed)
Received cardiac clearance and approval to hold Eliquis x2 days.

## 2022-12-18 NOTE — Addendum Note (Signed)
Addended by: Phillips Odor on: 12/18/2022 12:20 PM   Modules accepted: Orders

## 2022-12-24 NOTE — H&P (Signed)
James Norris; [id number]; November 07, 1950   HPI Patient is a 72 year old white male who was referred to my care by Dr. Sherwood Norris for evaluation and treatment of an umbilical hernia.  He had a laparoscopic prostatectomy in 2021 with the port site at the umbilicus.  Over time, he has developed increasing swelling in the umbilicus.  It is easily reducible.  It is starting to cause him discomfort.  It is made worse with straining.  No history of incarceration noted. Past Medical History:  Diagnosis Date   Anxiety    GERD (gastroesophageal reflux disease)    Hyperlipemia    Kidney stone    Nephrolithiasis 01/06/2019   Sleep apnea    cpap    Past Surgical History:  Procedure Laterality Date   BALLOON DILATION N/A 03/11/2012   Procedure: BALLOON DILATION;  Surgeon: Malissa Hippo, MD;  [facility];  Service: Endoscopy;  Laterality: N/A;   COLONOSCOPY WITH ESOPHAGOGASTRODUODENOSCOPY (EGD) N/A 03/11/2012   Procedure: COLONOSCOPY WITH ESOPHAGOGASTRODUODENOSCOPY (EGD);  Surgeon: Malissa Hippo, MD;  [facility];  Service: Endoscopy;  Laterality: N/A;  1200   COLONOSCOPY WITH PROPOFOL N/A 11/12/2022   Procedure: COLONOSCOPY WITH PROPOFOL;  Surgeon: Dolores Frame, MD;  [facility];  Service: Gastroenterology;  Laterality: N/A;  11:45AM;ASA 3   CYSTOSCOPY W/ RETROGRADES Left 01/25/2019   Procedure: CYSTOSCOPY WITH LEFT RETROGRADE PYELOGRAM;  Surgeon: Malen Gauze, MD;  [facility];  Service: Urology;  Laterality: Left;   CYSTOSCOPY W/ URETERAL STENT REMOVAL Left 01/25/2019   Procedure: CYSTOSCOPY WITH LEFT URETERAL STENT REMOVAL;  Surgeon: Malen Gauze, MD;  [facility];  Service: Urology;  Laterality: Left;   CYSTOSCOPY/URETEROSCOPY/HOLMIUM LASER/STENT PLACEMENT Left 01/06/2019   Procedure: CYSTOSCOPY/URETEROSCOPY//STENT PLACEMENT WITH LEFT RETROGRADE PYELOGRAM;  Surgeon: Malen Gauze, MD;  [facility];  Service: Urology;   Laterality: Left;   ESOPHAGEAL DILATION N/A 02/15/2015   Procedure: ESOPHAGEAL DILATION;  Surgeon: Malissa Hippo, MD;  [facility];  Service: Endoscopy;  Laterality: N/A;   ESOPHAGOGASTRODUODENOSCOPY N/A 02/15/2015   Procedure: ESOPHAGOGASTRODUODENOSCOPY (EGD);  Surgeon: Malissa Hippo, MD;  [facility];  Service: Endoscopy;  Laterality: N/A;  2:00   EXTRACORPOREAL SHOCK WAVE LITHOTRIPSY Left 01/04/2019   Procedure: EXTRACORPOREAL SHOCK WAVE LITHOTRIPSY (ESWL);  Surgeon: Sebastian Ache, MD;  [facility];  Service: Urology;  Laterality: Left;   EYE SURGERY     lasik- bilaterally   POLYPECTOMY  11/12/2022   Procedure: POLYPECTOMY;  Surgeon: Dolores Frame, MD;  [facility];  Service: Gastroenterology;;   TONSILLECTOMY     URETEROSCOPY Left 01/25/2019   Procedure: DIAGNOSTIC LEFT URETEROSCOPY;  Surgeon: Malen Gauze, MD;  [facility];  Service: Urology;  Laterality: Left;   XI ROBOTIC ASSISTED SIMPLE PROSTATECTOMY N/A 04/05/2019   Procedure: XI ROBOTIC ASSISTED SIMPLE PROSTATECTOMY;  Surgeon: Malen Gauze, MD;  [facility];  Service: Urology;  Laterality: N/A;  3 HRS    Family History  Problem Relation Age of Onset   Dementia Mother    Breast cancer Mother    Coronary artery disease Mother    Coronary artery disease Father    Hypertension Father    Diabetes type II Father    Hyperlipidemia Father    GER disease Father     Current Outpatient Medications on File Prior to Visit  Medication Sig Dispense Refill   ALPRAZolam (XANAX) 0.5 MG tablet Take 0.5 mg by mouth  3 (three) times daily as needed for anxiety.      apixaban (ELIQUIS) 5 MG TABS tablet Take 1 tablet (5 mg total) by mouth 2 (two) times daily. 60 tablet 5   metoprolol tartrate (LOPRESSOR) 25 MG tablet Take 1 tablet (25 mg total) by mouth 2 (two) times daily. May take any extra tablet for heart rate of 120 or greater 180 tablet 3   Multiple Vitamin  (MULTIVITAMIN) tablet Take 1 tablet by mouth daily.     pantoprazole (PROTONIX) 40 MG tablet TAKE 1 TABLET BY MOUTH ONCE DAILY BEFORE BREAKFAST 90 tablet 1   rosuvastatin (CRESTOR) 10 MG tablet TAKE 1 TABLET BY MOUTH ONCE DAILY **NEED  OFFICE  VISIT** 90 tablet 3   Semaglutide-Weight Management (WEGOVY) 0.25 MG/0.5ML SOAJ Inject 0.25 mg into the skin once a week.     sildenafil (REVATIO) 20 MG tablet Take 20 mg by mouth 3 (three) times daily.     tadalafil (CIALIS) 20 MG tablet TAKE 1 TABLET BY MOUTH AS NEEDED FOR ERECTILE DYSFUNCTION 10 tablet 0   No current facility-administered medications on file prior to visit.    No Known Allergies  Social History   Substance and Sexual Activity  Alcohol Use Yes   Alcohol/week: 0.0 standard drinks of alcohol   Comment: social rare    Social History   Tobacco Use  Smoking Status Former   Current packs/day: 0.00   Types: Cigarettes   Quit date: 08/27/2006   Years since quitting: 16.2   Passive exposure: Past  Smokeless Tobacco Never  Tobacco Comments   quit 7 yrs ago    Review of Systems  Constitutional: Negative.   HENT: Negative.    Eyes: Negative.   Respiratory: Negative.    Cardiovascular: Negative.   Gastrointestinal: Negative.   Genitourinary: Negative.   Musculoskeletal: Negative.   Skin: Negative.   Neurological: Negative.   Endo/Heme/Allergies: Negative.   Psychiatric/Behavioral: Negative.      Objective   Vitals:   11/21/22 0923  BP: (!) 143/88  Pulse: 67  Resp: 18  Temp: 98.7 F (37.1 C)  SpO2: 94%    Physical Exam Vitals reviewed.  Constitutional:      Appearance: Normal appearance. He is not ill-appearing.  HENT:     Head: Normocephalic and atraumatic.  Cardiovascular:     Rate and Rhythm: Normal rate and regular rhythm.     Heart sounds: Normal heart sounds. No murmur heard.    No friction rub. No gallop.  Pulmonary:     Effort: Pulmonary effort is normal. No respiratory distress.     Breath  sounds: Normal breath sounds. No stridor. No wheezing, rhonchi or rales.  Abdominal:     General: Bowel sounds are normal. There is no distension.     Palpations: Abdomen is soft. There is no mass.     Tenderness: There is no abdominal tenderness. There is no guarding or rebound.     Hernia: A hernia is present.     Comments: 3 cm easily reducible umbilical hernia below a supraumbilical surgical scar.  Skin:    General: Skin is warm and dry.  Neurological:     Mental Status: He is alert and oriented to person, place, and time.    Cardiology notes reviewed Assessment  Incisional hernia History of cardiac disease, atrial fibrillation, on Eliquis Plan  Patient is currently scheduled for cardiac CT scan on 12/09/2022.  Will await clearance from cardiology prior to scheduling a robotic assisted incisional herniorrhaphy  with mesh.  The risks and benefits of the procedure including bleeding, infection, mesh use, and the possibility of recurrence of the hernia were fully explained to the patient, who gave informed consent.  Patient will need to hold his Wegovy and Eliquis prior to the surgery. Addendum: Patient has been cleared by cardiology for a robotic assisted incisional herniorrhaphy with mesh.  The patient is scheduled for 01/03/2023.

## 2022-12-26 ENCOUNTER — Encounter: Payer: Self-pay | Admitting: Nurse Practitioner

## 2022-12-30 ENCOUNTER — Encounter: Payer: Self-pay | Admitting: Cardiovascular Disease

## 2022-12-30 NOTE — Patient Instructions (Signed)
VALEK KURAMOTO  12/30/2022     @PREFPERIOPPHARMACY @   Your procedure is scheduled on  01/03/2023.   Report to [facility] at  0825  A.M.   Call this number if you have problems the morning of surgery:  [phone]  If you experience any cold or flu symptoms such as cough, fever, chills, shortness of breath, etc. between now and your scheduled surgery, please notify us at the above number.   Remember:        Your last dose of semaglutide should have been on 12/19 and your last dose of eliquis should be on 12/24.    Do not eat after midnight.    You may drink clear liquids until 0625 am on 01/03/2023.    Clear liquids allowed are:                    Water, Juice (No red color; non-citric and without pulp; diabetics please choose diet or no sugar options), Carbonated beverages (diabetics please choose diet or no sugar options), Clear Tea (No creamer, milk, or cream, including half & half and powdered creamer), Black Coffee Only (No creamer, milk or cream, including half & half and powdered creamer), and Clear Sports drink (No red color; diabetics please choose diet or no sugar options)    Take these medicines the morning of surgery with A SIP OF WATER            Alprazolam (if needed),metoprolol, pantoprazole.    Do not wear jewelry, make-up or nail polish, including gel polish,  artificial nails, or any other type of covering on natural nails (fingers and  toes).  Do not wear lotions, powders, or perfumes, or deodorant.  Do not shave 48 hours prior to surgery.  Men may shave face and neck.  Do not bring valuables to the hospital.  [facility] is not responsible for any belongings or valuables.  Contacts, dentures or bridgework may not be worn into surgery.  Leave your suitcase in the car.  After surgery it may be brought to your room.  For patients admitted to the hospital, discharge time will be determined by your treatment team.  Patients discharged the day of  surgery will not be allowed to drive home and must have someone with them for 24 hours.    Special instructions:   DO NOT smoke tobacco or vape for 24 hours before your procedure.  Please read over the following fact sheets that you were given. Anesthesia Post-op Instructions and Care and Recovery After Surgery        Laparoscopic Ventral Hernia Repair, Care After The following information offers guidance on how to care for yourself after your procedure. Your health care provider may also give you more specific instructions. If you have problems or questions, contact your health care provider. What can I expect after the procedure? After the procedure, it is common to have pain, discomfort, or soreness. Follow these instructions at home: Medicines Take over-the-counter and prescription medicines only as told by your health care provider. Ask your health care provider if the medicine prescribed to you: Requires you to avoid driving or using machinery. Can cause constipation. You may need to take these actions to prevent or treat constipation: Drink enough fluid to keep your urine pale yellow. Take over-the-counter or prescription medicines. Eat foods that are high in fiber, such as beans, whole grains, and fresh fruits and vegetables. Limit foods that are high  in fat and processed sugars, such as fried or sweet foods. Incision care  Follow instructions from your health care provider about how to take care of your incisions. Make sure you: Wash your hands with soap and water for at least 20 seconds before and after you change your bandage (dressing) or before you touch your abdomen. If soap and water are not available, use hand sanitizer. Change your dressing as told by your health care provider. Leave stitches (sutures), skin glue, or adhesive strips in place. These skin closures may need to stay in place for 2 weeks or longer. If adhesive strip edges start to loosen and curl up, you  may trim the loose edges. Do not remove adhesive strips completely unless your health care provider tells you to do that. Check your incision areas every day for signs of infection. Check for: More redness, swelling, or pain. Fluid or blood. Warmth. Pus or a bad smell. Bathing  Do not take baths, swim, or use a hot tub until your health care provider approves. Ask your health care provider if you may take showers. You may only be allowed to take sponge baths. Keep your dressing dry until your health care provider says it can be removed. Activity  Rest as told by your health care provider. Avoid sitting for a long time without moving. Get up to take short walks every 1-2 hours. This is important to improve blood flow and breathing. Ask for help if you feel weak or unsteady. Do not lift anything that is heavier than 10 lb (4.5 kg), or the limit that you are told, until your health care provider says that it is safe. If you were given a sedative during the procedure, it can affect you for several hours. Do not drive or operate machinery until your health care provider says that it is safe. Return to your normal activities as told by your health care provider. Ask your health care provider what activities are safe for you. General instructions  Hold a pillow over your abdomen when you cough or sneeze. This helps with pain. Do not use any products that contain nicotine or tobacco. These products include cigarettes, chewing tobacco, and vaping devices, such as e-cigarettes. These can delay healing after surgery. If you need help quitting, ask your health care provider. You may be asked to continue to do deep breathing exercises at home. This will help to prevent a lung infection. Keep all follow-up visits. This is important. Contact a health care provider if: You have any of these signs of infection: More redness, swelling, or pain around an incision. Fluid or blood coming from an  incision. Warmth coming from an incision. Pus or a bad smell coming from an incision. A fever or chills. You have pain that gets worse or does not get better with medicine. You have nausea or vomiting. You have a cough. You have shortness of breath. You have not had a bowel movement in 3 days. You are not able to urinate. Get help right away if you have: Severe pain in your abdomen. Persistent nausea and vomiting. Redness, warmth, or pain in your leg. Chest pain. Trouble breathing. These symptoms may represent a serious problem that is an emergency. Do not wait to see if the symptoms will go away. Get medical help right away. Call your local emergency services (911 in the [place] Do not drive yourself to the hospital. Summary After this procedure, it is common to have pain, discomfort, or soreness. Follow  instructions from your health care provider about how to take care of your incision. Check your incision area every day for signs of infection. Report any signs of infection to your health care provider. Keep all follow-up visits. This is important. This information is not intended to replace advice given to you by your health care provider. Make sure you discuss any questions you have with your health care provider. Document Revised: 08/13/2019 Document Reviewed: 08/13/2019 Elsevier Patient Education  2024 [employer] General Anesthesia, Adult, Care After The following information offers guidance on how to care for yourself after your procedure. Your health care provider may also give you more specific instructions. If you have problems or questions, contact your health care provider. What can I expect after the procedure? After the procedure, it is common for people to: Have pain or discomfort at the IV site. Have nausea or vomiting. Have a sore throat or hoarseness. Have trouble concentrating. Feel cold or chills. Feel weak, sleepy, or tired (fatigue). Have soreness and body  aches. These can affect parts of the body that were not involved in surgery. Follow these instructions at home: For the time period you were told by your health care provider:  Rest. Do not participate in activities where you could fall or become injured. Do not drive or use machinery. Do not drink alcohol. Do not take sleeping pills or medicines that cause drowsiness. Do not make important decisions or sign legal documents. Do not take care of children on your own. General instructions Drink enough fluid to keep your urine pale yellow. If you have sleep apnea, surgery and certain medicines can increase your risk for breathing problems. Follow instructions from your health care provider about wearing your sleep device: Anytime you are sleeping, including during daytime naps. While taking prescription pain medicines, sleeping medicines, or medicines that make you drowsy. Return to your normal activities as told by your health care provider. Ask your health care provider what activities are safe for you. Take over-the-counter and prescription medicines only as told by your health care provider. Do not use any products that contain nicotine or tobacco. These products include cigarettes, chewing tobacco, and vaping devices, such as e-cigarettes. These can delay incision healing after surgery. If you need help quitting, ask your health care provider. Contact a health care provider if: You have nausea or vomiting that does not get better with medicine. You vomit every time you eat or drink. You have pain that does not get better with medicine. You cannot urinate or have bloody urine. You develop a skin rash. You have a fever. Get help right away if: You have trouble breathing. You have chest pain. You vomit blood. These symptoms may be an emergency. Get help right away. Call 911. Do not wait to see if the symptoms will go away. Do not drive yourself to the hospital. Summary After the  procedure, it is common to have a sore throat, hoarseness, nausea, vomiting, or to feel weak, sleepy, or fatigue. For the time period you were told by your health care provider, do not drive or use machinery. Get help right away if you have difficulty breathing, have chest pain, or vomit blood. These symptoms may be an emergency. This information is not intended to replace advice given to you by your health care provider. Make sure you discuss any questions you have with your health care provider. Document Revised: 03/23/2021 Document Reviewed: 03/23/2021 Elsevier Patient Education  2024 [employer] Inc. How to Use Chlorhexidine  Before Surgery Chlorhexidine gluconate (CHG) is a germ-killing (antiseptic) solution that is used to clean the skin. It can get rid of the bacteria that normally live on the skin and can keep them away for about 24 hours. To clean your skin with CHG, you may be given: A CHG solution to use in the shower or as part of a sponge bath. A prepackaged cloth that contains CHG. Cleaning your skin with CHG may help lower the risk for infection: While you are staying in the intensive care unit of the hospital. If you have a vascular access, such as a central line, to provide short-term or long-term access to your veins. If you have a catheter to drain urine from your bladder. If you are on a ventilator. A ventilator is a machine that helps you breathe by moving air in and out of your lungs. After surgery. What are the risks? Risks of using CHG include: A skin reaction. Hearing loss, if CHG gets in your ears and you have a perforated eardrum. Eye injury, if CHG gets in your eyes and is not rinsed out. The CHG product catching fire. Make sure that you avoid smoking and flames after applying CHG to your skin. Do not use CHG: If you have a chlorhexidine allergy or have previously reacted to chlorhexidine. On babies younger than 33 months of age. How to use CHG solution Use CHG only  as told by your health care provider, and follow the instructions on the label. Use the full amount of CHG as directed. Usually, this is one bottle. During a shower Follow these steps when using CHG solution during a shower (unless your health care provider gives you different instructions): Start the shower. Use your normal soap and shampoo to wash your face and hair. Turn off the shower or move out of the shower stream. Pour the CHG onto a clean washcloth. Do not use any type of brush or rough-edged sponge. Starting at your neck, lather your body down to your toes. Make sure you follow these instructions: If you will be having surgery, pay special attention to the part of your body where you will be having surgery. Scrub this area for at least 1 minute. Do not use CHG on your head or face. If the solution gets into your ears or eyes, rinse them well with water. Avoid your genital area. Avoid any areas of skin that have broken skin, cuts, or scrapes. Scrub your back and under your arms. Make sure to wash skin folds. Let the lather sit on your skin for 1-2 minutes or as long as told by your health care provider. Thoroughly rinse your entire body in the shower. Make sure that all body creases and crevices are rinsed well. Dry off with a clean towel. Do not put any substances on your body afterward--such as powder, lotion, or perfume--unless you are told to do so by your health care provider. Only use lotions that are recommended by the manufacturer. Put on clean clothes or pajamas. If it is the night before your surgery, sleep in clean sheets.  During a sponge bath Follow these steps when using CHG solution during a sponge bath (unless your health care provider gives you different instructions): Use your normal soap and shampoo to wash your face and hair. Pour the CHG onto a clean washcloth. Starting at your neck, lather your body down to your toes. Make sure you follow these instructions: If  you will be having surgery, pay special attention  to the part of your body where you will be having surgery. Scrub this area for at least 1 minute. Do not use CHG on your head or face. If the solution gets into your ears or eyes, rinse them well with water. Avoid your genital area. Avoid any areas of skin that have broken skin, cuts, or scrapes. Scrub your back and under your arms. Make sure to wash skin folds. Let the lather sit on your skin for 1-2 minutes or as long as told by your health care provider. Using a different clean, wet washcloth, thoroughly rinse your entire body. Make sure that all body creases and crevices are rinsed well. Dry off with a clean towel. Do not put any substances on your body afterward--such as powder, lotion, or perfume--unless you are told to do so by your health care provider. Only use lotions that are recommended by the manufacturer. Put on clean clothes or pajamas. If it is the night before your surgery, sleep in clean sheets. How to use CHG prepackaged cloths Only use CHG cloths as told by your health care provider, and follow the instructions on the label. Use the CHG cloth on clean, dry skin. Do not use the CHG cloth on your head or face unless your health care provider tells you to. When washing with the CHG cloth: Avoid your genital area. Avoid any areas of skin that have broken skin, cuts, or scrapes. Before surgery Follow these steps when using a CHG cloth to clean before surgery (unless your health care provider gives you different instructions): Using the CHG cloth, vigorously scrub the part of your body where you will be having surgery. Scrub using a back-and-forth motion for 3 minutes. The area on your body should be completely wet with CHG when you are done scrubbing. Do not rinse. Discard the cloth and let the area air-dry. Do not put any substances on the area afterward, such as powder, lotion, or perfume. Put on clean clothes or pajamas. If it  is the night before your surgery, sleep in clean sheets.  For general bathing Follow these steps when using CHG cloths for general bathing (unless your health care provider gives you different instructions). Use a separate CHG cloth for each area of your body. Make sure you wash between any folds of skin and between your fingers and toes. Wash your body in the following order, switching to a new cloth after each step: The front of your neck, shoulders, and chest. Both of your arms, under your arms, and your hands. Your stomach and groin area, avoiding the genitals. Your right leg and foot. Your left leg and foot. The back of your neck, your back, and your buttocks. Do not rinse. Discard the cloth and let the area air-dry. Do not put any substances on your body afterward--such as powder, lotion, or perfume--unless you are told to do so by your health care provider. Only use lotions that are recommended by the manufacturer. Put on clean clothes or pajamas. Contact a health care provider if: Your skin gets irritated after scrubbing. You have questions about using your solution or cloth. You swallow any chlorhexidine. Call your local poison control center (412-591-7958 in the [place] Get help right away if: Your eyes itch badly, or they become very red or swollen. Your skin itches badly and is red or swollen. Your hearing changes. You have trouble seeing. You have swelling or tingling in your mouth or throat. You have trouble breathing. These symptoms may represent  a serious problem that is an emergency. Do not wait to see if the symptoms will go away. Get medical help right away. Call your local emergency services (911 in the [place] Do not drive yourself to the hospital. Summary Chlorhexidine gluconate (CHG) is a germ-killing (antiseptic) solution that is used to clean the skin. Cleaning your skin with CHG may help to lower your risk for infection. You may be given CHG to use for bathing. It  may be in a bottle or in a prepackaged cloth to use on your skin. Carefully follow your health care provider's instructions and the instructions on the product label. Do not use CHG if you have a chlorhexidine allergy. Contact your health care provider if your skin gets irritated after scrubbing. This information is not intended to replace advice given to you by your health care provider. Make sure you discuss any questions you have with your health care provider. Document Revised: 04/23/2021 Document Reviewed: 03/06/2020 Elsevier Patient Education  2023 [employer].

## 2023-01-02 ENCOUNTER — Encounter (HOSPITAL_COMMUNITY)
Admission: RE | Admit: 2023-01-02 | Discharge: 2023-01-02 | Disposition: A | Discharge status: Home / Self Care | Payer: PPO | Source: Ambulatory Visit | Attending: General Surgery | Admitting: General Surgery

## 2023-01-02 ENCOUNTER — Encounter (HOSPITAL_COMMUNITY): Payer: Self-pay

## 2023-01-03 ENCOUNTER — Ambulatory Visit (HOSPITAL_COMMUNITY): Payer: PPO | Admitting: Certified Registered"

## 2023-01-03 ENCOUNTER — Encounter (HOSPITAL_COMMUNITY)
Admission: RE | Disposition: A | Discharge status: Home / Self Care | Payer: Self-pay | Source: Home / Self Care | Attending: General Surgery

## 2023-01-03 ENCOUNTER — Encounter (HOSPITAL_COMMUNITY): Payer: Self-pay | Admitting: General Surgery

## 2023-01-03 ENCOUNTER — Ambulatory Visit (HOSPITAL_COMMUNITY)
Admission: RE | Admit: 2023-01-03 | Discharge: 2023-01-03 | Disposition: A | Discharge status: Home / Self Care | Payer: PPO | Attending: General Surgery | Admitting: General Surgery

## 2023-01-03 DIAGNOSIS — Z87891 Personal history of nicotine dependence: Secondary | ICD-10-CM | POA: Insufficient documentation

## 2023-01-03 DIAGNOSIS — K219 Gastro-esophageal reflux disease without esophagitis: Secondary | ICD-10-CM | POA: Diagnosis not present

## 2023-01-03 DIAGNOSIS — Z7901 Long term (current) use of anticoagulants: Secondary | ICD-10-CM | POA: Diagnosis not present

## 2023-01-03 DIAGNOSIS — I4891 Unspecified atrial fibrillation: Secondary | ICD-10-CM | POA: Diagnosis not present

## 2023-01-03 DIAGNOSIS — E039 Hypothyroidism, unspecified: Secondary | ICD-10-CM | POA: Diagnosis not present

## 2023-01-03 DIAGNOSIS — K432 Incisional hernia without obstruction or gangrene: Secondary | ICD-10-CM

## 2023-01-03 DIAGNOSIS — Z9079 Acquired absence of other genital organ(s): Secondary | ICD-10-CM | POA: Insufficient documentation

## 2023-01-03 DIAGNOSIS — F419 Anxiety disorder, unspecified: Secondary | ICD-10-CM | POA: Insufficient documentation

## 2023-01-03 HISTORY — PX: XI ROBOTIC ASSISTED VENTRAL HERNIA: SHX6789

## 2023-01-03 SURGERY — REPAIR, HERNIA, VENTRAL, ROBOT-ASSISTED
Anesthesia: General | Site: Abdomen

## 2023-01-03 MED ORDER — HYDROMORPHONE HCL 1 MG/ML IJ SOLN
INTRAMUSCULAR | Status: AC
  Filled 2023-01-03: qty 0.5

## 2023-01-03 MED ORDER — PROPOFOL 10 MG/ML IV BOLUS
INTRAVENOUS | Status: DC | PRN
  Administered 2023-01-03: 200 mg via INTRAVENOUS

## 2023-01-03 MED ORDER — HYDROMORPHONE HCL 1 MG/ML IJ SOLN
INTRAMUSCULAR | Status: DC | PRN
  Administered 2023-01-03: .5 mg via INTRAVENOUS

## 2023-01-03 MED ORDER — DEXAMETHASONE SODIUM PHOSPHATE 10 MG/ML IJ SOLN
INTRAMUSCULAR | Status: AC
  Filled 2023-01-03: qty 1

## 2023-01-03 MED ORDER — FENTANYL CITRATE (PF) 100 MCG/2ML IJ SOLN
INTRAMUSCULAR | Status: AC
  Filled 2023-01-03: qty 2

## 2023-01-03 MED ORDER — BUPIVACAINE HCL 0.25 % IJ SOLN
INTRAMUSCULAR | Status: DC | PRN
  Administered 2023-01-03: 30 mL

## 2023-01-03 MED ORDER — LACTATED RINGERS IV SOLN: INTRAVENOUS | Status: DC | PRN

## 2023-01-03 MED ORDER — ROCURONIUM BROMIDE 10 MG/ML (PF) SYRINGE
PREFILLED_SYRINGE | INTRAVENOUS | Status: AC
  Filled 2023-01-03: qty 10

## 2023-01-03 MED ORDER — EPHEDRINE SULFATE-NACL 50-0.9 MG/10ML-% IV SOSY
PREFILLED_SYRINGE | INTRAVENOUS | Status: DC | PRN
  Administered 2023-01-03: 10 mg via INTRAVENOUS

## 2023-01-03 MED ORDER — FENTANYL CITRATE (PF) 100 MCG/2ML IJ SOLN
INTRAMUSCULAR | Status: DC | PRN
  Administered 2023-01-03: 25 ug via INTRAVENOUS
  Administered 2023-01-03: 50 ug via INTRAVENOUS
  Administered 2023-01-03: 25 ug via INTRAVENOUS
  Administered 2023-01-03: 100 ug via INTRAVENOUS

## 2023-01-03 MED ORDER — KETOROLAC TROMETHAMINE 30 MG/ML IJ SOLN
INTRAMUSCULAR | Status: DC | PRN
  Administered 2023-01-03: 30 mg via INTRAVENOUS

## 2023-01-03 MED ORDER — OXYCODONE HCL 5 MG PO TABS: 5.0000 mg | ORAL_TABLET | Freq: Once | ORAL | Status: DC | PRN

## 2023-01-03 MED ORDER — SODIUM CHLORIDE 0.9% FLUSH: 10.0000 mL | Freq: Two times a day (BID) | INTRAVENOUS | Status: DC

## 2023-01-03 MED ORDER — MIDAZOLAM HCL 5 MG/5ML IJ SOLN
INTRAMUSCULAR | Status: DC | PRN
  Administered 2023-01-03: 1 mg via INTRAVENOUS

## 2023-01-03 MED ORDER — CEFAZOLIN SODIUM-DEXTROSE 2-4 GM/100ML-% IV SOLN
2.0000 g | INTRAVENOUS | Status: AC
  Administered 2023-01-03: 2 g via INTRAVENOUS
  Filled 2023-01-03: qty 100

## 2023-01-03 MED ORDER — OXYCODONE HCL 5 MG/5ML PO SOLN: 5.0000 mg | Freq: Once | ORAL | Status: DC | PRN

## 2023-01-03 MED ORDER — 0.9 % SODIUM CHLORIDE (POUR BTL) OPTIME
TOPICAL | Status: DC | PRN
  Administered 2023-01-03: 1000 mL

## 2023-01-03 MED ORDER — SUGAMMADEX SODIUM 200 MG/2ML IV SOLN
INTRAVENOUS | Status: DC | PRN
  Administered 2023-01-03: 200 mg via INTRAVENOUS

## 2023-01-03 MED ORDER — PROPOFOL 10 MG/ML IV BOLUS
INTRAVENOUS | Status: AC
  Filled 2023-01-03: qty 20

## 2023-01-03 MED ORDER — LIDOCAINE HCL (CARDIAC) PF 100 MG/5ML IV SOSY
PREFILLED_SYRINGE | INTRAVENOUS | Status: DC | PRN
  Administered 2023-01-03: 100 mg via INTRAVENOUS

## 2023-01-03 MED ORDER — DEXAMETHASONE SODIUM PHOSPHATE 10 MG/ML IJ SOLN
INTRAMUSCULAR | Status: DC | PRN
  Administered 2023-01-03: 5 mg via INTRAVENOUS

## 2023-01-03 MED ORDER — HYDROMORPHONE HCL 1 MG/ML IJ SOLN
0.2500 mg | INTRAMUSCULAR | Status: DC | PRN
  Administered 2023-01-03: 0.5 mg via INTRAVENOUS

## 2023-01-03 MED ORDER — OXYCODONE HCL 5 MG PO TABS: 5.0000 mg | ORAL_TABLET | Freq: Four times a day (QID) | ORAL | 0 refills | Status: DC | PRN

## 2023-01-03 MED ORDER — CHLORHEXIDINE GLUCONATE CLOTH 2 % EX PADS: 6.0000 | MEDICATED_PAD | Freq: Once | CUTANEOUS | Status: DC

## 2023-01-03 MED ORDER — KETOROLAC TROMETHAMINE 30 MG/ML IJ SOLN
INTRAMUSCULAR | Status: AC
  Filled 2023-01-03: qty 1

## 2023-01-03 MED ORDER — CHLORHEXIDINE GLUCONATE 0.12 % MT SOLN: 15.0000 mL | Freq: Once | OROMUCOSAL | Status: DC

## 2023-01-03 MED ORDER — ONDANSETRON HCL 4 MG/2ML IJ SOLN: 4.0000 mg | Freq: Once | INTRAMUSCULAR | Status: DC | PRN

## 2023-01-03 MED ORDER — MIDAZOLAM HCL 2 MG/2ML IJ SOLN
INTRAMUSCULAR | Status: AC
  Filled 2023-01-03: qty 2

## 2023-01-03 MED ORDER — HYDROMORPHONE HCL 1 MG/ML IJ SOLN
INTRAMUSCULAR | Status: AC
  Filled 2023-01-03: qty 1

## 2023-01-03 MED ORDER — EPHEDRINE 5 MG/ML INJ
INTRAVENOUS | Status: AC
  Filled 2023-01-03: qty 5

## 2023-01-03 MED ORDER — ONDANSETRON HCL 4 MG/2ML IJ SOLN
INTRAMUSCULAR | Status: AC
  Filled 2023-01-03: qty 2

## 2023-01-03 MED ORDER — ORAL CARE MOUTH RINSE: 15.0000 mL | Freq: Once | OROMUCOSAL | Status: DC

## 2023-01-03 MED ORDER — METHOCARBAMOL 750 MG PO TABS: 750.0000 mg | ORAL_TABLET | Freq: Three times a day (TID) | ORAL | 1 refills | Status: DC | PRN

## 2023-01-03 MED ORDER — ROCURONIUM BROMIDE 10 MG/ML (PF) SYRINGE
PREFILLED_SYRINGE | INTRAVENOUS | Status: DC | PRN
  Administered 2023-01-03: 70 mg via INTRAVENOUS

## 2023-01-03 SURGICAL SUPPLY — 40 items
CHLORAPREP W/TINT 26 (MISCELLANEOUS) ×1 IMPLANT
COVER LIGHT HANDLE STERIS (MISCELLANEOUS) ×2 IMPLANT
COVER MAYO STAND XLG (MISCELLANEOUS) ×1 IMPLANT
COVER TIP SHEARS 8 DVNC (MISCELLANEOUS) ×1 IMPLANT
DERMABOND ADVANCED .7 DNX12 (GAUZE/BANDAGES/DRESSINGS) ×1 IMPLANT
DRAPE ARM DVNC X/XI (DISPOSABLE) ×3 IMPLANT
DRAPE COLUMN DVNC XI (DISPOSABLE) ×1 IMPLANT
DRIVER NDL MEGA SUTCUT DVNCXI (INSTRUMENTS) ×1 IMPLANT
DRIVER NDLE MEGA SUTCUT DVNCXI (INSTRUMENTS) ×1
ELECT REM PT RETURN 9FT ADLT (ELECTROSURGICAL) ×1
ELECTRODE REM PT RTRN 9FT ADLT (ELECTROSURGICAL) ×1 IMPLANT
FORCEPS BPLR R/ABLATION 8 DVNC (INSTRUMENTS) ×1 IMPLANT
GLOVE BIOGEL PI IND STRL 7.0 (GLOVE) ×4 IMPLANT
GLOVE SURG SS PI 7.5 STRL IVOR (GLOVE) ×2 IMPLANT
GOWN STRL REUS W/TWL LRG LVL3 (GOWN DISPOSABLE) ×3 IMPLANT
KIT TURNOVER KIT A (KITS) ×1 IMPLANT
MESH VENTRALIGHT ST 4.5IN (Mesh General) IMPLANT
NDL HYPO 21X1.5 SAFETY (NEEDLE) ×1 IMPLANT
NDL INSUFFLATION 14GA 120MM (NEEDLE) ×1 IMPLANT
NEEDLE HYPO 21X1.5 SAFETY (NEEDLE) ×1
NEEDLE INSUFFLATION 14GA 120MM (NEEDLE) ×1
OBTURATOR OPTICAL STND 8 DVNC (TROCAR) ×1
OBTURATOR OPTICALSTD 8 DVNC (TROCAR) ×1 IMPLANT
PACK LAP CHOLE LZT030E (CUSTOM PROCEDURE TRAY) ×1 IMPLANT
PAD ARMBOARD 7.5X6 YLW CONV (MISCELLANEOUS) ×1 IMPLANT
PENCIL HANDSWITCHING (ELECTRODE) ×1 IMPLANT
POSITIONER HEAD 8X9X4 ADT (SOFTGOODS) ×1 IMPLANT
SCISSORS MNPLR CVD DVNC XI (INSTRUMENTS) ×1 IMPLANT
SEAL UNIV 5-12 XI (MISCELLANEOUS) ×3 IMPLANT
SET BASIN LINEN APH (SET/KITS/TRAYS/PACK) ×1 IMPLANT
SET TUBE SMOKE EVAC HIGH FLOW (TUBING) ×1 IMPLANT
SUT MNCRL AB 4-0 PS2 18 (SUTURE) ×2 IMPLANT
SUT STRATA 3-0 SH (SUTURE) ×2 IMPLANT
SUT STRATAFIX 0 PDS+ CT-2 23 (SUTURE) ×1
SUT V-LOC 90 ABS 3-0 VLT V-20 (SUTURE) ×2 IMPLANT
SUTURE STRATFX 0 PDS+ CT-2 23 (SUTURE) ×1 IMPLANT
SYR 30ML LL (SYRINGE) ×1 IMPLANT
TAPE TRANSPORE STRL 2 31045 (GAUZE/BANDAGES/DRESSINGS) ×2 IMPLANT
TRAY FOLEY W/BAG SLVR 16FR ST (SET/KITS/TRAYS/PACK) ×1 IMPLANT
WATER STERILE IRR 500ML POUR (IV SOLUTION) ×1 IMPLANT

## 2023-01-03 NOTE — Transfer of Care (Signed)
Immediate Anesthesia Transfer of Care Note  Patient: James Norris  Procedure(s) Performed: XI ROBOTIC ASSISTED VENTRAL HERNIA W/ MESH (Abdomen)  Patient Location: PACU  Anesthesia Type:General  Level of Consciousness: drowsy and patient cooperative  Airway & Oxygen Therapy: Patient Spontanous Breathing and Patient connected to nasal cannula oxygen  Post-op Assessment: Report given to RN and Post -op Vital signs reviewed and stable  Post vital signs: Reviewed and stable  Last Vitals:  Vitals Value Taken Time  BP 147/86 01/03/23 1225  Temp 36.5 C 01/03/23 1225  Pulse 68 01/03/23 1230  Resp 13 01/03/23 1230  SpO2 98 % 01/03/23 1230  Vitals shown include unfiled device data.  Last Pain:  Vitals:   01/03/23 1225  TempSrc:   PainSc: Asleep         Complications: No notable events documented.

## 2023-01-03 NOTE — Anesthesia Procedure Notes (Signed)
Procedure Name: Intubation Date/Time: 01/03/2023 10:45 AM  Performed by: Oletha Cruel, CRNAPre-anesthesia Checklist: Patient identified, Emergency Drugs available, Suction available and Patient being monitored Patient Re-evaluated:Patient Re-evaluated prior to induction Oxygen Delivery Method: Circle system utilized Preoxygenation: Pre-oxygenation with 100% oxygen Induction Type: IV induction Ventilation: Mask ventilation without difficulty Laryngoscope Size: Glidescope and 3 Grade View: Grade I Tube type: Oral Tube size: 7.5 mm Number of attempts: 1 Airway Equipment and Method: Video-laryngoscopy Placement Confirmation: ETT inserted through vocal cords under direct vision, positive ETCO2, CO2 detector and breath sounds checked- equal and bilateral Secured at: 23 cm Tube secured with: Tape Dental Injury: Teeth and Oropharynx as per pre-operative assessment  Comments: Atraumatic intubation. Visualized oral endotracheal tube advanced through glottic opening of the trachea using Glidescope. Lips and teeth remain in preoperative condition.

## 2023-01-03 NOTE — Anesthesia Postprocedure Evaluation (Signed)
Anesthesia Post Note  Patient: James Norris  Procedure(s) Performed: XI ROBOTIC ASSISTED VENTRAL HERNIA W/ MESH (Abdomen)  Patient location during evaluation: PACU Anesthesia Type: General Level of consciousness: awake and alert Pain management: pain level controlled Vital Signs Assessment: post-procedure vital signs reviewed and stable Respiratory status: spontaneous breathing, nonlabored ventilation, respiratory function stable and patient connected to nasal cannula oxygen Cardiovascular status: blood pressure returned to baseline and stable Postop Assessment: no apparent nausea or vomiting Anesthetic complications: no   There were no known notable events for this encounter.   Last Vitals:  Vitals:   01/03/23 1315 01/03/23 1329  BP: (!) 152/80 (!) 156/93  Pulse: 74 71  Resp: 14 17  Temp:  36.7 C  SpO2: 97% 94%    Last Pain:  Vitals:   01/03/23 1329  TempSrc: Oral  PainSc: 7                  Emmagrace Runkel L Kolby Myung

## 2023-01-03 NOTE — Anesthesia Preprocedure Evaluation (Signed)
Anesthesia Evaluation  Patient identified by MRN, date of birth, ID band Patient awake    Reviewed: Allergy & Precautions, H&P , NPO status , Patient's Chart, lab work & pertinent test results, reviewed documented beta blocker date and time   Airway Mallampati: II  TM Distance: >3 FB Neck ROM: full    Dental no notable dental hx. (+) Dental Advisory Given   Pulmonary sleep apnea , former smoker   Pulmonary exam normal breath sounds clear to auscultation       Cardiovascular Exercise Tolerance: Good hypertension, Normal cardiovascular exam Rhythm:regular Rate:Normal     Neuro/Psych   Anxiety     negative neurological ROS     GI/Hepatic Neg liver ROS,GERD  ,,  Endo/Other  Hypothyroidism    Renal/GU Renal disease  negative genitourinary   Musculoskeletal   Abdominal   Peds  Hematology  (+) Blood dyscrasia, anemia   Anesthesia Other Findings   Reproductive/Obstetrics negative OB ROS                             Anesthesia Physical Anesthesia Plan  ASA: 3  Anesthesia Plan: General   Post-op Pain Management: Dilaudid IV   Induction:   PONV Risk Score and Plan: Ondansetron, Dexamethasone and Midazolam  Airway Management Planned: Oral ETT  Additional Equipment: None  Intra-op Plan:   Post-operative Plan: Extubation in OR  Informed Consent: I have reviewed the patients History and Physical, chart, labs and discussed the procedure including the risks, benefits and alternatives for the proposed anesthesia with the patient or authorized representative who has indicated his/her understanding and acceptance.     Dental Advisory Given  Plan Discussed with: CRNA  Anesthesia Plan Comments:        Anesthesia Quick Evaluation

## 2023-01-03 NOTE — Interval H&P Note (Signed)
History and Physical Interval Note:  01/03/2023 9:10 AM  James Norris  has presented today for surgery, with the diagnosis of INCISIONAL HERNIA, 3 CM.  The various methods of treatment have been discussed with the patient and family. After consideration of risks, benefits and other options for treatment, the patient has consented to  Procedure(s): XI ROBOTIC ASSISTED VENTRAL HERNIA W/ MESH (N/A) as a surgical intervention.  The patient's history has been reviewed, patient examined, no change in status, stable for surgery.  I have reviewed the patient's chart and labs.  Questions were answered to the patient's satisfaction.     Franky Macho

## 2023-01-03 NOTE — Discharge Instructions (Addendum)
Restart Eliquis tomorrow.   Post Anesthesia Home Care Instructions  Activity: Get plenty of rest for the remainder of the day. A responsible individual must stay with you for 24 hours following the procedure.  For the next 24 hours, DO NOT: -Drive a car -Advertising copywriter -Drink alcoholic beverages -Take any medication unless instructed by your physician -Make any legal decisions or sign important papers.  Meals: Start with liquid foods such as gelatin or soup. Progress to regular foods as tolerated. Avoid greasy, spicy, heavy foods. If nausea and/or vomiting occur, drink only clear liquids until the nausea and/or vomiting subsides. Call your physician if vomiting continues.  Special Instructions/Symptoms: Your throat may feel dry or sore from the anesthesia or the breathing tube placed in your throat during surgery. If this causes discomfort, gargle with warm salt water. The discomfort should disappear within 24 hours.  If you had a scopolamine patch placed behind your ear for the management of post- operative nausea and/or vomiting:  1. The medication in the patch is effective for 72 hours, after which it should be removed.  Wrap patch in a tissue and discard in the trash. Wash hands thoroughly with soap and water. 2. You may remove the patch earlier than 72 hours if you experience unpleasant side effects which may include dry mouth, dizziness or visual disturbances. 3. Avoid touching the patch. Wash your hands with soap and water after contact with the patch.

## 2023-01-03 NOTE — Op Note (Signed)
Patient:  James Norris  DOB:  [birth year]  MRN:  [id number]   Preop Diagnosis: Incisional hernia  Postop Diagnosis: Same  Procedure: Robotic assisted laparoscopic incisional herniorrhaphy with mesh  Surgeon: Franky Macho, MD  Anes: General Endotracheal  Indications: Patient is a 72 year old white male who presents with an incisional hernia at the umbilicus.  The risks and benefits of the procedure including bleeding, infection, mesh use, and the possibility of recurrence of the hernia were fully explained to the patient, who gave informed consent.  Procedure note: The patient was placed in the supine position.  After induction of general endotracheal anesthesia, the abdomen was prepped and draped using usual sterile technique with ChloraPrep.  Surgical site confirmation was performed.  An incision was made in the left upper quadrant at Palmer's point.  A Veress needle was introduced into the abdominal cavity and confirmation of placement was done using the saline drop test.  The abdomen was then insufflated to 15 mmHg pressure.  An 8 mm trocar was introduced into the abdominal cavity under direct visualization without difficulty.  Additional 8 mm trocars were placed in the left flank and left lower quadrant regions.  The robot was then docked and targeted.  The patient had an approximately 3-1/2 to 4 cm umbilical hernia that contained fat.  The hernia sac was excised.  An 0 STRATAFIX running suture was used to close the hernia defect longitudinally.  An 11 cm round Bard Ventralight dual mesh was then placed and secured to the anterior abdominal wall circumferentially using 3-0 V-Loc running sutures.  The robot was then undocked and all air was evacuated from the abdominal cavity prior to removal of the trocars.  All wounds were irrigated with normal saline.  All wounds were injected with 0.25% Sensorcaine.  All incisions were closed using a 4-0 Monocryl subcuticular suture.  Dermabond was  applied.  All tape and needle counts were correct at the end of the procedure.  The patient was extubated in the operating room and transferred to PACU in stable condition.  Complications: None  EBL: Minimal  Specimen: None

## 2023-01-09 ENCOUNTER — Encounter: Payer: Self-pay | Admitting: General Surgery

## 2023-01-09 ENCOUNTER — Ambulatory Visit: Payer: PPO | Admitting: General Surgery

## 2023-01-09 DIAGNOSIS — Z09 Encounter for follow-up examination after completed treatment for conditions other than malignant neoplasm: Secondary | ICD-10-CM

## 2023-01-09 DIAGNOSIS — Z8719 Personal history of other diseases of the digestive system: Secondary | ICD-10-CM

## 2023-01-09 NOTE — Progress Notes (Signed)
 Subjective:     James Norris  Virtual telephone postoperative visit performed with patient.  I was in the hospital and the patient was at home.  He states he is doing very well.  He has no complaints. Objective:    There were no vitals taken for this visit.  General:  alert and cooperative       Assessment:    Doing well postoperatively.    Plan:   May increase activity as able.  Follow-up here as needed.  Total telephone time was 5 minutes.

## 2023-01-28 ENCOUNTER — Other Ambulatory Visit: Payer: Self-pay | Admitting: Cardiovascular Disease

## 2023-01-28 DIAGNOSIS — I480 Paroxysmal atrial fibrillation: Secondary | ICD-10-CM

## 2023-01-28 NOTE — Telephone Encounter (Signed)
Prescription refill request for Eliquis received. Indication: a fib Last office visit: 11/14/22 Scr: 1.03 epic 06/19/22 Age: 73 Weight: 122kg

## 2023-02-03 ENCOUNTER — Ambulatory Visit: Payer: PPO | Admitting: Urology

## 2023-02-03 VITALS — BP 121/67 | HR 76

## 2023-02-03 DIAGNOSIS — N401 Enlarged prostate with lower urinary tract symptoms: Secondary | ICD-10-CM

## 2023-02-03 DIAGNOSIS — R339 Retention of urine, unspecified: Secondary | ICD-10-CM | POA: Diagnosis not present

## 2023-02-03 DIAGNOSIS — R3915 Urgency of urination: Secondary | ICD-10-CM

## 2023-02-03 DIAGNOSIS — N5201 Erectile dysfunction due to arterial insufficiency: Secondary | ICD-10-CM | POA: Diagnosis not present

## 2023-02-03 MED ORDER — TADALAFIL 20 MG PO TABS: 20.0000 mg | ORAL_TABLET | Freq: Every day | ORAL | 11 refills | Status: DC | PRN

## 2023-02-03 NOTE — Progress Notes (Signed)
02/03/2023 1:18 PM   James Norris November 10, 1950 [id number]  Referring provider: Elfredia Nevins, MD [address] [place],  [place] [address]  Followup erectile dysfunction and urinary urgency  HPI: [name] is a 73yo here for followup for urinary urgency and erectile dysfunction. IPSS 1 QOL 2 after simple prostatectomy. He uses tadalafil prn with mixed results. Tadalafil works better than sildenafil for his erectile dysfunction.    PMH: Past Medical History:  Diagnosis Date   Anxiety    GERD (gastroesophageal reflux disease)    Hyperlipemia    Kidney stone    Nephrolithiasis 01/06/2019   Sleep apnea    cpap    Surgical History: Past Surgical History:  Procedure Laterality Date   BALLOON DILATION N/A 03/11/2012   Procedure: BALLOON DILATION;  Surgeon: Malissa Hippo, MD;  [facility];  Service: Endoscopy;  Laterality: N/A;   COLONOSCOPY WITH ESOPHAGOGASTRODUODENOSCOPY (EGD) N/A 03/11/2012   Procedure: COLONOSCOPY WITH ESOPHAGOGASTRODUODENOSCOPY (EGD);  Surgeon: Malissa Hippo, MD;  [facility];  Service: Endoscopy;  Laterality: N/A;  1200   COLONOSCOPY WITH PROPOFOL N/A 11/12/2022   Procedure: COLONOSCOPY WITH PROPOFOL;  Surgeon: Dolores Frame, MD;  [facility];  Service: Gastroenterology;  Laterality: N/A;  11:45AM;ASA 3   CYSTOSCOPY W/ RETROGRADES Left 01/25/2019   Procedure: CYSTOSCOPY WITH LEFT RETROGRADE PYELOGRAM;  Surgeon: Malen Gauze, MD;  Location: [facility];  Service: Urology;  Laterality: Left;   CYSTOSCOPY W/ URETERAL STENT REMOVAL Left 01/25/2019   Procedure: CYSTOSCOPY WITH LEFT URETERAL STENT REMOVAL;  Surgeon: Malen Gauze, MD;  Location: [facility];  Service: Urology;  Laterality: Left;   CYSTOSCOPY/URETEROSCOPY/HOLMIUM LASER/STENT PLACEMENT Left 01/06/2019   Procedure: CYSTOSCOPY/URETEROSCOPY//STENT PLACEMENT WITH LEFT RETROGRADE PYELOGRAM;  Surgeon: Malen Gauze, MD;  Location: [facility];   Service: Urology;  Laterality: Left;   ESOPHAGEAL DILATION N/A 02/15/2015   Procedure: ESOPHAGEAL DILATION;  Surgeon: Malissa Hippo, MD;  [facility];  Service: Endoscopy;  Laterality: N/A;   ESOPHAGOGASTRODUODENOSCOPY N/A 02/15/2015   Procedure: ESOPHAGOGASTRODUODENOSCOPY (EGD);  Surgeon: Malissa Hippo, MD;  [facility];  Service: Endoscopy;  Laterality: N/A;  2:00   EXTRACORPOREAL SHOCK WAVE LITHOTRIPSY Left 01/04/2019   Procedure: EXTRACORPOREAL SHOCK WAVE LITHOTRIPSY (ESWL);  Surgeon: Sebastian Ache, MD;  Location: [facility];  Service: Urology;  Laterality: Left;   EYE SURGERY     lasik- bilaterally   POLYPECTOMY  11/12/2022   Procedure: POLYPECTOMY;  Surgeon: Dolores Frame, MD;  [facility];  Service: Gastroenterology;;   TONSILLECTOMY     URETEROSCOPY Left 01/25/2019   Procedure: DIAGNOSTIC LEFT URETEROSCOPY;  Surgeon: Malen Gauze, MD;  Location: [facility];  Service: Urology;  Laterality: Left;   XI ROBOTIC ASSISTED SIMPLE PROSTATECTOMY N/A 04/05/2019   Procedure: XI ROBOTIC ASSISTED SIMPLE PROSTATECTOMY;  Surgeon: Malen Gauze, MD;  Location: [facility];  Service: Urology;  Laterality: N/A;  3 HRS   XI ROBOTIC ASSISTED VENTRAL HERNIA N/A 01/03/2023   Procedure: XI ROBOTIC ASSISTED VENTRAL HERNIA W/ MESH;  Surgeon: Franky Macho, MD;  Location: [facility];  Service: General;  Laterality: N/A;    Home Medications:  Allergies as of 02/03/2023   No Known Allergies      Medication List        Accurate as of February 03, 2023  1:18 PM. If you have any questions, ask your nurse or doctor.          STOP taking these  medications    methocarbamol 750 MG tablet Commonly known as: Robaxin-750   metoprolol tartrate 25 MG tablet Commonly known as: LOPRESSOR   oxyCODONE 5 MG immediate release tablet Commonly known as: Roxicodone       TAKE these medications    ALPRAZolam 0.5 MG tablet Commonly known as: XANAX Take 0.5 mg by  mouth 3 (three) times daily as needed for anxiety.   Eliquis 5 MG Tabs tablet Generic drug: apixaban Take 1 tablet by mouth twice daily   multivitamin with minerals Tabs tablet Take 1 tablet by mouth in the morning.   pantoprazole 40 MG tablet Commonly known as: PROTONIX TAKE 1 TABLET BY MOUTH ONCE DAILY BEFORE BREAKFAST   rosuvastatin 10 MG tablet Commonly known as: CRESTOR TAKE 1 TABLET BY MOUTH ONCE DAILY **NEED  OFFICE  VISIT**   sildenafil 20 MG tablet Commonly known as: REVATIO Take 20 mg by mouth daily as needed (erectile dysfunction.).   tadalafil 20 MG tablet Commonly known as: CIALIS TAKE 1 TABLET BY MOUTH AS NEEDED FOR ERECTILE DYSFUNCTION   Wegovy 0.25 MG/0.5ML Soaj Generic drug: Semaglutide-Weight Management Inject 0.25 mg into the skin every Friday.        Allergies: No Known Allergies  Family History: Family History  Problem Relation Age of Onset   Dementia Mother    Breast cancer Mother    Coronary artery disease Mother    Coronary artery disease Father    Hypertension Father    Diabetes type II Father    Hyperlipidemia Father    GER disease Father     Social History:  reports that he quit smoking about 16 years ago. His smoking use included cigarettes. He has been exposed to tobacco smoke. He has never used smokeless tobacco. He reports current alcohol use. He reports that he does not use drugs.  ROS: All other review of systems were reviewed and are negative except what is noted above in HPI  Physical Exam: BP 121/67   Pulse 76   Constitutional:  Alert and oriented, No acute distress. HEENT: [place] AT, moist mucus membranes.  Trachea midline, no masses. Cardiovascular: No clubbing, cyanosis, or edema. Respiratory: Normal respiratory effort, no increased work of breathing. GI: Abdomen is soft, nontender, nondistended, no abdominal masses GU: No CVA tenderness.  Lymph: No cervical or inguinal lymphadenopathy. Skin: No rashes, bruises or  suspicious lesions. Neurologic: Grossly intact, no focal deficits, moving all 4 extremities. Psychiatric: Normal mood and affect.  Laboratory Data: Lab Results  Component Value Date   WBC 6.6 06/19/2022   HGB 16.0 06/19/2022   HCT 47.6 06/19/2022   MCV 87 06/19/2022   PLT 240 06/19/2022    Lab Results  Component Value Date   CREATININE 1.03 06/19/2022    No results found for: "PSA"  No results found for: "TESTOSTERONE"  No results found for: "HGBA1C"  Urinalysis    Component Value Date/Time   COLORURINE YELLOW 01/05/2019 2056   APPEARANCEUR Clear 02/05/2022 1413   LABSPEC 1.017 01/05/2019 2056   PHURINE 5.0 01/05/2019 2056   GLUCOSEU Negative 02/05/2022 1413   HGBUR LARGE (A) 01/05/2019 2056   BILIRUBINUR Negative 02/05/2022 1413   KETONESUR 20 (A) 01/05/2019 2056   PROTEINUR Trace 02/05/2022 1413   PROTEINUR 30 (A) 01/05/2019 2056   UROBILINOGEN 0.2 05/24/2019 1504   UROBILINOGEN 0.2 03/27/2010 1655   NITRITE Negative 02/05/2022 1413   NITRITE NEGATIVE 01/05/2019 2056   LEUKOCYTESUR Negative 02/05/2022 1413   LEUKOCYTESUR SMALL (A) 01/05/2019 2056  Lab Results  Component Value Date   LABMICR Comment 02/05/2022   BACTERIA RARE (A) 01/05/2019    Pertinent Imaging:  Results for orders placed during the hospital encounter of 01/04/19  DG Abd 1 View  Narrative CLINICAL DATA:  Left flank pain.  History of left ureteral calculus.  EXAM: ABDOMEN - 1 VIEW  COMPARISON:  CT scan 01/02/2019  FINDINGS: Stable appearing position of the left ureteral calculus located adjacent to the L4 vertebral body. Stable lower pole right renal calculus.  The bowel gas pattern is unremarkable. Scattered air in stool noted in the colon. No free air.  IMPRESSION: 1. Stable mid left ureteral calculus. 2. Right lower pole renal calculus. 3. Unremarkable bowel gas pattern.   Electronically Signed By: Rudie Meyer M.D. On: 01/04/2019 06:39  No results found for  this or any previous visit.  No results found for this or any previous visit.  No results found for this or any previous visit.  Results for orders placed during the hospital encounter of 02/26/19  US RENAL  Narrative CLINICAL DATA:  Nephrolithiasis.  Gross hematuria.  EXAM: RENAL / URINARY TRACT ULTRASOUND COMPLETE  COMPARISON:  CT, 01/02/2019  FINDINGS: Right Kidney:  Renal measurements: 13.0 x 7.2 x 7.5 cm = volume: 365.3 mL. Normal parenchymal echogenicity. No masses. Shadowing echogenic stone in the lower pole, 8 mm. No hydronephrosis.  Left Kidney:  Renal measurements: 13.3 x 7.7 x 6.8 cm = volume: 364.8 mL. Normal parenchymal echogenicity. No masses. Shadowing echogenic stone in the lower pole, 10 mm. No hydronephrosis.  Bladder:  Mildly distended. There are calcifications projecting at the bladder base. No mass.  Other:  Enlarged prostate, volume calculated at 288 mL.  IMPRESSION: 1. No acute findings.  No hydronephrosis. 2. Nonobstructing stones in the lower pole of each kidney, on the right present on the prior CT, not seen on the prior CT in the left kidney. 3. Stones project at the bladder base.  No bladder mass. 4. Significant prosthetic enlargement.   Electronically Signed By: Amie Portland M.D. On: 02/26/2019 15:53  No results found for this or any previous visit.  No results found for this or any previous visit.  Results for orders placed during the hospital encounter of 01/02/19  CT Renal Stone Study  Narrative CLINICAL DATA:  Left flank pain.  EXAM: CT ABDOMEN AND PELVIS WITHOUT CONTRAST  TECHNIQUE: Multidetector CT imaging of the abdomen and pelvis was performed following the standard protocol without IV contrast.  COMPARISON:  03/27/2010  FINDINGS: Lower chest: 10 mm lobular nodule in the left lower lobe is stable compared to chest CT of 08/11/2018. This nodule measured 9 mm on the cardiac CT of 10/17/2017 and was 5 mm on  a chest CT of 02/11/2012.  Hepatobiliary: No suspicious focal abnormality within the liver parenchyma. There is no evidence for gallstones, gallbladder wall thickening, or pericholecystic fluid. No intrahepatic or extrahepatic biliary dilation.  Pancreas: No focal mass lesion. No dilatation of the main duct. No intraparenchymal cyst. No peripancreatic edema.  Spleen: No splenomegaly. No focal mass lesion.  Adrenals/Urinary Tract: No adrenal nodule or mass. 5 mm nonobstructing stone noted lower pole right kidney. No right ureteral stone. No secondary changes in the right kidney or ureter. No left renal stones although there is mild left hydronephrosis. 7 x 7 x 4 mm stone identified in the proximal left ureter (axial 46/series 2). No other left urinary stone. 2.1 cm water density lesion lower pole left kidney is  compatible with a cyst. No bladder stone.  Stomach/Bowel: Small to moderate hiatal hernia. Duodenum is normally positioned as is the ligament of Treitz. No small bowel wall thickening. No small bowel dilatation. The terminal ileum is normal. The appendix is best seen on coronal images and is unremarkable. No gross colonic mass. No colonic wall thickening. Diverticular changes are noted in the left colon without evidence of diverticulitis.  Vascular/Lymphatic: There is abdominal aortic atherosclerosis without aneurysm. There is no gastrohepatic or hepatoduodenal ligament lymphadenopathy. No retroperitoneal or mesenteric lymphadenopathy. No pelvic sidewall lymphadenopathy.  Reproductive: Prostate gland is markedly enlarged.  Other: No intraperitoneal free fluid.  Musculoskeletal: No worrisome lytic or sclerotic osseous abnormality.  IMPRESSION: 1. 7 x 7 x 4 mm stone identified in the proximal left ureter with mild left hydronephrosis. 2. 5 mm nonobstructing right renal stone. No other urinary stone disease evident on today's study. 3. 10 mm lobular nodule left lower  lobe has shown slow growth comparing back over almost 7 years to a study from 2014. Given the growth characteristics, this is probably benign. Conservatively, follow-up CT chest in 12 months could be used to reassess. 4. Small to moderate hiatal hernia. 5. Marked prostatomegaly. 6.  Aortic Atherosclerois (ICD10-170.0)   Electronically Signed By: Kennith Center M.D. On: 01/02/2019 10:53   Assessment & Plan:    1. Erectile dysfunction due to arterial insufficiency (Primary) -tadalafil 20mg  prn  2. Benign prostatic hyperplasia with incomplete bladder emptying Improved after simple prostatectomy  3. Urinary urgency improved   No follow-ups on file.  Wilkie Aye, MD  [facility]

## 2023-02-07 ENCOUNTER — Encounter (INDEPENDENT_AMBULATORY_CARE_PROVIDER_SITE_OTHER): Payer: Self-pay | Admitting: Gastroenterology

## 2023-02-07 ENCOUNTER — Encounter: Payer: Self-pay | Admitting: Urology

## 2023-02-07 NOTE — Patient Instructions (Signed)

## 2023-03-04 ENCOUNTER — Encounter (INDEPENDENT_AMBULATORY_CARE_PROVIDER_SITE_OTHER): Payer: PPO | Admitting: Ophthalmology

## 2023-03-24 ENCOUNTER — Ambulatory Visit (INDEPENDENT_AMBULATORY_CARE_PROVIDER_SITE_OTHER): Payer: PPO | Admitting: Gastroenterology

## 2023-03-31 ENCOUNTER — Ambulatory Visit (INDEPENDENT_AMBULATORY_CARE_PROVIDER_SITE_OTHER): Payer: PPO | Admitting: Gastroenterology

## 2023-03-31 ENCOUNTER — Encounter (INDEPENDENT_AMBULATORY_CARE_PROVIDER_SITE_OTHER): Payer: Self-pay | Admitting: Gastroenterology

## 2023-03-31 VITALS — BP 147/90 | HR 78 | Temp 97.1°F | Ht 70.0 in | Wt 275.0 lb

## 2023-03-31 DIAGNOSIS — Z6839 Body mass index (BMI) 39.0-39.9, adult: Secondary | ICD-10-CM

## 2023-03-31 DIAGNOSIS — K2100 Gastro-esophageal reflux disease with esophagitis, without bleeding: Secondary | ICD-10-CM

## 2023-03-31 DIAGNOSIS — K219 Gastro-esophageal reflux disease without esophagitis: Secondary | ICD-10-CM

## 2023-03-31 DIAGNOSIS — E662 Morbid (severe) obesity with alveolar hypoventilation: Secondary | ICD-10-CM | POA: Insufficient documentation

## 2023-03-31 DIAGNOSIS — E669 Obesity, unspecified: Secondary | ICD-10-CM | POA: Insufficient documentation

## 2023-03-31 MED ORDER — RYBELSUS 1.5 MG PO TABS: ORAL_TABLET | ORAL | 1 refills | Status: DC

## 2023-03-31 NOTE — Progress Notes (Signed)
 James Norris, M.D. Gastroenterology & Hepatology [facility] [address] [place], [place] [address]  Primary Care Physician: Elfredia Nevins, MD [address] [place] [address]  I will communicate my assessment and recommendations to the referring MD via EMR.  Problems: GERD   History of Present Illness: James Norris is a 73 y.o. male with past medical history of anxiety, GERD, hyperlipidemia, kidney stones and OSA, who presents for follow up of GERD.  The patient was last seen on 03/18/22. At that time, the patient was continued on pantoprazole 40 mg every day and was scheduled for colonoscopy.  Patient denies any complaints.  He is taking pantoprazole 40 mg every day and denies having any heartburn, dysphagia or odynophagia.  The patient denies having any nausea, vomiting, fever, chills, hematochezia, melena, hematemesis, abdominal distention, abdominal pain, diarrhea, jaundice, pruritus.  Has gained some weight recently, he wants to lose some weight as he is also having issues with his OSA.  Patient reports he was on Wegovy but it stopped been covered by his insurance.  Last Colonoscopy:11/12/22 - Four 3 to 5 mm polyps in the transverse colon and in the ascending colon, removed with a cold snare. Resected and retrieved. - Diverticulosis in the sigmoid colon. - Non- bleeding internal hemorrhoids.  Repeat colonoscopy in 3 years for surveillance.  Past Medical History: Past Medical History:  Diagnosis Date   Anxiety    GERD (gastroesophageal reflux disease)    Hyperlipemia    Kidney stone    Nephrolithiasis 01/06/2019   Sleep apnea    cpap    Past Surgical History: Past Surgical History:  Procedure Laterality Date   BALLOON DILATION N/A 03/11/2012   Procedure: BALLOON DILATION;  Surgeon: Malissa Hippo, MD;  [facility];  Service: Endoscopy;  Laterality: N/A;   COLONOSCOPY WITH  ESOPHAGOGASTRODUODENOSCOPY (EGD) N/A 03/11/2012   Procedure: COLONOSCOPY WITH ESOPHAGOGASTRODUODENOSCOPY (EGD);  Surgeon: Malissa Hippo, MD;  [facility];  Service: Endoscopy;  Laterality: N/A;  1200   COLONOSCOPY WITH PROPOFOL N/A 11/12/2022   Procedure: COLONOSCOPY WITH PROPOFOL;  Surgeon: Dolores Frame, MD;  [facility];  Service: Gastroenterology;  Laterality: N/A;  11:45AM;ASA 3   CYSTOSCOPY W/ RETROGRADES Left 01/25/2019   Procedure: CYSTOSCOPY WITH LEFT RETROGRADE PYELOGRAM;  Surgeon: Malen Gauze, MD;  Location: [facility];  Service: Urology;  Laterality: Left;   CYSTOSCOPY W/ URETERAL STENT REMOVAL Left 01/25/2019   Procedure: CYSTOSCOPY WITH LEFT URETERAL STENT REMOVAL;  Surgeon: Malen Gauze, MD;  [facility]: [facility];  Service: Urology;  Laterality: Left;   CYSTOSCOPY/URETEROSCOPY/HOLMIUM LASER/STENT PLACEMENT Left 01/06/2019   Procedure: CYSTOSCOPY/URETEROSCOPY//STENT PLACEMENT WITH LEFT RETROGRADE PYELOGRAM;  Surgeon: Malen Gauze, MD;  [facility]: [facility];  Service: Urology;  Laterality: Left;   ESOPHAGEAL DILATION N/A 02/15/2015   Procedure: ESOPHAGEAL DILATION;  Surgeon: Malissa Hippo, MD;  [facility];  Service: Endoscopy;  Laterality: N/A;   ESOPHAGOGASTRODUODENOSCOPY N/A 02/15/2015   Procedure: ESOPHAGOGASTRODUODENOSCOPY (EGD);  Surgeon: Malissa Hippo, MD;  [facility];  Service: Endoscopy;  Laterality: N/A;  2:00   EXTRACORPOREAL SHOCK WAVE LITHOTRIPSY Left 01/04/2019   Procedure: EXTRACORPOREAL SHOCK WAVE LITHOTRIPSY (ESWL);  Surgeon: Sebastian Ache, MD;  [facility];  Service: Urology;  Laterality: Left;   EYE SURGERY     lasik- bilaterally   POLYPECTOMY  11/12/2022   Procedure: POLYPECTOMY;  Surgeon: Dolores Frame, MD;  [facility];  Service:  Gastroenterology;;   TONSILLECTOMY     URETEROSCOPY Left 01/25/2019   Procedure: DIAGNOSTIC LEFT URETEROSCOPY;  Surgeon: Malen Gauze, MD;  [facility];  Service: Urology;  Laterality: Left;   XI ROBOTIC ASSISTED SIMPLE PROSTATECTOMY N/A 04/05/2019   Procedure: XI ROBOTIC ASSISTED SIMPLE PROSTATECTOMY;  Surgeon: Malen Gauze, MD;  [facility];  Service: Urology;  Laterality: N/A;  3 HRS   XI ROBOTIC ASSISTED VENTRAL HERNIA N/A 01/03/2023   Procedure: XI ROBOTIC ASSISTED VENTRAL HERNIA W/ MESH;  Surgeon: Franky Macho, MD;  Location: [facility];  Service: General;  Laterality: N/A;    Family History: Family History  Problem Relation Age of Onset   Dementia Mother    Breast cancer Mother    Coronary artery disease Mother    Coronary artery disease Father    Hypertension Father    Diabetes type II Father    Hyperlipidemia Father    GER disease Father     Social History: Social History   Tobacco Use  Smoking Status Former   Current packs/day: 0.00   Types: Cigarettes   Quit date: 08/27/2006   Years since quitting: 16.6   Passive exposure: Past  Smokeless Tobacco Never  Tobacco Comments   quit 7 yrs ago   Social History   Substance and Sexual Activity  Alcohol Use Yes   Alcohol/week: 0.0 standard drinks of alcohol   Comment: social rare   Social History   Substance and Sexual Activity  Drug Use No    Allergies: No Known Allergies  Medications: Current Outpatient Medications  Medication Sig Dispense Refill   ALPRAZolam (XANAX) 0.5 MG tablet Take 0.5 mg by mouth 3 (three) times daily as needed for anxiety.      apixaban (ELIQUIS) 5 MG TABS tablet Take 1 tablet by mouth twice daily (Patient taking differently: Take 5 mg by mouth daily.) 180 tablet 1   Multiple Vitamin (MULTIVITAMIN WITH MINERALS) TABS tablet Take 1 tablet by mouth in the morning.     pantoprazole (PROTONIX) 40 MG tablet TAKE 1 TABLET BY MOUTH ONCE DAILY BEFORE BREAKFAST 90 tablet 1   rosuvastatin (CRESTOR) 10 MG tablet TAKE 1 TABLET BY MOUTH ONCE DAILY **NEED  OFFICE  VISIT** 90 tablet 3   sildenafil (REVATIO)  20 MG tablet Take 20 mg by mouth daily as needed (erectile dysfunction.).     tadalafil (CIALIS) 20 MG tablet Take 1 tablet (20 mg total) by mouth daily as needed for erectile dysfunction. 30 tablet 11   Semaglutide-Weight Management (WEGOVY) 0.25 MG/0.5ML SOAJ Inject 0.25 mg into the skin every Friday. (Patient not taking: Reported on 03/31/2023)     No current facility-administered medications for this visit.    Review of Systems: GENERAL: negative for malaise, night sweats HEENT: No changes in hearing or vision, no nose bleeds or other nasal problems. NECK: Negative for lumps, goiter, pain and significant neck swelling RESPIRATORY: Negative for cough, wheezing CARDIOVASCULAR: Negative for chest pain, leg swelling, palpitations, orthopnea GI: SEE HPI MUSCULOSKELETAL: Negative for joint pain or swelling, back pain, and muscle pain. SKIN: Negative for lesions, rash PSYCH: Negative for sleep disturbance, mood disorder and recent psychosocial stressors. HEMATOLOGY Negative for prolonged bleeding, bruising easily, and swollen nodes. ENDOCRINE: Negative for cold or heat intolerance, polyuria, polydipsia and goiter. NEURO: negative for tremor, gait imbalance, syncope and seizures. The remainder of the review of systems is noncontributory.   Physical Exam: BP (!) 147/90 (BP Location: Left Arm, Patient Position: Sitting, Cuff Size: Large)  Pulse 78   Temp (!) 97.1 F (36.2 C) (Temporal)   Ht 5\' 10"  (1.778 m)   Wt 275 lb (124.7 kg)   BMI 39.46 kg/m  GENERAL: The patient is AO x3, in no acute distress. Obese. HEENT: Head is normocephalic and atraumatic. EOMI are intact. Mouth is well hydrated and without lesions. NECK: Supple. No masses LUNGS: Clear to auscultation. No presence of rhonchi/wheezing/rales. Adequate chest expansion HEART: RRR, normal s1 and s2. ABDOMEN: Soft, nontender, no guarding, no peritoneal signs, and nondistended. BS +. No masses. EXTREMITIES: Without any cyanosis,  clubbing, rash, lesions or edema. NEUROLOGIC: AOx3, no focal motor deficit. SKIN: no jaundice, no rashes  Imaging/Labs: as above  I personally reviewed and interpreted the available labs, imaging and endoscopic files.  Impression and Plan: ALLANTE WHITMIRE is a 73 y.o. male with past medical history of anxiety, GERD, hyperlipidemia, kidney stones and OSA, who presents for follow up of GERD.  GERD appears to be well-controlled with the use of pantoprazole 40 mg every day.  Will continue with the same dosage for now.  Patient is interested in weight loss measures as he has been gaining weight recently and this has affected his OSA as well.  He is trying to him change his diet as much as possible but he will benefit from pharmacological treatment as he has a BMI of 39.  Will start him on Rybelsus weekly and uptitrate as tolerated.  -Start Rybelsus 1.5 mg weekly for 4 weeks, then increase to 2 pills weekly for 4 weeks, then to 3 pills weekly for 4 weeks, then 4 pills weekly thereafter.  If presenting significant nausea, please let us know about this symptom and do not keep increasing the dose. -Continue pantoprazole 40 mg daily  All questions were answered.      James Blazing, MD Gastroenterology and Hepatology [facility]

## 2023-03-31 NOTE — Patient Instructions (Addendum)
 Start Rybelsus 1.5 mg weekly for 4 weeks, then increase to 2 pills weekly for 4 weeks, then to 3 pills weekly for 4 weeks, then 4 pills weekly thereafter.  If presenting significant nausea, please let us know about this symptom and do not keep increasing the dose. Continue pantoprazole 40 mg daily

## 2023-04-01 ENCOUNTER — Telehealth (INDEPENDENT_AMBULATORY_CARE_PROVIDER_SITE_OTHER): Payer: Self-pay

## 2023-04-01 NOTE — Telephone Encounter (Signed)
 I need clarification on Rybelsus 3 mg : This needs Prior Authorization.  Per the way you wrote the script,   Start Rybelsus 1.5 mg weekly for 4 weeks, then increase to 2 pills weekly for 4 weeks, then to 3 pills weekly for 4 weeks, then 4 pills weekly thereafter.  If presenting significant nausea, please let us know about this symptom and do not keep increasing the dose.  Is this one tablet po daily for 4 weeks, Then two po daily for 4 weeks, Then 3 tablets po daily for 4 weeks, Then 4 tablets daily for continuation?   How many pills, for how many days

## 2023-04-09 ENCOUNTER — Other Ambulatory Visit (INDEPENDENT_AMBULATORY_CARE_PROVIDER_SITE_OTHER): Payer: Self-pay | Admitting: Gastroenterology

## 2023-04-09 DIAGNOSIS — E66812 Obesity, class 2: Secondary | ICD-10-CM

## 2023-04-09 DIAGNOSIS — G4733 Obstructive sleep apnea (adult) (pediatric): Secondary | ICD-10-CM

## 2023-04-09 MED ORDER — RYBELSUS 3 MG PO TABS: 3.0000 mg | ORAL_TABLET | Freq: Every day | ORAL | 0 refills | Status: DC

## 2023-04-09 MED ORDER — RYBELSUS 7 MG PO TABS: 7.0000 mg | ORAL_TABLET | Freq: Every day | ORAL | 0 refills | Status: DC

## 2023-04-09 MED ORDER — RYBELSUS 14 MG PO TABS: 14.0000 mg | ORAL_TABLET | Freq: Every day | ORAL | 3 refills | Status: DC

## 2023-04-09 NOTE — Telephone Encounter (Signed)
 Patient has called today to follow up on his Medications. Says the pharmacy is awaiting Authorization from [place].

## 2023-04-09 NOTE — Telephone Encounter (Signed)
 Spoke to patient today.  I also re-sent the Rybelsus pills today, so he will start with 3 mg daily for 30 days, followed by 7 mg daily for 30 days and continuing with 40 mg daily if tolerated. Dx: Obesity and Obstructive Sleep Apnea

## 2023-04-09 NOTE — Telephone Encounter (Signed)
 I submitted the PA to patient's insurance plan.

## 2023-04-11 ENCOUNTER — Telehealth (INDEPENDENT_AMBULATORY_CARE_PROVIDER_SITE_OTHER): Payer: Self-pay

## 2023-04-11 NOTE — Telephone Encounter (Signed)
 Rybelsus 3 mg denied by patient insurance, due to the indication of Obesity is not approved or medically accepted. Must have a ongoing treatment for type 2 DM. Message sent to Dr. Levon Hedger about denial.

## 2023-04-11 NOTE — Telephone Encounter (Signed)
 What if we filed for sleep apnea? I also added this diagnosis when it was sent.

## 2023-04-11 NOTE — Telephone Encounter (Signed)
Yes please, thanks

## 2023-04-11 NOTE — Telephone Encounter (Addendum)
 I tried doing an reauthorization, and they will not let me go any through with another PA. Its says Denial on file. There is an appeal letter here, and I will leave this on your desk. When I initially did the PA it asked for only one Diagnose and I could not put both obesity and Sleep apnea, and since it was for weight loss I placed the diagnose that would correlate with the weight loss medication. (Forms on your desk). Thanks  Indication is DM, but I will go in and place that diagnosis and see what the insurance says.

## 2023-04-11 NOTE — Telephone Encounter (Signed)
 Rybelsus 3 mg denied by patient insurance, due to the indication of Obesity is not approved or medically accepted. Must have a ongoing treatment for type 2 DM.

## 2023-04-14 ENCOUNTER — Other Ambulatory Visit (HOSPITAL_COMMUNITY): Payer: Self-pay | Admitting: Family Medicine

## 2023-04-14 ENCOUNTER — Ambulatory Visit (HOSPITAL_COMMUNITY)
Admission: RE | Admit: 2023-04-14 | Discharge: 2023-04-14 | Disposition: A | Discharge status: Home / Self Care | Source: Ambulatory Visit | Attending: Family Medicine | Admitting: Family Medicine

## 2023-04-14 DIAGNOSIS — R52 Pain, unspecified: Secondary | ICD-10-CM | POA: Insufficient documentation

## 2023-04-14 DIAGNOSIS — M19011 Primary osteoarthritis, right shoulder: Secondary | ICD-10-CM | POA: Diagnosis not present

## 2023-04-14 DIAGNOSIS — M25512 Pain in left shoulder: Secondary | ICD-10-CM | POA: Diagnosis not present

## 2023-04-14 DIAGNOSIS — M19012 Primary osteoarthritis, left shoulder: Secondary | ICD-10-CM | POA: Diagnosis not present

## 2023-04-14 DIAGNOSIS — M25511 Pain in right shoulder: Secondary | ICD-10-CM | POA: Diagnosis not present

## 2023-04-14 DIAGNOSIS — M778 Other enthesopathies, not elsewhere classified: Secondary | ICD-10-CM | POA: Diagnosis not present

## 2023-05-02 NOTE — Telephone Encounter (Signed)
 Please let the patient know that the medication is not covered by his insurance as Rybelsus  is FDA approved only for diabetes.  This medication is not approved for obesity or sleep apnea.  The only medication that is approved for sleep apnea per se is tirzepatide (Zepbound) which is a SQ injectable.

## 2023-05-05 ENCOUNTER — Encounter (INDEPENDENT_AMBULATORY_CARE_PROVIDER_SITE_OTHER): Payer: Self-pay

## 2023-05-05 NOTE — Telephone Encounter (Signed)
 Me to Alyn Babe "[place]"      05/05/23  8:25 AM Per Dr. Sammi Crick,    the medication is not covered by his insurance as Rybelsus  is FDA approved only for diabetes.  This medication is not approved for obesity or sleep apnea.  The only medication that is approved for sleep apnea per se is tirzepatide (Zepbound) which is a SQ injectable.   Last read by Alyn Babe "Randy" at 9:24AM on 05/05/2023.

## 2023-05-12 ENCOUNTER — Ambulatory Visit: Admitting: Orthopedic Surgery

## 2023-05-12 VITALS — BP 148/91 | HR 65 | Ht 71.0 in | Wt 273.0 lb

## 2023-05-12 DIAGNOSIS — M7542 Impingement syndrome of left shoulder: Secondary | ICD-10-CM

## 2023-05-12 DIAGNOSIS — M25511 Pain in right shoulder: Secondary | ICD-10-CM

## 2023-05-12 MED ORDER — METHYLPREDNISOLONE ACETATE 40 MG/ML IJ SUSP
40.0000 mg | Freq: Once | INTRAMUSCULAR | Status: AC
  Administered 2023-05-12: 40 mg via INTRA_ARTICULAR

## 2023-05-12 NOTE — Patient Instructions (Signed)
 You have received an injection of steroids into the joint. 15% of patients will have increased pain within the 24 hours postinjection.   This is transient and will go away.   We recommend that you use ice packs on the injection site for 20 minutes every 2 hours and extra strength Tylenol 2 tablets every 8 as needed until the pain resolves.  If you continue to have pain after taking the Tylenol and using the ice please call the office for further instructions.

## 2023-05-12 NOTE — Addendum Note (Signed)
 Addended byArla Lab on: 05/12/2023 12:13 PM   Modules accepted: Orders

## 2023-05-12 NOTE — Progress Notes (Signed)
 Patient ID: James Norris, male   DOB: 01/14/1950, 73 y.o.   MRN: [id number]  SUMMARY AND PLAN:  Bilateral shoulder pain left greater than right  Left symptomatic  He has some features of trigger point but also impingement  Subacromial injection should handle the night pain  Follow-up if needed    Chief Complaint  Patient presents with   Shoulder Pain    Bilateral Left worse than right       HPI James Norris is a 73 y.o. male.  This is a 73 year old male presents with bilateral shoulder pain left greater than right.  The patient says his pain is in the medial scapula and in the top of the shoulder especially when he rolled over onto his shoulder at night it woke him up and it is a throbbing aching sensation  He has a history of a right rotator cuff repair    Treatment:  Nsaids no PT no Injection no  Current Outpatient Medications  Medication Instructions   ALPRAZolam  (XANAX ) 0.5 mg, 3 times daily PRN   Eliquis  5 mg, Oral, 2 times daily   metoprolol  tartrate (LOPRESSOR ) 25 mg, 2 times daily   Multiple Vitamin (MULTIVITAMIN WITH MINERALS) TABS tablet 1 tablet, Every morning   pantoprazole  (PROTONIX ) 40 mg, Oral, Daily before breakfast   rosuvastatin  (CRESTOR ) 10 MG tablet TAKE 1 TABLET BY MOUTH ONCE DAILY **NEED  OFFICE  VISIT**   Rybelsus  3 mg, Oral, Daily   Rybelsus  7 mg, Oral, Daily, Start taking once you have completed 3 mg dosing   Rybelsus  14 mg, Oral, Daily   sildenafil (REVATIO) 20 mg, Daily PRN   tadalafil  (CIALIS ) 20 mg, Oral, Daily PRN    No Known Allergies  Review of Systems ROS is not a feature of this and range of motion has not been affected  Past Medical History:  Diagnosis Date   Anxiety    GERD (gastroesophageal reflux disease)    Hyperlipemia    Kidney stone    Nephrolithiasis 01/06/2019   Sleep apnea    cpap    Past Surgical History:  Procedure Laterality Date   BALLOON DILATION N/A 03/11/2012   Procedure: BALLOON  DILATION;  Surgeon: Ruby Corporal, MD;  [facility];  Service: Endoscopy;  Laterality: N/A;   COLONOSCOPY WITH ESOPHAGOGASTRODUODENOSCOPY (EGD) N/A 03/11/2012   Procedure: COLONOSCOPY WITH ESOPHAGOGASTRODUODENOSCOPY (EGD);  Surgeon: Ruby Corporal, MD;  [facility];  Service: Endoscopy;  Laterality: N/A;  1200   COLONOSCOPY WITH PROPOFOL  N/A 11/12/2022   Procedure: COLONOSCOPY WITH PROPOFOL ;  Surgeon: Urban Garden, MD;  [facility];  Service: Gastroenterology;  Laterality: N/A;  11:45AM;ASA 3   CYSTOSCOPY W/ RETROGRADES Left 01/25/2019   Procedure: CYSTOSCOPY WITH LEFT RETROGRADE PYELOGRAM;  Surgeon: Marco Severs, MD;  [facility];  Service: Urology;  Laterality: Left;   CYSTOSCOPY W/ URETERAL STENT REMOVAL Left 01/25/2019   Procedure: CYSTOSCOPY WITH LEFT URETERAL STENT REMOVAL;  Surgeon: Marco Severs, MD;  [facility];  Service: Urology;  Laterality: Left;   CYSTOSCOPY/URETEROSCOPY/HOLMIUM LASER/STENT PLACEMENT Left 01/06/2019   Procedure: CYSTOSCOPY/URETEROSCOPY//STENT PLACEMENT WITH LEFT RETROGRADE PYELOGRAM;  Surgeon: Marco Severs, MD;  [facility];  Service: Urology;  Laterality: Left;   ESOPHAGEAL DILATION N/A 02/15/2015   Procedure: ESOPHAGEAL DILATION;  Surgeon: Ruby Corporal, MD;  [facility];  Service: Endoscopy;  Laterality: N/A;   ESOPHAGOGASTRODUODENOSCOPY N/A 02/15/2015   Procedure: ESOPHAGOGASTRODUODENOSCOPY (EGD);  Surgeon:  Ruby Corporal, MD;  [facility];  Service: Endoscopy;  Laterality: N/A;  2:00   EXTRACORPOREAL SHOCK WAVE LITHOTRIPSY Left 01/04/2019   Procedure: EXTRACORPOREAL SHOCK WAVE LITHOTRIPSY (ESWL);  Surgeon: Osborn Blaze, MD;  [facility];  Service: Urology;  Laterality: Left;   EYE SURGERY     lasik- bilaterally   POLYPECTOMY  11/12/2022   Procedure: POLYPECTOMY;  Surgeon: Urban Garden, MD;  [facility];  Service:  Gastroenterology;;   TONSILLECTOMY     URETEROSCOPY Left 01/25/2019   Procedure: DIAGNOSTIC LEFT URETEROSCOPY;  Surgeon: Marco Severs, MD;  [facility]: [facility];  Service: Urology;  Laterality: Left;   XI ROBOTIC ASSISTED SIMPLE PROSTATECTOMY N/A 04/05/2019   Procedure: XI ROBOTIC ASSISTED SIMPLE PROSTATECTOMY;  Surgeon: Marco Severs, MD;  [facility];  Service: Urology;  Laterality: N/A;  3 HRS   XI ROBOTIC ASSISTED VENTRAL HERNIA N/A 01/03/2023   Procedure: XI ROBOTIC ASSISTED VENTRAL HERNIA W/ MESH;  Surgeon: Alanda Allegra, MD;  [facility]: [facility];  Service: General;  Laterality: N/A;    Family History  Problem Relation Age of Onset   Dementia Mother    Breast cancer Mother    Coronary artery disease Mother    Coronary artery disease Father    Hypertension Father    Diabetes type II Father    Hyperlipidemia Father    GER disease Father      Social History   Tobacco Use   Smoking status: Former    Current packs/day: 0.00    Types: Cigarettes    Quit date: 08/27/2006    Years since quitting: 16.7    Passive exposure: Past   Smokeless tobacco: Never   Tobacco comments:    quit 7 yrs ago  Vaping Use   Vaping status: Never Used  Substance Use Topics   Alcohol use: Yes    Alcohol/week: 0.0 standard drinks of alcohol    Comment: social rare   Drug use: No    No Known Allergies   Current Meds  Medication Sig   metoprolol  tartrate (LOPRESSOR ) 25 MG tablet Take 25 mg by mouth 2 (two) times daily.       Physical Exam BP (!) 148/91   Pulse 65   Ht 5\' 11"  (1.803 m)   Wt 273 lb (123.8 kg)   BMI 38.08 kg/m   Ambulatory status normal with no assistive devices hide body weight  GENERAL : APPEARANCE IS NORMAL GROOMING IS GOOD  NORMAL MOOD AND AFFECT  AWAKE ALERT AND ORIENTED X 3   Left SHOULDER  TENDERNESS to border of scapula pain with shoulder flexion at 110 degrees ROM normal flexion normal external rotation normal abduction internal rotation to  L4 STABLE ANTERIORLY POSTERIORLY AND INFERIORLY SKIN CLEAN     PROVOCATIVE TESTS  DROP ARM TEST normal PAINFUL ARC 90-1 10 EMPTY CAN -JOBST TEST negative EXTERNAL ROTATION LAG TEST negative LIFT OFF TEST negative if BELLYPRESS TEST  ON THE OTHER SIDE THE RIGHT SHOULDER HAS NORMAL SKIN, NO ROM DEFICITS, EXCELLENT STABILITY, AND NORMAL 5/5 MMT STRENGTH   MEDICAL DECISION MAKING  A.  Encounter Diagnosis  Name Primary?   Impingement syndrome of left shoulder Yes    B. DATA ANALYSED:  Again imaging [facility] and lateral both shoulders  IMAGING: Independent interpretation of images:   Imaging #1 right shoulder I see a large subacromial spur but no real arthritis except for the [facility] joint  Imaging #2 left shoulder similar large subacromial spur  no real arthritis except in the [facility] joint    Orders: No orders  Outside records reviewed: None of level  C. MANAGEMENT nonop  Procedure note the subacromial injection shoulder left   Verbal consent was obtained to inject the  Left   Shoulder  Timeout was completed to confirm the injection site is a subacromial space of the  left  shoulder  Medication used Depo-Medrol  40 mg and lidocaine  1% 3 cc  Anesthesia was provided by ethyl chloride  The injection was performed in the left  posterior subacromial space. After pinning the skin with alcohol and anesthetized the skin with ethyl chloride the subacromial space was injected using a 20-gauge needle. There were no complications  Sterile dressing was applied.          No orders of the defined types were placed in this encounter.

## 2023-05-12 NOTE — Progress Notes (Signed)
  Intake history:  BP (!) 148/91   Pulse 65   Ht 5\' 11"  (1.803 m)   Wt 273 lb (123.8 kg)   BMI 38.08 kg/m  Body mass index is 38.08 kg/m.    WHAT ARE WE SEEING YOU FOR TODAY?   bilateral shoulder  How James Norris has this bothered you? (DOI?DOS?WS?)  6  week(s) ago  Anticoag.  Yes  Diabetes No  Heart disease Yes AFIB  Hypertension Yes  SMOKING HX No  Kidney disease No  Any ALLERGIES ______________________________________________   Treatment:  Have you taken:  Tylenol  Yes- has also tried icy hot and a medication he had previously can't remember the name  Advil Yes  Had PT No  Had injection No  Other  _________________________

## 2023-06-09 ENCOUNTER — Ambulatory Visit: Attending: Cardiovascular Disease | Admitting: Cardiovascular Disease

## 2023-06-09 VITALS — BP 138/70 | HR 65 | Ht 71.0 in | Wt 269.0 lb

## 2023-06-09 DIAGNOSIS — I2510 Atherosclerotic heart disease of native coronary artery without angina pectoris: Secondary | ICD-10-CM | POA: Diagnosis not present

## 2023-06-09 DIAGNOSIS — R931 Abnormal findings on diagnostic imaging of heart and coronary circulation: Secondary | ICD-10-CM

## 2023-06-09 DIAGNOSIS — E782 Mixed hyperlipidemia: Secondary | ICD-10-CM

## 2023-06-09 DIAGNOSIS — I4891 Unspecified atrial fibrillation: Secondary | ICD-10-CM | POA: Diagnosis not present

## 2023-06-09 DIAGNOSIS — E669 Obesity, unspecified: Secondary | ICD-10-CM

## 2023-06-09 DIAGNOSIS — Z01812 Encounter for preprocedural laboratory examination: Secondary | ICD-10-CM | POA: Diagnosis not present

## 2023-06-09 NOTE — Patient Instructions (Signed)
 Medication Instructions:   Hold Eliquis  2 days prior to Heart Cath   *If you need a refill on your cardiac medications before your next appointment, please call your pharmacy*  Lab Work: Your physician recommends that you return for lab work at [facility]. ( CBC, BMET)   If you have labs (blood work) drawn today and your tests are completely normal, you will receive your results only by: MyChart Message (if you have MyChart) OR A paper copy in the mail If you have any lab test that is abnormal or we need to change your treatment, we will call you to review the results.  Testing/Procedures: Your physician has requested that you have a cardiac catheterization. Cardiac catheterization is used to diagnose and/or treat various heart conditions. Doctors may recommend this procedure for a number of different reasons. The most common reason is to evaluate chest pain. Chest pain can be a symptom of coronary artery disease (CAD), and cardiac catheterization can show whether plaque is narrowing or blocking your heart's arteries. This procedure is also used to evaluate the valves, as well as measure the blood flow and oxygen levels in different parts of your heart. For further information please visit [email or web address] Please follow instruction sheet, as given.   Follow-Up: At [facility], you and your health needs are our priority.  As part of our continuing mission to provide you with exceptional heart care, our providers are all part of one team.  This team includes your primary Cardiologist (physician) and Advanced Practice Providers or APPs (Physician Assistants and Nurse Practitioners) who all work together to provide you with the care you need, when you need it.  Your next appointment:   2 -4 week(s)  Provider:   You may see Janelle Mediate, MD or one of the following Advanced Practice Providers on your designated Care Team:   Woodfin Hays, PA-C  [place], [place] Theotis Flake,  [place]     We recommend signing up for the patient portal called "MyChart".  Sign up information is provided on this After Visit Summary.  MyChart is used to connect with patients for Virtual Visits (Telemedicine).  Patients are able to view lab/test results, encounter notes, upcoming appointments, etc.  Non-urgent messages can be sent to your provider as well.   To learn more about what you can do with MyChart, go to [email or web address].   Other Instructions Thank you for choosing [facility]!       [facility] A DEPT OF [place] [address] [place] [address] Dept: [phone] Loc: [phone]  James Norris  06/09/2023  You are scheduled for a Cardiac Catheterization on Tuesday, June 10 with Dr. Antoinette Batman.  1. Please arrive at the [facility] (Main Entrance A) at [facility]: [address] [place], [place] [address] at 5:30 AM (This time is 2 hour(s) before your procedure to ensure your preparation).   Free valet parking service is available. You will check in at ADMITTING. The support person will be asked to wait in the waiting room.  It is OK to have someone drop you off and come back when you are ready to be discharged.    Special note: Every effort is made to have your procedure done on time. Please understand that emergencies sometimes delay scheduled procedures.  2. Diet: Do not eat solid foods after midnight.  The patient may have clear liquids until 5am upon  the day of the procedure.  3. Labs: You will need to have blood drawn on Friday, June 6 at [facility]. You do not need to be fasting.  4. Medication instructions in preparation for your procedure:   Contrast Allergy: No  Stop taking Eliquis  (Apixiban) on Saturday, June 6.  On the morning of your procedure, take your Aspirin 81 mg and any morning medicines NOT listed above.  You may use sips of water .  5. Plan to  go home the same day, you will only stay overnight if medically necessary. 6. Bring a current list of your medications and current insurance cards. 7. You MUST have a responsible person to drive you home. 8. Someone MUST be with you the first 24 hours after you arrive home or your discharge will be delayed. 9. Please wear clothes that are easy to get on and off and wear slip-on shoes.  Thank you for allowing us  to care for you!   -- [facility]

## 2023-06-09 NOTE — H&P (View-Only) (Signed)
 CARDIOLOGY CONSULT NOTE       Patient ID: James Norris MRN: [id number] DOB/AGE: 74-06-1950 73 y.o.   Primary Physician: Kathyleen Parkins, MD Primary Cardiologist: Ellakate Gonsalves/Taylor   HPI:  73 y.o. last seen by me 08/23/21 history of Coronary artery calcification on CT, Hyperlipidemia, lung nodules, GERD, OSA, HLD, moderate hiatal hernia, pulmonary hypertension, history of prostate surgery.    He stopped smoking 8 years ago but smoked 3 ppd prior to this    Cardiac CTA 10/17/17 calcium  score 221 no obstructive CAD 1-24%  in vessels  Myovue 12/24/19 with no ischemia EF 55-60% TTE  03/2021 EF 60-65% normal RV no pulmonary HTN mild LAE No signs of pulmonary HTN  Medicare screening no AAA 03/03/20    CTA 04/08/19 no PE moderate hiatal hernia 10 mm LLL nodule stable since 2019 PET negative at that time    Taking statin 3x/week with LDL 88 Dyspnea with fatigue and exercise intolerance COVID positive 02/03/20   Since then followed by PA and Carolynne Citron for PAF. CHADVASC 2 for vascular dx and age Rx with eliquis  and lopressor  Been in NSR   Updated cardiac CTA done 12/19/22 reviewed calcium  score 382 , 62 nd percentile  Total plaque volume 811 mm3  50-69% LCX dx FFR was positive in LCX at 0.85 mid and 0.61 distal Was not having chest pain   Had hernia repair with Dr Larrie Po 01/03/23 cleared by PA  Had left  shoulder injections with Dr Phyllis Breeze 05/12/23 Prior   Has exertional dyspnea and feels poorly at altitude in [place]  ? With more PAF and Chest tightness  Shared Decision Making/Informed Consent The risks [stroke (1 in 1000), death (1 in 1000), kidney failure [usually temporary] (1 in 500), bleeding (1 in 200), allergic reaction [possibly serious] (1 in 200)], benefits (diagnostic support and management of coronary artery disease) and alternatives of a cardiac catheterization were discussed in detail with [name] and she is willing to proceed.   ROS All other systems reviewed and negative  except as noted above  Past Medical History:  Diagnosis Date   Anxiety    GERD (gastroesophageal reflux disease)    Hyperlipemia    Kidney stone    Nephrolithiasis 01/06/2019   Sleep apnea    cpap    Family History  Problem Relation Age of Onset   Dementia Mother    Breast cancer Mother    Coronary artery disease Mother    Coronary artery disease Father    Hypertension Father    Diabetes type II Father    Hyperlipidemia Father    GER disease Father     Social History   Socioeconomic History   Marital status: Married    Spouse name: Not on file   Number of children: Not on file   Years of education: Not on file   Highest education level: Not on file  Occupational History   Not on file  Tobacco Use   Smoking status: Former    Current packs/day: 0.00    Types: Cigarettes    Quit date: 08/27/2006    Years since quitting: 16.7    Passive exposure: Past   Smokeless tobacco: Never   Tobacco comments:    quit 7 yrs ago  Vaping Use   Vaping status: Never Used  Substance and Sexual Activity   Alcohol use: Yes    Alcohol/week: 0.0 standard drinks of alcohol    Comment: social rare   Drug use: No   Sexual activity: Yes  Other Topics Concern   Not on file  Social History Narrative   Not on file   Social Drivers of Health   Financial Resource Strain: Not on file  Food Insecurity: No Food Insecurity (08/31/2021)   Hunger Vital Sign    Worried About Running Out of Food in the Last Year: Never true    Ran Out of Food in the Last Year: Never true  Transportation Needs: No Transportation Needs (08/31/2021)   PRAPARE - Administrator, Civil Service (Medical): No    Lack of Transportation (Non-Medical): No  Physical Activity: Not on file  Stress: Not on file  Social Connections: Unknown (05/21/2021)   Received from [facility]   Social Network    Social Network: Not on file  Intimate Partner Violence: Unknown (04/12/2021)   Received from  [facility], [facility]   HITS    Physically Hurt: Not on file    Insult or Talk Down To: Not on file    Threaten Physical Harm: Not on file    Scream or Curse: Not on file    Past Surgical History:  Procedure Laterality Date   BALLOON DILATION N/A 03/11/2012   Procedure: BALLOON DILATION;  Surgeon: Ruby Corporal, MD;  [facility];  Service: Endoscopy;  Laterality: N/A;   COLONOSCOPY WITH ESOPHAGOGASTRODUODENOSCOPY (EGD) N/A 03/11/2012   Procedure: COLONOSCOPY WITH ESOPHAGOGASTRODUODENOSCOPY (EGD);  Surgeon: Ruby Corporal, MD;  [facility];  Service: Endoscopy;  Laterality: N/A;  1200   COLONOSCOPY WITH PROPOFOL  N/A 11/12/2022   Procedure: COLONOSCOPY WITH PROPOFOL ;  Surgeon: Urban Garden, MD;  [facility];  Service: Gastroenterology;  Laterality: N/A;  11:45AM;ASA 3   CYSTOSCOPY W/ RETROGRADES Left 01/25/2019   Procedure: CYSTOSCOPY WITH LEFT RETROGRADE PYELOGRAM;  Surgeon: Marco Severs, MD;  Location: [facility];  Service: Urology;  Laterality: Left;   CYSTOSCOPY W/ URETERAL STENT REMOVAL Left 01/25/2019   Procedure: CYSTOSCOPY WITH LEFT URETERAL STENT REMOVAL;  Surgeon: Marco Severs, MD;  Location: [facility];  Service: Urology;  Laterality: Left;   CYSTOSCOPY/URETEROSCOPY/HOLMIUM LASER/STENT PLACEMENT Left 01/06/2019   Procedure: CYSTOSCOPY/URETEROSCOPY//STENT PLACEMENT WITH LEFT RETROGRADE PYELOGRAM;  Surgeon: Marco Severs, MD;  Location: [facility];  Service: Urology;  Laterality: Left;   ESOPHAGEAL DILATION N/A 02/15/2015   Procedure: ESOPHAGEAL DILATION;  Surgeon: Ruby Corporal, MD;  [facility];  Service: Endoscopy;  Laterality: N/A;   ESOPHAGOGASTRODUODENOSCOPY N/A 02/15/2015   Procedure: ESOPHAGOGASTRODUODENOSCOPY (EGD);  Surgeon: Ruby Corporal, MD;  [facility];  Service: Endoscopy;  Laterality: N/A;  2:00   EXTRACORPOREAL SHOCK WAVE LITHOTRIPSY Left 01/04/2019   Procedure: EXTRACORPOREAL SHOCK  WAVE LITHOTRIPSY (ESWL);  Surgeon: Osborn Blaze, MD;  Location: [facility];  Service: Urology;  Laterality: Left;   EYE SURGERY     lasik- bilaterally   POLYPECTOMY  11/12/2022   Procedure: POLYPECTOMY;  Surgeon: Urban Garden, MD;  [facility];  Service: Gastroenterology;;   TONSILLECTOMY     URETEROSCOPY Left 01/25/2019   Procedure: DIAGNOSTIC LEFT URETEROSCOPY;  Surgeon: Marco Severs, MD;  Location: [facility];  Service: Urology;  Laterality: Left;   XI ROBOTIC ASSISTED SIMPLE PROSTATECTOMY N/A 04/05/2019   Procedure: XI ROBOTIC ASSISTED SIMPLE PROSTATECTOMY;  Surgeon: Marco Severs, MD;  Location: [facility];  Service: Urology;  Laterality: N/A;  3 HRS   XI ROBOTIC ASSISTED VENTRAL HERNIA N/A 01/03/2023   Procedure: XI ROBOTIC ASSISTED VENTRAL HERNIA W/  MESH;  Surgeon: Alanda Allegra, MD;  Location: [facility];  Service: General;  Laterality: N/A;      Current Outpatient Medications:    ALPRAZolam  (XANAX ) 0.5 MG tablet, Take 0.5 mg by mouth 3 (three) times daily as needed for anxiety. , Disp: , Rfl:    apixaban  (ELIQUIS ) 5 MG TABS tablet, Take 1 tablet by mouth twice daily (Patient taking differently: Take 5 mg by mouth daily.), Disp: 180 tablet, Rfl: 1   metoprolol  tartrate (LOPRESSOR ) 25 MG tablet, Take 25 mg by mouth 2 (two) times daily., Disp: , Rfl:    Multiple Vitamin (MULTIVITAMIN WITH MINERALS) TABS tablet, Take 1 tablet by mouth in the morning., Disp: , Rfl:    pantoprazole  (PROTONIX ) 40 MG tablet, TAKE 1 TABLET BY MOUTH ONCE DAILY BEFORE BREAKFAST, Disp: 90 tablet, Rfl: 1   rosuvastatin  (CRESTOR ) 10 MG tablet, TAKE 1 TABLET BY MOUTH ONCE DAILY **NEED  OFFICE  VISIT**, Disp: 90 tablet, Rfl: 3   Semaglutide  (RYBELSUS ) 14 MG TABS, Take 1 tablet (14 mg total) by mouth daily., Disp: 90 tablet, Rfl: 3   Semaglutide  (RYBELSUS ) 3 MG TABS, Take 1 tablet (3 mg total) by mouth daily., Disp: 30 tablet, Rfl: 0   Semaglutide  (RYBELSUS ) 7 MG TABS, Take 1 tablet (7 mg total)  by mouth daily. Start taking once you have completed 3 mg dosing, Disp: 30 tablet, Rfl: 0   sildenafil (REVATIO) 20 MG tablet, Take 20 mg by mouth daily as needed (erectile dysfunction.)., Disp: , Rfl:    tadalafil  (CIALIS ) 20 MG tablet, Take 1 tablet (20 mg total) by mouth daily as needed for erectile dysfunction., Disp: 30 tablet, Rfl: 11    Physical Exam: There were no vitals taken for this visit.    Obese male Lungs clear No murmur Abdomen post robotic hernia repair ubilical Palpable pedal pulses Trace edema   Labs:   Lab Results  Component Value Date   WBC 6.6 06/19/2022   HGB 16.0 06/19/2022   HCT 47.6 06/19/2022   MCV 87 06/19/2022   PLT 240 06/19/2022      Radiology: No results found.  EKG: SR rate 60 poor R wave progression 11/07/22    ASSESSMENT AND PLAN:   CAD: no angina. Cardiac CTA with high plaque volume and positive FFR 12/09/22 Prior myovue 2021 with ? Apical infarct and mild peri infarct ischemia Given symptoms at altitude and dyspnea favor diagnostic cath. Will arrange next week Labs this week Hold eliquis  day before and morning of  PAF:  NSR continue lopressor  and eliquis  CHADVASC 2 HLD:  continue crestor  LDL 83 10/31/22 get labs from primary  GERD/Hernia:  post surgical repair continue protonix  F/U Sammi Crick DM:  Discussed low carb diet.  Target hemoglobin A1c is 6.5 or less.  Continue current medications. Prostate:  some urgency and erectile dysfunction Cautioned on use of Tadalafil  post prostatectomy F/U McKenzie    Pre cath labs Post cath visit 4 weeks  F/U in a year   Signed: Janelle Mediate 06/09/2023, 3:07 PM

## 2023-06-09 NOTE — Progress Notes (Signed)
 CARDIOLOGY CONSULT NOTE       Patient ID: James Norris MRN: [id number] DOB/AGE: 74-06-1950 74 y.o.   Primary Physician: Kathyleen Parkins, MD Primary Cardiologist: Ellakate Gonsalves/Taylor   HPI:  73 y.o. last seen by me 08/23/21 history of Coronary artery calcification on CT, Hyperlipidemia, lung nodules, GERD, OSA, HLD, moderate hiatal hernia, pulmonary hypertension, history of prostate surgery.    He stopped smoking 8 years ago but smoked 3 ppd prior to this    Cardiac CTA 10/17/17 calcium  score 221 no obstructive CAD 1-24%  in vessels  Myovue 12/24/19 with no ischemia EF 55-60% TTE  03/2021 EF 60-65% normal RV no pulmonary HTN mild LAE No signs of pulmonary HTN  Medicare screening no AAA 03/03/20    CTA 04/08/19 no PE moderate hiatal hernia 10 mm LLL nodule stable since 2019 PET negative at that time    Taking statin 3x/week with LDL 88 Dyspnea with fatigue and exercise intolerance COVID positive 02/03/20   Since then followed by PA and Carolynne Citron for PAF. CHADVASC 2 for vascular dx and age Rx with eliquis  and lopressor  Been in NSR   Updated cardiac CTA done 12/19/22 reviewed calcium  score 382 , 62 nd percentile  Total plaque volume 811 mm3  50-69% LCX dx FFR was positive in LCX at 0.85 mid and 0.61 distal Was not having chest pain   Had hernia repair with Dr Larrie Po 01/03/23 cleared by PA  Had left  shoulder injections with Dr Phyllis Breeze 05/12/23 Prior   Has exertional dyspnea and feels poorly at altitude in [place]  ? With more PAF and Chest tightness  Shared Decision Making/Informed Consent The risks [stroke (1 in 1000), death (1 in 1000), kidney failure [usually temporary] (1 in 500), bleeding (1 in 200), allergic reaction [possibly serious] (1 in 200)], benefits (diagnostic support and management of coronary artery disease) and alternatives of a cardiac catheterization were discussed in detail with [name] and she is willing to proceed.   ROS All other systems reviewed and negative  except as noted above  Past Medical History:  Diagnosis Date   Anxiety    GERD (gastroesophageal reflux disease)    Hyperlipemia    Kidney stone    Nephrolithiasis 01/06/2019   Sleep apnea    cpap    Family History  Problem Relation Age of Onset   Dementia Mother    Breast cancer Mother    Coronary artery disease Mother    Coronary artery disease Father    Hypertension Father    Diabetes type II Father    Hyperlipidemia Father    GER disease Father     Social History   Socioeconomic History   Marital status: Married    Spouse name: Not on file   Number of children: Not on file   Years of education: Not on file   Highest education level: Not on file  Occupational History   Not on file  Tobacco Use   Smoking status: Former    Current packs/day: 0.00    Types: Cigarettes    Quit date: 08/27/2006    Years since quitting: 16.7    Passive exposure: Past   Smokeless tobacco: Never   Tobacco comments:    quit 7 yrs ago  Vaping Use   Vaping status: Never Used  Substance and Sexual Activity   Alcohol use: Yes    Alcohol/week: 0.0 standard drinks of alcohol    Comment: social rare   Drug use: No   Sexual activity: Yes  Other Topics Concern   Not on file  Social History Narrative   Not on file   Social Drivers of Health   Financial Resource Strain: Not on file  Food Insecurity: No Food Insecurity (08/31/2021)   Hunger Vital Sign    Worried About Running Out of Food in the Last Year: Never true    Ran Out of Food in the Last Year: Never true  Transportation Needs: No Transportation Needs (08/31/2021)   PRAPARE - Administrator, Civil Service (Medical): No    Lack of Transportation (Non-Medical): No  Physical Activity: Not on file  Stress: Not on file  Social Connections: Unknown (05/21/2021)   Received from [facility]   Social Network    Social Network: Not on file  Intimate Partner Violence: Unknown (04/12/2021)   Received from  [facility]   HITS    Physically Hurt: Not on file    Insult or Talk Down To: Not on file    Threaten Physical Harm: Not on file    Scream or Curse: Not on file    Past Surgical History:  Procedure Laterality Date   BALLOON DILATION N/A 03/11/2012   Procedure: BALLOON DILATION;  Surgeon: Ruby Corporal, MD;  [facility];  Service: Endoscopy;  Laterality: N/A;   COLONOSCOPY WITH ESOPHAGOGASTRODUODENOSCOPY (EGD) N/A 03/11/2012   Procedure: COLONOSCOPY WITH ESOPHAGOGASTRODUODENOSCOPY (EGD);  Surgeon: Ruby Corporal, MD;  [facility];  Service: Endoscopy;  Laterality: N/A;  1200   COLONOSCOPY WITH PROPOFOL  N/A 11/12/2022   Procedure: COLONOSCOPY WITH PROPOFOL ;  Surgeon: Urban Garden, MD;  [facility];  Service: Gastroenterology;  Laterality: N/A;  11:45AM;ASA 3   CYSTOSCOPY W/ RETROGRADES Left 01/25/2019   Procedure: CYSTOSCOPY WITH LEFT RETROGRADE PYELOGRAM;  Surgeon: Marco Severs, MD;  Location: [facility];  Service: Urology;  Laterality: Left;   CYSTOSCOPY W/ URETERAL STENT REMOVAL Left 01/25/2019   Procedure: CYSTOSCOPY WITH LEFT URETERAL STENT REMOVAL;  Surgeon: Marco Severs, MD;  Location: [facility];  Service: Urology;  Laterality: Left;   CYSTOSCOPY/URETEROSCOPY/HOLMIUM LASER/STENT PLACEMENT Left 01/06/2019   Procedure: CYSTOSCOPY/URETEROSCOPY//STENT PLACEMENT WITH LEFT RETROGRADE PYELOGRAM;  Surgeon: Marco Severs, MD;  Location: [facility];  Service: Urology;  Laterality: Left;   ESOPHAGEAL DILATION N/A 02/15/2015   Procedure: ESOPHAGEAL DILATION;  Surgeon: Ruby Corporal, MD;  [facility];  Service: Endoscopy;  Laterality: N/A;   ESOPHAGOGASTRODUODENOSCOPY N/A 02/15/2015   Procedure: ESOPHAGOGASTRODUODENOSCOPY (EGD);  Surgeon: Ruby Corporal, MD;  [facility];  Service: Endoscopy;  Laterality: N/A;  2:00   EXTRACORPOREAL SHOCK WAVE LITHOTRIPSY Left 01/04/2019   Procedure: EXTRACORPOREAL SHOCK  WAVE LITHOTRIPSY (ESWL);  Surgeon: Osborn Blaze, MD;  [facility];  Service: Urology;  Laterality: Left;   EYE SURGERY     lasik- bilaterally   POLYPECTOMY  11/12/2022   Procedure: POLYPECTOMY;  Surgeon: Urban Garden, MD;  [facility];  Service: Gastroenterology;;   TONSILLECTOMY     URETEROSCOPY Left 01/25/2019   Procedure: DIAGNOSTIC LEFT URETEROSCOPY;  Surgeon: Marco Severs, MD;  Location: [facility];  Service: Urology;  Laterality: Left;   XI ROBOTIC ASSISTED SIMPLE PROSTATECTOMY N/A 04/05/2019   Procedure: XI ROBOTIC ASSISTED SIMPLE PROSTATECTOMY;  Surgeon: Marco Severs, MD;  [facility];  Service: Urology;  Laterality: N/A;  3 HRS   XI ROBOTIC ASSISTED VENTRAL HERNIA N/A 01/03/2023   Procedure: XI ROBOTIC ASSISTED VENTRAL HERNIA W/  MESH;  Surgeon: Alanda Allegra, MD;  Location: [facility];  Service: General;  Laterality: N/A;      Current Outpatient Medications:    ALPRAZolam  (XANAX ) 0.5 MG tablet, Take 0.5 mg by mouth 3 (three) times daily as needed for anxiety. , Disp: , Rfl:    apixaban  (ELIQUIS ) 5 MG TABS tablet, Take 1 tablet by mouth twice daily (Patient taking differently: Take 5 mg by mouth daily.), Disp: 180 tablet, Rfl: 1   metoprolol  tartrate (LOPRESSOR ) 25 MG tablet, Take 25 mg by mouth 2 (two) times daily., Disp: , Rfl:    Multiple Vitamin (MULTIVITAMIN WITH MINERALS) TABS tablet, Take 1 tablet by mouth in the morning., Disp: , Rfl:    pantoprazole  (PROTONIX ) 40 MG tablet, TAKE 1 TABLET BY MOUTH ONCE DAILY BEFORE BREAKFAST, Disp: 90 tablet, Rfl: 1   rosuvastatin  (CRESTOR ) 10 MG tablet, TAKE 1 TABLET BY MOUTH ONCE DAILY **NEED  OFFICE  VISIT**, Disp: 90 tablet, Rfl: 3   Semaglutide  (RYBELSUS ) 14 MG TABS, Take 1 tablet (14 mg total) by mouth daily., Disp: 90 tablet, Rfl: 3   Semaglutide  (RYBELSUS ) 3 MG TABS, Take 1 tablet (3 mg total) by mouth daily., Disp: 30 tablet, Rfl: 0   Semaglutide  (RYBELSUS ) 7 MG TABS, Take 1 tablet (7 mg total)  by mouth daily. Start taking once you have completed 3 mg dosing, Disp: 30 tablet, Rfl: 0   sildenafil (REVATIO) 20 MG tablet, Take 20 mg by mouth daily as needed (erectile dysfunction.)., Disp: , Rfl:    tadalafil  (CIALIS ) 20 MG tablet, Take 1 tablet (20 mg total) by mouth daily as needed for erectile dysfunction., Disp: 30 tablet, Rfl: 11    Physical Exam: There were no vitals taken for this visit.    Obese male Lungs clear No murmur Abdomen post robotic hernia repair ubilical Palpable pedal pulses Trace edema   Labs:   Lab Results  Component Value Date   WBC 6.6 06/19/2022   HGB 16.0 06/19/2022   HCT 47.6 06/19/2022   MCV 87 06/19/2022   PLT 240 06/19/2022      Radiology: No results found.  EKG: SR rate 60 poor R wave progression 11/07/22    ASSESSMENT AND PLAN:   CAD: no angina. Cardiac CTA with high plaque volume and positive FFR 12/09/22 Prior myovue 2021 with ? Apical infarct and mild peri infarct ischemia Given symptoms at altitude and dyspnea favor diagnostic cath. Will arrange next week Labs this week Hold eliquis  day before and morning of  PAF:  NSR continue lopressor  and eliquis  CHADVASC 2 HLD:  continue crestor  LDL 83 10/31/22 get labs from primary  GERD/Hernia:  post surgical repair continue protonix  F/U Sammi Crick DM:  Discussed low carb diet.  Target hemoglobin A1c is 6.5 or less.  Continue current medications. Prostate:  some urgency and erectile dysfunction Cautioned on use of Tadalafil  post prostatectomy F/U McKenzie    Pre cath labs Post cath visit 4 weeks  F/U in a year   Signed: Janelle Mediate 06/09/2023, 3:07 PM

## 2023-06-10 ENCOUNTER — Other Ambulatory Visit: Payer: Self-pay | Admitting: Cardiovascular Disease

## 2023-06-10 ENCOUNTER — Ambulatory Visit: Payer: Self-pay | Admitting: Cardiovascular Disease

## 2023-06-10 DIAGNOSIS — Z6837 Body mass index (BMI) 37.0-37.9, adult: Secondary | ICD-10-CM | POA: Diagnosis not present

## 2023-06-10 DIAGNOSIS — E6609 Other obesity due to excess calories: Secondary | ICD-10-CM | POA: Diagnosis not present

## 2023-06-10 DIAGNOSIS — G4733 Obstructive sleep apnea (adult) (pediatric): Secondary | ICD-10-CM | POA: Diagnosis not present

## 2023-06-10 DIAGNOSIS — I209 Angina pectoris, unspecified: Secondary | ICD-10-CM

## 2023-06-10 LAB — BASIC METABOLIC PANEL WITH GFR
BUN/Creatinine Ratio: 14 (ref 10–24)
BUN: 14 mg/dL (ref 8–27)
CO2: 21 mmol/L (ref 20–29)
Calcium: 9 mg/dL (ref 8.6–10.2)
Chloride: 107 mmol/L — ABNORMAL HIGH (ref 96–106)
Creatinine, Ser: 0.97 mg/dL (ref 0.76–1.27)
Glucose: 90 mg/dL (ref 70–99)
Potassium: 4.8 mmol/L (ref 3.5–5.2)
Sodium: 141 mmol/L (ref 134–144)
eGFR: 82 mL/min/{1.73_m2} (ref >59)

## 2023-06-10 LAB — CBC
Hematocrit: 49.1 % (ref 37.5–51.0)
Hemoglobin: 15.8 g/dL (ref 13.0–17.7)
MCH: 28.3 pg (ref 26.6–33.0)
MCHC: 32.2 g/dL (ref 31.5–35.7)
MCV: 88 fL (ref 79–97)
Platelets: 245 10*3/uL (ref 150–450)
RBC: 5.59 x10E6/uL (ref 4.14–5.80)
RDW: 12.8 % (ref 11.6–15.4)
WBC: 5.5 10*3/uL (ref 3.4–10.8)

## 2023-06-13 ENCOUNTER — Telehealth: Payer: Self-pay | Admitting: Internal Medicine

## 2023-06-13 NOTE — Telephone Encounter (Signed)
 Patient wants a call back to confirm if he will need to do additional lab work today.

## 2023-06-13 NOTE — Telephone Encounter (Signed)
 Pt notified that he has completed lab work for heart cath already. Pt reminded to hold Eliquis  for 2 day prior to heart cath.

## 2023-06-16 ENCOUNTER — Telehealth: Payer: Self-pay | Admitting: *Deleted

## 2023-06-16 NOTE — Telephone Encounter (Signed)
 Cardiac Catheterization scheduled at [facility] for: Tuesday June 17, 2023 7:30 AM Arrival time [facility] Main Entrance A at: 5:30 AM  Nothing to eat after midnight prior to procedure, clear liquids until 5 AM day of procedure.  Medication instructions: -Hold:  Eliquis -none 06/15/23 until post procedure -Other usual morning medications can be taken with sips of water  including aspirin 81 mg.  Plan to go home the same day, you will only stay overnight if medically necessary.  You must have responsible adult to drive you home.  Someone must be with you the first 24 hours after you arrive home.  Reviewed procedure instructions with patient.

## 2023-06-17 ENCOUNTER — Other Ambulatory Visit: Payer: Self-pay

## 2023-06-17 ENCOUNTER — Encounter (HOSPITAL_COMMUNITY)
Admission: RE | Disposition: A | Discharge status: Home / Self Care | Payer: Self-pay | Source: Home / Self Care | Attending: Cardiovascular Disease

## 2023-06-17 ENCOUNTER — Encounter (HOSPITAL_COMMUNITY): Payer: Self-pay | Admitting: Cardiovascular Disease

## 2023-06-17 ENCOUNTER — Ambulatory Visit (HOSPITAL_COMMUNITY)
Admission: RE | Admit: 2023-06-17 | Discharge: 2023-06-17 | Disposition: A | Discharge status: Home / Self Care | Attending: Cardiovascular Disease | Admitting: Cardiovascular Disease

## 2023-06-17 DIAGNOSIS — Z87891 Personal history of nicotine dependence: Secondary | ICD-10-CM | POA: Diagnosis not present

## 2023-06-17 DIAGNOSIS — I272 Pulmonary hypertension, unspecified: Secondary | ICD-10-CM | POA: Insufficient documentation

## 2023-06-17 DIAGNOSIS — N529 Male erectile dysfunction, unspecified: Secondary | ICD-10-CM | POA: Diagnosis not present

## 2023-06-17 DIAGNOSIS — I209 Angina pectoris, unspecified: Secondary | ICD-10-CM

## 2023-06-17 DIAGNOSIS — G4733 Obstructive sleep apnea (adult) (pediatric): Secondary | ICD-10-CM | POA: Diagnosis not present

## 2023-06-17 DIAGNOSIS — K219 Gastro-esophageal reflux disease without esophagitis: Secondary | ICD-10-CM | POA: Diagnosis not present

## 2023-06-17 DIAGNOSIS — I2510 Atherosclerotic heart disease of native coronary artery without angina pectoris: Secondary | ICD-10-CM | POA: Diagnosis not present

## 2023-06-17 DIAGNOSIS — E119 Type 2 diabetes mellitus without complications: Secondary | ICD-10-CM | POA: Insufficient documentation

## 2023-06-17 DIAGNOSIS — I480 Paroxysmal atrial fibrillation: Secondary | ICD-10-CM | POA: Insufficient documentation

## 2023-06-17 DIAGNOSIS — E785 Hyperlipidemia, unspecified: Secondary | ICD-10-CM | POA: Diagnosis not present

## 2023-06-17 DIAGNOSIS — Z7901 Long term (current) use of anticoagulants: Secondary | ICD-10-CM | POA: Insufficient documentation

## 2023-06-17 DIAGNOSIS — Z79899 Other long term (current) drug therapy: Secondary | ICD-10-CM | POA: Insufficient documentation

## 2023-06-17 HISTORY — PX: LEFT HEART CATH AND CORONARY ANGIOGRAPHY: CATH118249

## 2023-06-17 SURGERY — LEFT HEART CATH AND CORONARY ANGIOGRAPHY
Anesthesia: LOCAL

## 2023-06-17 MED ORDER — MIDAZOLAM HCL 2 MG/2ML IJ SOLN
INTRAMUSCULAR | Status: AC
  Filled 2023-06-17: qty 2

## 2023-06-17 MED ORDER — SODIUM CHLORIDE 0.9 % IV SOLN: 250.0000 mL | INTRAVENOUS | Status: DC | PRN

## 2023-06-17 MED ORDER — ASPIRIN 81 MG PO CHEW: 81.0000 mg | CHEWABLE_TABLET | ORAL | Status: DC

## 2023-06-17 MED ORDER — ONDANSETRON HCL 4 MG/2ML IJ SOLN: 4.0000 mg | Freq: Four times a day (QID) | INTRAMUSCULAR | Status: DC | PRN

## 2023-06-17 MED ORDER — VERAPAMIL HCL 2.5 MG/ML IV SOLN
INTRAVENOUS | Status: AC
  Filled 2023-06-17: qty 2

## 2023-06-17 MED ORDER — SODIUM CHLORIDE 0.9% FLUSH: 3.0000 mL | INTRAVENOUS | Status: DC | PRN

## 2023-06-17 MED ORDER — FENTANYL CITRATE (PF) 100 MCG/2ML IJ SOLN
INTRAMUSCULAR | Status: DC | PRN
Start: 2023-06-17 — End: 2023-06-17
  Administered 2023-06-17: 50 ug via INTRAVENOUS

## 2023-06-17 MED ORDER — SODIUM CHLORIDE 0.9 % WEIGHT BASED INFUSION: 3.0000 mL/kg/h | INTRAVENOUS | Status: AC

## 2023-06-17 MED ORDER — LIDOCAINE HCL (PF) 1 % IJ SOLN
INTRAMUSCULAR | Status: DC | PRN
Start: 2023-06-17 — End: 2023-06-17
  Administered 2023-06-17: 2 mL

## 2023-06-17 MED ORDER — SODIUM CHLORIDE 0.9 % IV SOLN: INTRAVENOUS | Status: DC

## 2023-06-17 MED ORDER — FENTANYL CITRATE (PF) 100 MCG/2ML IJ SOLN
INTRAMUSCULAR | Status: AC
  Filled 2023-06-17: qty 2

## 2023-06-17 MED ORDER — SODIUM CHLORIDE 0.9 % WEIGHT BASED INFUSION: 1.0000 mL/kg/h | INTRAVENOUS | Status: DC

## 2023-06-17 MED ORDER — LIDOCAINE HCL (PF) 1 % IJ SOLN
INTRAMUSCULAR | Status: AC
  Filled 2023-06-17: qty 30

## 2023-06-17 MED ORDER — HEPARIN (PORCINE) IN NACL 2-0.9 UNITS/ML
INTRAMUSCULAR | Status: DC | PRN
  Administered 2023-06-17: 10 mL via INTRA_ARTERIAL

## 2023-06-17 MED ORDER — ACETAMINOPHEN 325 MG PO TABS
650.0000 mg | ORAL_TABLET | ORAL | Status: DC | PRN
Start: 2023-06-17 — End: 2023-06-17

## 2023-06-17 MED ORDER — MIDAZOLAM HCL 2 MG/2ML IJ SOLN
INTRAMUSCULAR | Status: DC | PRN
Start: 2023-06-17 — End: 2023-06-17
  Administered 2023-06-17: 2 mg via INTRAVENOUS

## 2023-06-17 MED ORDER — HYDRALAZINE HCL 20 MG/ML IJ SOLN: 10.0000 mg | INTRAMUSCULAR | Status: DC | PRN

## 2023-06-17 MED ORDER — IOHEXOL 350 MG/ML SOLN
INTRAVENOUS | Status: DC | PRN
  Administered 2023-06-17: 60 mL via INTRA_ARTERIAL

## 2023-06-17 MED ORDER — HEPARIN (PORCINE) IN NACL 1000-0.9 UT/500ML-% IV SOLN
INTRAVENOUS | Status: DC | PRN
Start: 2023-06-17 — End: 2023-06-17
  Administered 2023-06-17 (×2): 500 mL via INTRA_ARTERIAL

## 2023-06-17 MED ORDER — LABETALOL HCL 5 MG/ML IV SOLN
10.0000 mg | INTRAVENOUS | Status: DC | PRN
  Administered 2023-06-17: 10 mg via INTRAVENOUS
  Filled 2023-06-17: qty 4

## 2023-06-17 MED ORDER — HEPARIN SODIUM (PORCINE) 1000 UNIT/ML IJ SOLN
INTRAMUSCULAR | Status: DC | PRN
  Administered 2023-06-17: 6000 [IU] via INTRAVENOUS

## 2023-06-17 MED ORDER — SODIUM CHLORIDE 0.9% FLUSH: 3.0000 mL | Freq: Two times a day (BID) | INTRAVENOUS | Status: DC

## 2023-06-17 MED ORDER — HEPARIN SODIUM (PORCINE) 1000 UNIT/ML IJ SOLN
INTRAMUSCULAR | Status: AC
  Filled 2023-06-17: qty 10

## 2023-06-17 SURGICAL SUPPLY — 7 items
CATH 5FR JL3.5 JR4 ANG PIG MP (CATHETERS) IMPLANT
DEVICE RAD COMP TR BAND LRG (VASCULAR PRODUCTS) IMPLANT
GLIDESHEATH SLEND SS 6F .021 (SHEATH) IMPLANT
GUIDEWIRE INQWIRE 1.5J.035X260 (WIRE) IMPLANT
KIT SYRINGE INJ CVI SPIKEX1 (MISCELLANEOUS) IMPLANT
PACK CARDIAC CATHETERIZATION (CUSTOM PROCEDURE TRAY) ×1 IMPLANT
SET ATX-X65L (MISCELLANEOUS) IMPLANT

## 2023-06-17 NOTE — Discharge Instructions (Addendum)
-  Resume Eliquis tomorrow

## 2023-06-17 NOTE — Interval H&P Note (Signed)
 History and Physical Interval Note:  06/17/2023 7:51 AM  James Norris  has presented today for surgery, with the diagnosis of abnormal cardiac ct, angina.  The various methods of treatment have been discussed with the patient and family. After consideration of risks, benefits and other options for treatment, the patient has consented to  Procedure(s): LEFT HEART CATH AND CORONARY ANGIOGRAPHY (N/A) as a surgical intervention.  The patient's history has been reviewed, patient examined, no change in status, stable for surgery.  I have reviewed the patient's chart and labs.  Questions were answered to the patient's satisfaction.    Cath Lab Visit (complete for each Cath Lab visit)  Clinical Evaluation Leading to the Procedure:   ACS: No.  Non-ACS:    Anginal Classification: CCS III  Anti-ischemic medical therapy: Minimal Therapy (1 class of medications)  Non-Invasive Test Results: Intermediate-risk stress test findings: cardiac mortality 1-3%/year (coronary CTA with moderate CAD)  Prior CABG: No previous CABG        Antoinette Batman

## 2023-06-18 ENCOUNTER — Telehealth (INDEPENDENT_AMBULATORY_CARE_PROVIDER_SITE_OTHER): Payer: Self-pay

## 2023-06-18 ENCOUNTER — Encounter: Payer: Self-pay | Admitting: Cardiovascular Disease

## 2023-06-18 NOTE — Telephone Encounter (Signed)
 Thanks

## 2023-06-18 NOTE — Telephone Encounter (Signed)
 Patient is in need of us  to fill out forms to [facility] and give him a script for zepbound. He will bring the forms by the office tomorrow and explain what he needs us  to do.

## 2023-06-19 NOTE — Telephone Encounter (Signed)
 Thanks

## 2023-06-19 NOTE — Telephone Encounter (Signed)
 Patient brought the forms by the office. I have placed these on your desk. Once signed I will fax to the drug company and I will make the patient aware so he can follow up with them to set up payment and shipment of the medications.

## 2023-06-23 ENCOUNTER — Other Ambulatory Visit (INDEPENDENT_AMBULATORY_CARE_PROVIDER_SITE_OTHER): Payer: Self-pay

## 2023-06-23 NOTE — Telephone Encounter (Signed)
 Faxed the order form to the patient.

## 2023-06-23 NOTE — Telephone Encounter (Signed)
 Thanks for your help.

## 2023-06-23 NOTE — Telephone Encounter (Signed)
 I made the patient aware that the script was faxed today and I did get a confirmation on this. I made him aware I have mailed the original script with the fax confirmation to him. Patient states understanding and says Thank You!

## 2023-06-23 NOTE — Telephone Encounter (Signed)
 Original mailed to the patient 06/23/2023 with the fax confirmation.

## 2023-06-23 NOTE — Telephone Encounter (Signed)
 This message was sent via FAXCOM, a product from [employer]. http://www.biscom.com/                    -------Fax Transmission Report-------  To:               Recipient at [phone] Subject:          [place] Zepbound script Result:           The transmission was successful. Explanation:      All Pages Ok Pages Sent:       3 Connect Time:     2 minutes, 22 seconds Transmit Time:    06/23/2023 08:56 Transfer Rate:    14400 Status Code:      0000 Retry Count:      0 Job Id:           [id number] Unique Id:        [id number] Fax Line:         23 Fax Server:       [id number]

## 2023-08-27 ENCOUNTER — Telehealth (INDEPENDENT_AMBULATORY_CARE_PROVIDER_SITE_OTHER): Payer: Self-pay

## 2023-08-27 NOTE — Telephone Encounter (Signed)
 Patient called today states, left a vm on nurse line he needs Zepbound increased to .5 from .25. Forms on your desk. I have called and left the patient a Vm that I got his message. Forms on your desk, once signed I will fax this to [employer].

## 2023-08-30 NOTE — Telephone Encounter (Signed)
Thanks, forms have been signed

## 2023-09-01 ENCOUNTER — Ambulatory Visit: Attending: Internal Medicine | Admitting: Internal Medicine

## 2023-09-01 ENCOUNTER — Encounter: Payer: Self-pay | Admitting: Internal Medicine

## 2023-09-01 VITALS — BP 133/77 | HR 70 | Ht 71.5 in | Wt 256.0 lb

## 2023-09-01 DIAGNOSIS — I480 Paroxysmal atrial fibrillation: Secondary | ICD-10-CM

## 2023-09-01 NOTE — Patient Instructions (Signed)

## 2023-09-01 NOTE — Telephone Encounter (Signed)
 New script faxed 09/01/2023, by CS.  To:               Recipient at [phone] Subject:          New script for Zepbound for J Geffrard Result:           The transmission was successful. Explanation:      All Pages Ok Pages Sent:       3 Connect Time:     2 minutes, 36 seconds Transmit Time:    09/01/2023 07:54 Transfer Rate:    14400 Status Code:      0000 Retry Count:      0 Job Id:           [id number] Unique Id:        [id number] Fax Line:         54 Fax Server:       [id number]

## 2023-09-01 NOTE — Progress Notes (Signed)
 HPI [name] returns today for followup of atrial fib. He is a pleasant 73 yo man with a h/o PAF, coronary calcification, dyslipidemia and remote tobacco abuse, stopping over 5 years ago. He wears an Apple watch which has shown periods of atrial fib for which he is minimally if at all symptomatic. The patient has a h/o sleep apnea, and has been on CPAP. He admits to dietary indiscretion. He has preserved LV dysfunction.  No Known Allergies   Current Outpatient Medications  Medication Sig Dispense Refill   ALPRAZolam  (XANAX ) 0.5 MG tablet Take 0.5 mg by mouth 3 (three) times daily as needed for anxiety.      apixaban  (ELIQUIS ) 5 MG TABS tablet Take 1 tablet by mouth twice daily (Patient taking differently: Take 5 mg by mouth daily. Pt takes ONLY once a day) 180 tablet 1   metoprolol  tartrate (LOPRESSOR ) 25 MG tablet Take 25 mg by mouth 2 (two) times daily.     Multiple Vitamin (MULTIVITAMIN WITH MINERALS) TABS tablet Take 1 tablet by mouth in the morning.     pantoprazole  (PROTONIX ) 40 MG tablet TAKE 1 TABLET BY MOUTH ONCE DAILY BEFORE BREAKFAST 90 tablet 1   rosuvastatin  (CRESTOR ) 10 MG tablet TAKE 1 TABLET BY MOUTH ONCE DAILY **NEED  OFFICE  VISIT** 90 tablet 3   sildenafil (REVATIO) 20 MG tablet Take 20 mg by mouth daily as needed (erectile dysfunction.).     tadalafil  (CIALIS ) 20 MG tablet Take 1 tablet (20 mg total) by mouth daily as needed for erectile dysfunction. 30 tablet 11   tirzepatide (ZEPBOUND) 2.5 MG/0.5ML Pen Inject 2.5 mg into the skin once a week.     No current facility-administered medications for this visit.     Past Medical History:  Diagnosis Date   Anxiety    GERD (gastroesophageal reflux disease)    Hyperlipemia    Kidney stone    Nephrolithiasis 01/06/2019   Sleep apnea    cpap    ROS:   All systems reviewed and negative except as noted in the HPI.   Past Surgical History:  Procedure Laterality Date   BALLOON DILATION N/A 03/11/2012    Procedure: BALLOON DILATION;  Surgeon: Claudis RAYMOND Rivet, MD;  [facility];  Service: Endoscopy;  Laterality: N/A;   COLONOSCOPY WITH ESOPHAGOGASTRODUODENOSCOPY (EGD) N/A 03/11/2012   Procedure: COLONOSCOPY WITH ESOPHAGOGASTRODUODENOSCOPY (EGD);  Surgeon: Claudis RAYMOND Rivet, MD;  [facility]: [facility];  Service: Endoscopy;  Laterality: N/A;  1200   COLONOSCOPY WITH PROPOFOL  N/A 11/12/2022   Procedure: COLONOSCOPY WITH PROPOFOL ;  Surgeon: Eartha Angelia Sieving, MD;  Location: [facility];  Service: Gastroenterology;  Laterality: N/A;  11:45AM;ASA 3   CYSTOSCOPY W/ RETROGRADES Left 01/25/2019   Procedure: CYSTOSCOPY WITH LEFT RETROGRADE PYELOGRAM;  Surgeon: Sherrilee Belvie CROME, MD;  Location: [facility];  Service: Urology;  Laterality: Left;   CYSTOSCOPY W/ URETERAL STENT REMOVAL Left 01/25/2019   Procedure: CYSTOSCOPY WITH LEFT URETERAL STENT REMOVAL;  Surgeon: Sherrilee Belvie CROME, MD;  Location: [facility];  Service: Urology;  Laterality: Left;   CYSTOSCOPY/URETEROSCOPY/HOLMIUM LASER/STENT PLACEMENT Left 01/06/2019   Procedure: CYSTOSCOPY/URETEROSCOPY//STENT PLACEMENT WITH LEFT RETROGRADE PYELOGRAM;  Surgeon: Sherrilee Belvie CROME, MD;  Location: [facility];  Service: Urology;  Laterality: Left;   ESOPHAGEAL DILATION N/A 02/15/2015   Procedure: ESOPHAGEAL DILATION;  Surgeon: Claudis RAYMOND Rivet, MD;  [facility];  Service: Endoscopy;  Laterality: N/A;   ESOPHAGOGASTRODUODENOSCOPY N/A 02/15/2015   Procedure: ESOPHAGOGASTRODUODENOSCOPY (EGD);  Surgeon:  Claudis RAYMOND Rivet, MD;  [facility];  Service: Endoscopy;  Laterality: N/A;  2:00   EXTRACORPOREAL SHOCK WAVE LITHOTRIPSY Left 01/04/2019   Procedure: EXTRACORPOREAL SHOCK WAVE LITHOTRIPSY (ESWL);  Surgeon: Alvaro Hummer, MD;  Location: [facility];  Service: Urology;  Laterality: Left;   EYE SURGERY     lasik- bilaterally   LEFT HEART CATH AND CORONARY ANGIOGRAPHY N/A 06/17/2023   Procedure: LEFT HEART CATH AND CORONARY ANGIOGRAPHY;  Surgeon:  Verlin Lonni BIRCH, MD;  Location: [facility];  Service: Cardiovascular;  Laterality: N/A;   POLYPECTOMY  11/12/2022   Procedure: POLYPECTOMY;  Surgeon: Eartha Angelia Sieving, MD;  [facility];  Service: Gastroenterology;;   TONSILLECTOMY     URETEROSCOPY Left 01/25/2019   Procedure: DIAGNOSTIC LEFT URETEROSCOPY;  Surgeon: Sherrilee Belvie CROME, MD;  Location: [facility];  Service: Urology;  Laterality: Left;   XI ROBOTIC ASSISTED SIMPLE PROSTATECTOMY N/A 04/05/2019   Procedure: XI ROBOTIC ASSISTED SIMPLE PROSTATECTOMY;  Surgeon: Sherrilee Belvie CROME, MD;  Location: [facility];  Service: Urology;  Laterality: N/A;  3 HRS   XI ROBOTIC ASSISTED VENTRAL HERNIA N/A 01/03/2023   Procedure: XI ROBOTIC ASSISTED VENTRAL HERNIA W/ MESH;  Surgeon: Mavis Anes, MD;  Location: [facility];  Service: General;  Laterality: N/A;     Family History  Problem Relation Age of Onset   Dementia Mother    Breast cancer Mother    Coronary artery disease Mother    Coronary artery disease Father    Hypertension Father    Diabetes type II Father    Hyperlipidemia Father    GER disease Father      Social History   Socioeconomic History   Marital status: Married    Spouse name: Not on file   Number of children: Not on file   Years of education: Not on file   Highest education level: Not on file  Occupational History   Not on file  Tobacco Use   Smoking status: Former    Current packs/day: 0.00    Types: Cigarettes    Quit date: 08/27/2006    Years since quitting: 17.0    Passive exposure: Past   Smokeless tobacco: Never   Tobacco comments:    quit 7 yrs ago  Vaping Use   Vaping status: Never Used  Substance and Sexual Activity   Alcohol use: Yes    Alcohol/week: 0.0 standard drinks of alcohol    Comment: social rare   Drug use: No   Sexual activity: Yes  Other Topics Concern   Not on file  Social History Narrative   Not on file   Social Drivers of Health   Financial  Resource Strain: Not on file  Food Insecurity: No Food Insecurity (08/31/2021)   Hunger Vital Sign    Worried About Running Out of Food in the Last Year: Never true    Ran Out of Food in the Last Year: Never true  Transportation Needs: No Transportation Needs (08/31/2021)   PRAPARE - Administrator, Civil Service (Medical): No    Lack of Transportation (Non-Medical): No  Physical Activity: Not on file  Stress: Not on file  Social Connections: Unknown (05/21/2021)   Received from [facility]   Social Network    Social Network: Not on file  Intimate Partner Violence: Unknown (04/12/2021)   Received from [facility]   HITS    Physically Hurt: Not on file    Insult or Talk Down To:  Not on file    Threaten Physical Harm: Not on file    Scream or Curse: Not on file     BP 133/77 (BP Location: Right Arm, Cuff Size: Large)   Pulse 70   Ht 5' 11.5 (1.816 m)   Wt 256 lb (116.1 kg)   SpO2 96%   BMI 35.21 kg/m   Physical Exam:  Well appearing NAD HEENT: Unremarkable Neck:  No JVD, no thyromegally Lymphatics:  No adenopathy Back:  No CVA tenderness Lungs:  Clear with no wheezes HEART:  Regular rate rhythm, no murmurs, no rubs, no clicks Abd:  soft, positive bowel sounds, no organomegally, no rebound, no guarding Ext:  2 plus pulses, no edema, no cyanosis, no clubbing Skin:  No rashes no nodules Neuro:  CN II through XII intact, motor grossly intact  DEVICE  Normal device function.  See PaceArt for details.   Assess/Plan:  PAF - he is asymptomatic. He will continue his eliquis .  HTN - his bp is controlled today. No change Coags - he has not had any bleeding on eliquis . Obesity - his weight is unchanged from his last visit. He plans to start walking more as the weather cools.   Danelle Samuele Storey,MD

## 2023-09-01 NOTE — Telephone Encounter (Signed)
 I Spoke with the patient and made him aware we sent the new script over this morning. Patient states understanding.

## 2023-09-05 ENCOUNTER — Ambulatory Visit: Admitting: Internal Medicine

## 2023-09-12 ENCOUNTER — Telehealth: Payer: Self-pay

## 2023-09-12 NOTE — Telephone Encounter (Signed)
 Copied from CRM #[id number]. Topic: Appointments - Scheduling Inquiry for Clinic >> Sep 12, 2023  1:10 PM Charlet HERO wrote: Reason for CRM: Wife is patient with Dr Alphonsa is his wife's Dr and would like to  become a patient. Wife is James Norris

## 2023-09-12 NOTE — Telephone Encounter (Signed)
 I am willing to take him on Please talk with patient Please see is he is in need of a appointment relatively soon or later this fall question mark etc. Will need to see for the patient when he will need to be seen because we would have to accommodate the schedule or flux some

## 2023-10-01 ENCOUNTER — Telehealth: Payer: Self-pay

## 2023-10-01 NOTE — Telephone Encounter (Signed)
 Nurses-this is a rather confusing He would be a week patient here-his wife comes here and is one of my patients I will be willing to see him The note from the nurse stated  in immediate distress?  We need to find out what is going on with the patient what is he need to be seen for so we can best know how to help him.  I am willing to pitch in and help out if there is something urgent please feel free to gather more information and then run it by me thank you

## 2023-10-01 NOTE — Telephone Encounter (Signed)
 Copied from CRM #[id number]. Topic: Appointments - Scheduling Inquiry for Clinic >> Oct 01, 2023 10:48 AM Rosaria BRAVO wrote: Reason for CRM: Pt returned the call reporting that he is in immediate distress and is willing to be seen later this fall. Please advise, prefers after mid November.   Best contact: [phone]   ----------------------------------------------------------------------- From previous Reason for Contact - Scheduling: Patient/patient representative is calling to schedule an appointment. Refer to attachments for appointment information.

## 2023-10-01 NOTE — Telephone Encounter (Signed)
 Patient said he stated he was NOT in any immediate distress and [place] and just needed it after that

## 2023-10-01 NOTE — Telephone Encounter (Signed)
 James Norris Please assist him with getting an appointment with me later in November or early December-I realize my slots are filling up This is the husband of one of my patients If he is interested in doing labs before the office visit I can order lab work otherwise we will order the lab work when he comes for his visit Let me know if I need to do anything thank you

## 2023-10-22 ENCOUNTER — Encounter: Payer: Self-pay | Admitting: *Deleted

## 2023-10-22 ENCOUNTER — Encounter (INDEPENDENT_AMBULATORY_CARE_PROVIDER_SITE_OTHER): Payer: Self-pay | Admitting: Gastroenterology

## 2023-10-22 NOTE — Telephone Encounter (Signed)
Appointment scheduled . Patient notified via my chart.

## 2023-12-04 ENCOUNTER — Other Ambulatory Visit: Payer: Self-pay | Admitting: Medical Genetics

## 2023-12-10 ENCOUNTER — Ambulatory Visit: Payer: Self-pay | Admitting: Family Medicine

## 2023-12-18 ENCOUNTER — Other Ambulatory Visit (HOSPITAL_COMMUNITY)
Admission: RE | Admit: 2023-12-18 | Discharge: 2023-12-18 | Disposition: A | Discharge status: Home / Self Care | Payer: Self-pay | Source: Ambulatory Visit | Attending: Oncology | Admitting: Oncology

## 2023-12-21 ENCOUNTER — Encounter: Payer: Self-pay | Admitting: Internal Medicine

## 2023-12-21 ENCOUNTER — Encounter (INDEPENDENT_AMBULATORY_CARE_PROVIDER_SITE_OTHER): Payer: Self-pay | Admitting: Gastroenterology

## 2023-12-22 ENCOUNTER — Other Ambulatory Visit (INDEPENDENT_AMBULATORY_CARE_PROVIDER_SITE_OTHER): Payer: Self-pay

## 2023-12-22 MED ORDER — ROSUVASTATIN CALCIUM 10 MG PO TABS: 10.0000 mg | ORAL_TABLET | Freq: Every day | ORAL | 2 refills | Status: DC

## 2023-12-22 MED ORDER — PANTOPRAZOLE SODIUM 40 MG PO TBEC: 40.0000 mg | DELAYED_RELEASE_TABLET | Freq: Every day | ORAL | 1 refills | Status: DC

## 2023-12-28 LAB — GENECONNECT MOLECULAR SCREEN: Genetic Analysis Overall Interpretation: NEGATIVE

## 2024-01-20 ENCOUNTER — Telehealth (INDEPENDENT_AMBULATORY_CARE_PROVIDER_SITE_OTHER): Payer: Self-pay | Admitting: Gastroenterology

## 2024-01-20 NOTE — Telephone Encounter (Signed)
 Pt left voicemail stating that he is needing his Zepbound increased to 0.75. Currently he is taking 0.5. Pt states Dr.Castaneda has been prescribing this med.  Pt has not been seen since March 2025. Also since Dr.Castaneda is now longer here, pt will need to come into office. I have left a detailed message for pt to call office and set up appointment.

## 2024-01-22 ENCOUNTER — Encounter (INDEPENDENT_AMBULATORY_CARE_PROVIDER_SITE_OTHER): Payer: Self-pay | Admitting: Gastroenterology

## 2024-01-22 ENCOUNTER — Ambulatory Visit (INDEPENDENT_AMBULATORY_CARE_PROVIDER_SITE_OTHER): Admitting: Gastroenterology

## 2024-01-22 ENCOUNTER — Encounter (INDEPENDENT_AMBULATORY_CARE_PROVIDER_SITE_OTHER): Payer: Self-pay

## 2024-01-22 ENCOUNTER — Telehealth (INDEPENDENT_AMBULATORY_CARE_PROVIDER_SITE_OTHER): Payer: Self-pay

## 2024-01-22 VITALS — BP 127/70 | HR 64 | Temp 97.5°F | Ht 71.5 in | Wt 252.6 lb

## 2024-01-22 DIAGNOSIS — Z6839 Body mass index (BMI) 39.0-39.9, adult: Secondary | ICD-10-CM

## 2024-01-22 DIAGNOSIS — E669 Obesity, unspecified: Secondary | ICD-10-CM | POA: Diagnosis not present

## 2024-01-22 DIAGNOSIS — K219 Gastro-esophageal reflux disease without esophagitis: Secondary | ICD-10-CM | POA: Diagnosis not present

## 2024-01-22 DIAGNOSIS — K2100 Gastro-esophageal reflux disease with esophagitis, without bleeding: Secondary | ICD-10-CM

## 2024-01-22 NOTE — Telephone Encounter (Signed)
 Patient came in to the office today to have his Zepbound increased to 7.5mg  once per week.   Chelsea saw the patient and signed the prescription form and I have faxed the form to [employer] and given the patient a copy of the prescription.

## 2024-01-22 NOTE — Progress Notes (Signed)
 "  Referring Provider: Bertell Satterfield, MD Primary Care Physician:  Alphonsa Glendia LABOR, MD Primary GI Physician:   Chief Complaint  Patient presents with   Medication Refill    Pt arrives due to needing refill on Zepbound   HPI:   James Norris is a 74 y.o. male with past medical history of anxiety, GERD, hyperlipidemia, kidney stones and OSA, obesity  Patient presenting today for:  Follow up of GERD and obesity on zepbound   Last seen march 2025 by Dr. Eartha, at that time taking protonix  40mg  daily with good results, on wegovy but stopped being covered by insurance   Recommended to start rybelsus  1.5mg  weekly for 4 weeks then increase to 2 pills x4 weeks then 3 pills x4 weeks then 4 pills weekly thereafter, continue protonix   Started on zepbound in April as rybelsus  not covered  Present: Doing well today. GERD is well controlled pantoprazole  40mg  daily. Doing well on this, no dysphagia or odynophagia.   States he is doing okay on zepbound, feels appetite has decreased some and does feel he has lost some weight though does not think it has gone down as much as he would like for it to. He has not weighed himself recently. Appears to be down about 23 pounds. Currently on 5mg  weekly. He has some diarrhea on occasion but no constipation. No nausea or vomiting. Has not really changed anything about how he eats but is eating less. He likes fried foods and sweets.    No red flag symptoms. Patient denies melena, hematochezia, nausea, vomiting, diarrhea, constipation, dysphagia, odyonophagia, early satiety or weight loss.   Last Colonoscopy:11/12/22 - Four 3 to 5 mm polyps in the transverse colon and in the ascending colon, removed with a cold snare. Resected and retrieved. - Diverticulosis in the sigmoid colon. - Non- bleeding internal hemorrhoids.   Repeat colonoscopy in 3 years for surveillance  Past Medical History:  Diagnosis Date   Anxiety    GERD (gastroesophageal reflux disease)     Hyperlipemia    Kidney stone    Nephrolithiasis 01/06/2019   Sleep apnea    cpap    Past Surgical History:  Procedure Laterality Date   BALLOON DILATION N/A 03/11/2012   Procedure: BALLOON DILATION;  Surgeon: Claudis RAYMOND Rivet, MD;  [facility];  Service: Endoscopy;  Laterality: N/A;   COLONOSCOPY WITH ESOPHAGOGASTRODUODENOSCOPY (EGD) N/A 03/11/2012   Procedure: COLONOSCOPY WITH ESOPHAGOGASTRODUODENOSCOPY (EGD);  Surgeon: Claudis RAYMOND Rivet, MD;  [facility]: [facility];  Service: Endoscopy;  Laterality: N/A;  1200   COLONOSCOPY WITH PROPOFOL  N/A 11/12/2022   Procedure: COLONOSCOPY WITH PROPOFOL ;  Surgeon: Eartha Angelia Sieving, MD;  [facility]: [facility];  Service: Gastroenterology;  Laterality: N/A;  11:45AM;ASA 3   CYSTOSCOPY W/ RETROGRADES Left 01/25/2019   Procedure: CYSTOSCOPY WITH LEFT RETROGRADE PYELOGRAM;  Surgeon: Sherrilee Belvie CROME, MD;  Location: [facility];  Service: Urology;  Laterality: Left;   CYSTOSCOPY W/ URETERAL STENT REMOVAL Left 01/25/2019   Procedure: CYSTOSCOPY WITH LEFT URETERAL STENT REMOVAL;  Surgeon: Sherrilee Belvie CROME, MD;  Location: [facility];  Service: Urology;  Laterality: Left;   CYSTOSCOPY/URETEROSCOPY/HOLMIUM LASER/STENT PLACEMENT Left 01/06/2019   Procedure: CYSTOSCOPY/URETEROSCOPY//STENT PLACEMENT WITH LEFT RETROGRADE PYELOGRAM;  Surgeon: Sherrilee Belvie CROME, MD;  Location: [facility];  Service: Urology;  Laterality: Left;   ESOPHAGEAL DILATION N/A 02/15/2015   Procedure: ESOPHAGEAL DILATION;  Surgeon: Claudis RAYMOND Rivet, MD;  [facility];  Service: Endoscopy;  Laterality: N/A;   ESOPHAGOGASTRODUODENOSCOPY N/A  02/15/2015   Procedure: ESOPHAGOGASTRODUODENOSCOPY (EGD);  Surgeon: Claudis RAYMOND Rivet, MD;  [facility];  Service: Endoscopy;  Laterality: N/A;  2:00   EXTRACORPOREAL SHOCK WAVE LITHOTRIPSY Left 01/04/2019   Procedure: EXTRACORPOREAL SHOCK WAVE LITHOTRIPSY (ESWL);  Surgeon: Alvaro Hummer, MD;  Location: [facility];  Service: Urology;   Laterality: Left;   EYE SURGERY     lasik- bilaterally   LEFT HEART CATH AND CORONARY ANGIOGRAPHY N/A 06/17/2023   Procedure: LEFT HEART CATH AND CORONARY ANGIOGRAPHY;  Surgeon: Verlin Lonni BIRCH, MD;  [facility]: [facility];  Service: Cardiovascular;  Laterality: N/A;   POLYPECTOMY  11/12/2022   Procedure: POLYPECTOMY;  Surgeon: Eartha Angelia Sieving, MD;  [facility];  Service: Gastroenterology;;   TONSILLECTOMY     URETEROSCOPY Left 01/25/2019   Procedure: DIAGNOSTIC LEFT URETEROSCOPY;  Surgeon: Sherrilee Belvie CROME, MD;  Location: [facility];  Service: Urology;  Laterality: Left;   XI ROBOTIC ASSISTED SIMPLE PROSTATECTOMY N/A 04/05/2019   Procedure: XI ROBOTIC ASSISTED SIMPLE PROSTATECTOMY;  Surgeon: Sherrilee Belvie CROME, MD;  Location: [facility];  Service: Urology;  Laterality: N/A;  3 HRS   XI ROBOTIC ASSISTED VENTRAL HERNIA N/A 01/03/2023   Procedure: XI ROBOTIC ASSISTED VENTRAL HERNIA W/ MESH;  Surgeon: Mavis Anes, MD;  Location: [facility];  Service: General;  Laterality: N/A;    Current Outpatient Medications  Medication Sig Dispense Refill   ALPRAZolam  (XANAX ) 0.5 MG tablet Take 0.5 mg by mouth 3 (three) times daily as needed for anxiety.      apixaban  (ELIQUIS ) 5 MG TABS tablet Take 1 tablet by mouth twice daily (Patient taking differently: Take 5 mg by mouth daily. Pt takes ONLY once a day) 180 tablet 1   metoprolol  tartrate (LOPRESSOR ) 25 MG tablet Take 25 mg by mouth 2 (two) times daily.     Multiple Vitamin (MULTIVITAMIN WITH MINERALS) TABS tablet Take 1 tablet by mouth in the morning.     pantoprazole  (PROTONIX ) 40 MG tablet Take 1 tablet (40 mg total) by mouth daily before breakfast. 90 tablet 1   rosuvastatin  (CRESTOR ) 10 MG tablet Take 1 tablet (10 mg total) by mouth daily. 90 tablet 2   sildenafil (REVATIO) 20 MG tablet Take 20 mg by mouth daily as needed (erectile dysfunction.).     tadalafil  (CIALIS ) 20 MG tablet Take 1 tablet (20 mg total) by mouth daily  as needed for erectile dysfunction. 30 tablet 11   tirzepatide (ZEPBOUND) 2.5 MG/0.5ML Pen Inject 2.5 mg into the skin once a week.     No current facility-administered medications for this visit.    Allergies as of 01/22/2024   (No Known Allergies)    Social History   Socioeconomic History   Marital status: Married    Spouse name: Not on file   Number of children: Not on file   Years of education: Not on file   Highest education level: Professional school degree (e.g., MD, DDS, DVM, JD)  Occupational History   Not on file  Tobacco Use   Smoking status: Former    Current packs/day: 0.00    Average packs/day: 1.5 packs/day    Types: Cigarettes    Quit date: 08/27/2006    Years since quitting: 17.4    Passive exposure: Past   Smokeless tobacco: Never   Tobacco comments:    quit 7 yrs ago  Vaping Use   Vaping status: Never Used  Substance and Sexual Activity   Alcohol use: Yes    Alcohol/week:  0.0 standard drinks of alcohol    Comment: social rare   Drug use: No   Sexual activity: Yes  Other Topics Concern   Not on file  Social History Narrative   Not on file   Social Drivers of Health   Tobacco Use: Medium Risk (03/31/2023)   Patient History    Smoking Tobacco Use: Former    Smokeless Tobacco Use: Never    Passive Exposure: Past  Physicist, Medical Strain: Low Risk (12/06/2023)   Overall Financial Resource Strain (CARDIA)    Difficulty of Paying Living Expenses: Not hard at all  Food Insecurity: No Food Insecurity (12/06/2023)   Epic    Worried About Programme Researcher, Broadcasting/film/video in the Last Year: Never true    Ran Out of Food in the Last Year: Never true  Transportation Needs: No Transportation Needs (12/06/2023)   Epic    Lack of Transportation (Medical): No    Lack of Transportation (Non-Medical): No  Physical Activity: Insufficiently Active (12/06/2023)   Exercise Vital Sign    Days of Exercise per Week: 1 day    Minutes of Exercise per Session: 60 min  Stress:  Stress Concern Present (12/06/2023)   [employer] of Occupational Health - Occupational Stress Questionnaire    Feeling of Stress: To some extent  Social Connections: Unknown (12/06/2023)   Social Connection and Isolation Panel    Frequency of Communication with Friends and Family: More than three times a week    Frequency of Social Gatherings with Friends and Family: Three times a week    Attends Religious Services: 1 to 4 times per year    Active Member of Clubs or Organizations: Patient declined    Attends Banker Meetings: Not on file    Marital Status: Married  Depression (EYV7-0): Not on file  Alcohol Screen: Low Risk (12/06/2023)   Alcohol Screen    Last Alcohol Screening Score (AUDIT): 1  Housing: Low Risk (12/06/2023)   Epic    Unable to Pay for Housing in the Last Year: No    Number of Times Moved in the Last Year: 0    Homeless in the Last Year: No  Utilities: Not on file  Health Literacy: Not on file    Review of systems General: negative for malaise, night sweats, fever, chills, weight loss Neck: Negative for lumps, goiter, pain and significant neck swelling Resp: Negative for cough, wheezing, dyspnea at rest CV: Negative for chest pain, leg swelling, palpitations, orthopnea GI: denies melena, hematochezia, nausea, vomiting, diarrhea, constipation, dysphagia, odyonophagia, early satiety or unintentional weight loss.  MSK: Negative for joint pain or swelling, back pain, and muscle pain. Derm: Negative for itching or rash Psych: Denies depression, anxiety, memory loss, confusion. No homicidal or suicidal ideation.  Heme: Negative for prolonged bleeding, bruising easily, and swollen nodes. Endocrine: Negative for cold or heat intolerance, polyuria, polydipsia and goiter. Neuro: negative for tremor, gait imbalance, syncope and seizures. The remainder of the review of systems is noncontributory.  Physical Exam: There were no vitals taken for this  visit. General:   Alert and oriented. No distress noted. Pleasant and cooperative.  Head:  Normocephalic and atraumatic. Eyes:  Conjuctiva clear without scleral icterus. Mouth:  Oral mucosa pink and moist. Good dentition. No lesions. Heart: Normal rate and rhythm, s1 and s2 heart sounds present.  Lungs: Clear lung sounds in all lobes. Respirations equal and unlabored. Abdomen:  +BS, soft, non-tender and non-distended. No rebound or guarding. No HSM or  masses noted. Derm: No palmar erythema or jaundice Msk:  Symmetrical without gross deformities. Normal posture. Extremities:  Without edema. Neurologic:  Alert and  oriented x4 Psych:  Alert and cooperative. Normal mood and affect.  Invalid input(s): 6 MONTHS   ASSESSMENT: James Norris is a 73 y.o. male presenting today for follow up of GERD and Obesity on GLP1  GERD well managed on protonix  40mg  daily without breakthrough or dysphagia, will continue with current PPI regimen  Obesity: weight has declined on zepbound, currently on 5mg  weekly and has lost about 23 pounds. Doing well on this without overt side effects, very occasional diarrhea. Has not changed his diet. Will increase dosage to 7.5mg  weekly. He should make me aware if he has any side effects. Should focus on high protein diet, limiting starchy foods and simple carbs, aim for good water  intake.    Plan: -Continue protonix  40mg  daily -increase zepbound to 7.5mg  weekly -focus on high protein diet -limit simple carbs and starches -good water  intake, aim for around 60ml of water  per day  All questions were answered, patient verbalized understanding and is in agreement with plan as outlined above.   Follow Up: 1 year  Kinaya Hilliker L. Aaryana Betke, MSN, APRN, AGNP-C Adult-Gerontology Nurse Practitioner [facility]  "

## 2024-01-22 NOTE — Patient Instructions (Signed)
 It was nice to see you today! Congrats on your upcoming grand baby! We will continue with protonix  40mg  daily We will increase zepbound 7.5mg  to weekly  Please make me aware if you have any adverse side effects from zepbound Focus on high protein diet with low intake of starchy foods and simple carbs (processed breads, cakes, etc) Aim for atleast around 64 oz of water  per day  Follow up 1 year  It was a pleasure to see you today. I want to create trusting relationships with patients and provide genuine, compassionate, and quality care. I truly value your feedback! please be on the lookout for a survey regarding your visit with me today. I appreciate your input about our visit and your time in completing this!    Tanith Dagostino L. Oberon Hehir, MSN, APRN, AGNP-C Adult-Gerontology Nurse Practitioner [facility]

## 2024-01-22 NOTE — Telephone Encounter (Signed)
 This message was sent via FAXCOM, a product from [employer]. [email or web address].biscom.com/                    -------Fax Transmission Report-------  To:               Recipient at [phone] Subject:          new script for Zepbound J Florentino Result:           The transmission was successful. Explanation:      All Pages Ok Pages Sent:       3 Connect Time:     2 minutes, 31 seconds Transmit Time:    01/22/2024 11:25 Transfer Rate:    14400 Status Code:      0000 Retry Count:      0 Job Id:           [id number] Unique Id:        [id number] Fax Line:         37 Fax Server:       [id number]

## 2024-01-30 ENCOUNTER — Encounter: Payer: Self-pay | Admitting: Family Medicine

## 2024-01-30 ENCOUNTER — Ambulatory Visit: Admitting: Family Medicine

## 2024-01-30 VITALS — BP 138/78 | HR 98 | Temp 98.7°F | Ht 71.5 in | Wt 250.5 lb

## 2024-01-30 DIAGNOSIS — I480 Paroxysmal atrial fibrillation: Secondary | ICD-10-CM

## 2024-01-30 DIAGNOSIS — E7800 Pure hypercholesterolemia, unspecified: Secondary | ICD-10-CM

## 2024-01-30 DIAGNOSIS — Z125 Encounter for screening for malignant neoplasm of prostate: Secondary | ICD-10-CM

## 2024-01-30 DIAGNOSIS — K2100 Gastro-esophageal reflux disease with esophagitis, without bleeding: Secondary | ICD-10-CM | POA: Diagnosis not present

## 2024-01-30 DIAGNOSIS — F411 Generalized anxiety disorder: Secondary | ICD-10-CM

## 2024-01-30 DIAGNOSIS — Z79899 Other long term (current) drug therapy: Secondary | ICD-10-CM | POA: Diagnosis not present

## 2024-01-30 DIAGNOSIS — I4891 Unspecified atrial fibrillation: Secondary | ICD-10-CM

## 2024-01-30 MED ORDER — METOPROLOL SUCCINATE ER 50 MG PO TB24: 25.0000 mg | ORAL_TABLET | Freq: Every day | ORAL | 1 refills | Status: AC

## 2024-01-30 MED ORDER — PANTOPRAZOLE SODIUM 40 MG PO TBEC: 40.0000 mg | DELAYED_RELEASE_TABLET | Freq: Every day | ORAL | 1 refills | Status: AC

## 2024-01-30 MED ORDER — ALPRAZOLAM 0.5 MG PO TABS: 0.5000 mg | ORAL_TABLET | Freq: Three times a day (TID) | ORAL | 1 refills | Status: AC | PRN

## 2024-01-30 MED ORDER — APIXABAN 5 MG PO TABS: 5.0000 mg | ORAL_TABLET | Freq: Two times a day (BID) | ORAL | 1 refills | Status: AC

## 2024-01-30 MED ORDER — ROSUVASTATIN CALCIUM 10 MG PO TABS: 10.0000 mg | ORAL_TABLET | Freq: Every day | ORAL | 2 refills | Status: AC

## 2024-01-30 MED ORDER — SILDENAFIL CITRATE 20 MG PO TABS: ORAL_TABLET | ORAL | 3 refills | Status: AC

## 2024-01-30 NOTE — Progress Notes (Signed)
" ° °  Subjective:    Patient ID: James Norris, male    DOB: Feb 01, 1950, 74 y.o.   MRN: [id number]  HPI Patient is here as a new patient   Patient states he is on zepbound and would like to have it as a prescription so his insurance will cover it. But will not know til April or May  Patient uses a c-pap machine for sleep apnea  Very nice patient Has sleep apnea Uses CPAP machine At the same time though he would like to improve to the point where he does not need to be on the machine States no current angina or shortness of breath with activity Currently taking Zepbound to try to help his sleep apnea. We did discuss proper use as well as potential side effects Patient has history of atrial fibs on Eliquis  No bleeding issues Takes his cholesterol medicine regular basis Tries to eat relatively healthy Stays physically active In addition to this has Xanax  that he uses intermittently because of anxiety related issues.  He states it does not cause drowsiness and does not take it with driving Does take his GERD related medicine and keeps his symptoms under good control  Review of Systems     Objective:   Physical Exam General-in no acute distress Eyes-no discharge Lungs-respiratory rate normal, CTA CV-no murmurs Extremities skin warm dry no edema Neuro grossly normal Behavior normal, alert        Assessment & Plan:  1. Hypercholesterolemia (Primary) Check lab work may need to adjust statin to get LDL below 70 and if possible below 55 - Lipid panel  2. Screening PSA (prostate specific antigen) Screening PSA recommended patient does see urology intermittently - PSA  3. Atrial fibrillation, unspecified type (HCC) Under good control change metoprolol  to a once daily preparation because he often forgets to take the second dose also I encouraged him strongly to take Eliquis  twice daily  4. Gastroesophageal reflux disease with esophagitis without hemorrhage Continue PPI  patient benefits from it  5. High risk medication use Labs ordered - Basic metabolic panel with GFR - CBC with Differential/Platelet - Hepatic function panel  6. GAD (generalized anxiety disorder) He states he uses Xanax  intermittently.  We did discuss safe use as of this and not to use it very frequently never to drive with it  7. PAF (paroxysmal atrial fibrillation) (HCC) See above - apixaban  (ELIQUIS ) 5 MG TABS tablet; Take 1 tablet (5 mg total) by mouth 2 (two) times daily.  Dispense: 180 tablet; Refill: 1  Follow-up in 6 months follow-up sooner problems  Patient gets Zepbound through Zepbound direct but eventually he would like to get this through Medicare for his sleep apnea.  He will notify us  when he needs the next refill "

## 2024-02-06 ENCOUNTER — Encounter: Payer: Self-pay | Admitting: Urology

## 2024-02-06 ENCOUNTER — Ambulatory Visit: Payer: PPO | Admitting: Urology

## 2024-02-06 VITALS — BP 123/75 | HR 71

## 2024-02-06 DIAGNOSIS — N5201 Erectile dysfunction due to arterial insufficiency: Secondary | ICD-10-CM | POA: Diagnosis not present

## 2024-02-06 DIAGNOSIS — R3915 Urgency of urination: Secondary | ICD-10-CM | POA: Diagnosis not present

## 2024-02-06 NOTE — Patient Instructions (Signed)
 Alprostadil Injection (Erectile Dysfunction) What is this medication? ALPROSTADIL (al PROS ta dil) treats erectile dysfunction (ED). It works by increasing blood flow to the penis, which helps to maintain an erection. This medicine may be used for other purposes; ask your health care provider or pharmacist if you have questions. COMMON BRAND NAME(S): Caverject, Caverject Impulse, Edex What should I tell my care team before I take this medication? They need to know if you have any of these conditions: An abnormally formed penis Have been advised not to engage in sexual activity Have ever had a painful erection that lasted more than 4 hours Heart problems Leukemia Low blood pressure Penile implant Sickle cell disease or trait Tumor of the bone marrow (multiple myeloma) An unusual or allergic reaction to alprostadil or other medications, foods, dyes, or preservatives How should I use this medication? This medication is injected into the penis. You will be taught how to prepare and give it. Use exactly as directed. Make sure that the needle is not bent before using this medication. Do not try to straighten or use bent needles. Do not take your medication more often than directed. It is important that you put your used needles and syringes in a special sharps container. Do not put them in a trash can. If you do not have a sharps container, call your pharmacist or care team to get one. Talk to your care team about the use of this medication in children. It is not approved for use in children. Overdosage: If you think you have taken too much of this medicine contact a poison control center or emergency room at once. NOTE: This medicine is only for you. Do not share this medicine with others. What if I miss a dose? You should not use this medication more than 3 times a week. Each dose should be given at least 24 hours apart. What may interact with this medication? Medications for blood  pressure This list may not describe all possible interactions. Give your health care provider a list of all the medicines, herbs, non-prescription drugs, or dietary supplements you use. Also tell them if you smoke, drink alcohol, or use illegal drugs. Some items may interact with your medicine. What should I watch for while using this medication? Contact your care team right away if you have an erection that lasts longer than 4 hours or if it becomes painful. This may be a sign of a serious problem and must be treated right away to prevent permanent damage. Do not change the dose of your medication. Call your care team to determine if your dose needs to be changed. Using this medication does not protect you or your partner against HIV infection or other sexually transmitted infections (STIs). What side effects may I notice from receiving this medication? Side effects that you should report to your care team as soon as possible: Allergic reactions--skin rash, itching, hives, swelling of the face, lips, tongue, or throat Hardening or thickening of tissue at injection site Heart attack--pain or tightness in the chest, shoulders, arms, or jaw, nausea, shortness of breath, cold or clammy skin, feeling faint or lightheaded Heart rhythm changes--fast or irregular heartbeat, dizziness, feeling faint or lightheaded, chest pain, trouble breathing Low blood pressure--dizziness, feeling faint or lightheaded, blurry vision Prolonged or painful erection Stroke--sudden numbness or weakness of the face, arm, or leg, trouble speaking, confusion, trouble walking, loss of balance or coordination, dizziness, severe headache, change in vision Side effects that usually do not require medical  attention (report to your care team if they continue or are bothersome): Pain, redness, irritation, or bruising at the injection site This list may not describe all possible side effects. Call your doctor for medical advice about  side effects. You may report side effects to FDA at 1-800-FDA-1088. Where should I keep my medication? Keep out of the reach of children and pets. Caverject: Store unopened product at or below 25 degrees C (77 degrees F). Do not freeze. See product instructions for storage when product is in use. Different products may have different instructions for storage. Get rid of any unused medication after the expiration date. Edex: Store unopened product between 15 and 30 degrees C (59 and 86 degrees F). Do not freeze. See product instructions for storage when product is in use. Get rid of any unused medication after the expiration date. When traveling, do not store this medication in checked luggage during air travel or leave in a closed automobile. To get rid of medications that are no longer needed or have expired: Take the medication to a medication take-back program. Check with your pharmacy or law enforcement to find a location. If you cannot return the medication, ask your pharmacist or care team how to get rid of this medication safely. NOTE: This sheet is a summary. It may not cover all possible information. If you have questions about this medicine, talk to your doctor, pharmacist, or health care provider.  2025 Elsevier/Gold Standard (2023-08-25 00:00:00)

## 2024-07-14 ENCOUNTER — Ambulatory Visit: Admitting: Family Medicine
